# Patient Record
Sex: Male | Born: 1980
Health system: Southern US, Community
[De-identification: ages and names within clinical notes are randomized; demographics above are authoritative.]

## PROBLEM LIST (undated history)

## (undated) DIAGNOSIS — F1010 Alcohol abuse, uncomplicated: Secondary | ICD-10-CM

## (undated) DIAGNOSIS — Z8739 Personal history of other diseases of the musculoskeletal system and connective tissue: Secondary | ICD-10-CM

---

## 2011-12-28 ENCOUNTER — Other Ambulatory Visit: Payer: Self-pay | Admitting: Orthopedic Surgery

## 2012-01-10 ENCOUNTER — Encounter (HOSPITAL_BASED_OUTPATIENT_CLINIC_OR_DEPARTMENT_OTHER): Admission: RE | Payer: Self-pay | Source: Ambulatory Visit

## 2012-01-10 ENCOUNTER — Ambulatory Visit (HOSPITAL_BASED_OUTPATIENT_CLINIC_OR_DEPARTMENT_OTHER): Admission: RE | Admit: 2012-01-10 | Payer: Self-pay | Source: Ambulatory Visit | Admitting: Orthopedic Surgery

## 2012-01-10 SURGERY — ARTHROSCOPY, SHOULDER, WITH GLENOID LABRUM REPAIR
Anesthesia: Choice | Site: Shoulder | Laterality: Right

## 2015-04-03 ENCOUNTER — Emergency Department
Admission: EM | Admit: 2015-04-03 | Discharge: 2015-04-03 | Disposition: A | Discharge status: Home / Self Care | Payer: BLUE CROSS/BLUE SHIELD | Attending: Emergency Medicine | Admitting: Emergency Medicine

## 2015-04-03 ENCOUNTER — Emergency Department: Payer: BLUE CROSS/BLUE SHIELD

## 2015-04-03 DIAGNOSIS — Y929 Unspecified place or not applicable: Secondary | ICD-10-CM | POA: Insufficient documentation

## 2015-04-03 DIAGNOSIS — S43034A Inferior dislocation of right humerus, initial encounter: Secondary | ICD-10-CM | POA: Diagnosis not present

## 2015-04-03 DIAGNOSIS — Y9301 Activity, walking, marching and hiking: Secondary | ICD-10-CM | POA: Diagnosis not present

## 2015-04-03 DIAGNOSIS — W109XXA Fall (on) (from) unspecified stairs and steps, initial encounter: Secondary | ICD-10-CM | POA: Diagnosis not present

## 2015-04-03 DIAGNOSIS — Y999 Unspecified external cause status: Secondary | ICD-10-CM | POA: Diagnosis not present

## 2015-04-03 DIAGNOSIS — S43004A Unspecified dislocation of right shoulder joint, initial encounter: Secondary | ICD-10-CM

## 2015-04-03 DIAGNOSIS — M25511 Pain in right shoulder: Secondary | ICD-10-CM | POA: Diagnosis present

## 2015-04-03 MED ORDER — PROPOFOL 10 MG/ML IV BOLUS
0.5000 mg/kg | Freq: Once | INTRAVENOUS | Status: AC
  Administered 2015-04-03 (×2): 49.9 mg via INTRAVENOUS

## 2015-04-03 MED ORDER — SODIUM CHLORIDE 0.9 % IV BOLUS (SEPSIS)
1000.0000 mL | Freq: Once | INTRAVENOUS | Status: DC
Start: 2015-04-03 — End: 2015-04-04

## 2015-04-03 MED ORDER — PROPOFOL 1000 MG/100ML IV EMUL
INTRAVENOUS | Status: AC
  Administered 2015-04-03: 49.9 mg via INTRAVENOUS
  Filled 2015-04-03: qty 100

## 2015-04-03 MED ORDER — SODIUM CHLORIDE 0.9 % IV SOLN
INTRAVENOUS | Status: AC | PRN
  Administered 2015-04-03: 1000 mL via INTRAVENOUS

## 2015-04-03 MED ORDER — ONDANSETRON HCL 4 MG/2ML IJ SOLN
4.0000 mg | Freq: Once | INTRAMUSCULAR | Status: AC
  Administered 2015-04-03: 4 mg via INTRAVENOUS
  Filled 2015-04-03: qty 2

## 2015-04-03 MED ORDER — HYDROMORPHONE HCL 1 MG/ML IJ SOLN
1.0000 mg | INTRAMUSCULAR | Status: AC
  Administered 2015-04-03: 1 mg via INTRAVENOUS
  Filled 2015-04-03: qty 1

## 2015-04-03 MED ORDER — KETAMINE HCL 10 MG/ML IJ SOLN: 1.0000 mg/kg | Freq: Once | INTRAMUSCULAR | Status: DC

## 2015-04-03 NOTE — ED Notes (Signed)
Pt reports falling and catching himself, pt has a deformity in his right shoulder.

## 2015-04-03 NOTE — Sedation Documentation (Signed)
MD able to reduce R shoulder, pt tolerated well. Pt remained awake and alert through procedure. Patient is speaking clearly and is A&O x 4.

## 2015-04-03 NOTE — ED Provider Notes (Signed)
[facility] Emergency Department Provider Note  ____________________________________________  Time seen: Approximately 9  PM  I have reviewed the triage vital signs and the nursing notes.   HISTORY  Chief Complaint Shoulder Pain    HPI Andrew Cummings is a 35 y.o. male with a history of multiple shoulder dislocations on the right side was presenting tonight with shoulder dislocation. He says the last time he dislocated it was about 1 year ago. He says he has dislocated it greater than 5 times over his lifetime. He says he has not gotten surgery for this because he said he used to dislocated more frequently when it is playing sports. However, since he was not athletic anymore he decided not to proceed with surgery. He says that about an hour prior to arrival tonight he felt himself slipping downstairs and reached backward to brace himself when he felt his shoulder pop out. He says he tried to force it back in but was unable to. He says that every time he has had a dislocation in the past he has been able to place it back in himself.   No past medical history on file.  There are no active problems to display for this patient.   No past surgical history on file.  No current outpatient prescriptions on file.  Allergies Review of patient's allergies indicates no known allergies.  No family history on file.  Social History Social History  Substance Use Topics  . Smoking status: Not on file  . Smokeless tobacco: Not on file  . Alcohol Use: Not on file    Review of Systems Constitutional: No fever/chills Eyes: No visual changes. ENT: No sore throat. Cardiovascular: Denies chest pain. Respiratory: Denies shortness of breath. Gastrointestinal: No abdominal pain.  No nausea, no vomiting.  No diarrhea.  No constipation. Genitourinary: Negative for dysuria. Musculoskeletal: Negative for back pain. Skin: Negative for rash. Neurological: Negative for  headaches, focal weakness or numbness.  10-point ROS otherwise negative.  ____________________________________________   PHYSICAL EXAM:  VITAL SIGNS: ED Triage Vitals  Enc Vitals Group     BP 04/03/15 2053 141/100 mmHg     Pulse Rate 04/03/15 2053 111     Resp 04/03/15 2053 18     Temp 04/03/15 2053 96.8 F (36 C)     Temp Source 04/03/15 2053 Oral     SpO2 04/03/15 2053 98 %     Weight 04/03/15 2053 220 lb (99.791 kg)     Height 04/03/15 2053  (1.803 m)     Head Cir --      Peak Flow --      Pain Score 04/03/15 2053 10     Pain Loc --      Pain Edu? --      Excl. in GC? --     Constitutional: Alert and oriented.  Mild to moderate distress. Holding his right elbow with his left arm. Eyes: Conjunctivae are normal. PERRL. EOMI. Head: Atraumatic. Nose: No congestion/rhinnorhea. Mouth/Throat: Mucous membranes are moist.  Neck: No stridor.   Cardiovascular: Normal rate, regular rhythm. Grossly normal heart sounds.  Good peripheral circulation. Respiratory: Normal respiratory effort.  No retractions. Lungs CTAB. Gastrointestinal: Soft and nontender. No distention.  Musculoskeletal: Obvious deformity to the right shoulder. He is sensate over the right deltoid. Right radial pulse is intact. Able to grip with full strength with the right hand. Neurologic:  Normal speech and language. No gross focal neurologic deficits are appreciated. No gait instability. Skin:  Skin is warm, dry and intact. No rash noted. Psychiatric: Mood and affect are normal. Speech and behavior are normal.  ____________________________________________   LABS (all labs ordered are listed, but only abnormal results are displayed)  Labs Reviewed - No data to display ____________________________________________  EKG   ____________________________________________  RADIOLOGY   IMPRESSION: Normal alignment at the glenohumeral joint space status post reduction.   Electronically Signed By:  Bary RichardStan Maynard M.D. On: 04/03/2015 22:46  IMPRESSION: Anterior inferior dislocation of right shoulder.   Electronically Signed By: Sherian ReinWei-Chen Lin M.D. On: 04/03/2015 21:44  ____________________________________________   PROCEDURES  Procedural sedation Performed by: Arelia LongestSchaevitz,  Danaja Lasota M Consent: Written consent obtained.  Risks and benefits: risks, benefits and alternatives were discussed Required items: required blood products, implants, devices, and special equipment available Patient identity confirmed: arm band and provided demographic data Time out: Immediately prior to procedure a "time out" was called to verify the correct patient, procedure, equipment, support staff and site/side marked as required.  Sedation type: moderate (conscious) sedation NPO time confirmed and considedered  Sedatives: Propofol   Physician Time at Bedside: 30  Vitals: Vital signs were monitored during sedation. Cardiac Monitor, pulse oximeter Patient tolerance: Patient tolerated the procedure well with no immediate complications. Comments: Pt with uneventful recovered. Returned to pre-procedural sedation baseline  Reduction of dislocation Date/Time: 2200 Performed by: Arelia LongestSchaevitz,  Odalys Win M Authorized by: Arelia LongestSchaevitz,  Chyane Greer M Consent: Written consent obtained.  Risks and benefits: risks, benefits and alternatives were discussed Consent given by: patient Required items: required blood products, implants, devices, and special equipment available Time out: Immediately prior to procedure a "time out" was called to verify the correct patient, procedure, equipment, support staff and site/side marked as required.  Patient sedated: With propofol  Vitals: Vital signs were monitored during sedation. Patient tolerance: Patient tolerated the procedure well with no immediate complications. Joint: Right shoulder Reduction technique: Modified [place]      ____________________________________________   INITIAL IMPRESSION / ASSESSMENT AND PLAN / ED COURSE  Pertinent labs & imaging results that were available during my care of the patient were reviewed by me and considered in my medical decision making (see chart for details).  ----------------------------------------- 11:04 PM on 04/03/2015 -----------------------------------------  Patient tolerated the procedure well was placed in a right-sided shoulder mobilizer. He is completely neurovascularly intact and comfortable. I'll be giving him follow-up with orthopedics. He knows that he must not take of the shoulder mobilizer until he follows up with the orthopedic doctor. Will be discharged home. Understands the plan and willing to comply. ____________________________________________   FINAL CLINICAL IMPRESSION(S) / ED DIAGNOSES  Right shoulder dislocation.    Myrna Blazeravid Matthew Gwendolyn Nishi, MD 04/03/15 [phone]

## 2015-04-03 NOTE — Discharge Instructions (Signed)
Shoulder Dislocation Your shoulder joint is made up of 3 bones:  The upper arm bone (humerus).  The shoulder blade (scapula).  The collarbone (clavicle). A shoulder dislocation happens when your upper arm bone moves out of its normal place in your shoulder joint. HOME CARE If You Have a Splint or Sling:  Wear it as told by your doctor.  Take it off only as told by your doctor.  Loosen it if:  Your fingers become numb and tingly.  Your fingers turn cold and blue.  Keep it clean and dry. Bathing  Do not take baths, swim, or use a hot tub until your doctor says you can. Ask your doctor if you can take showers. You may only be allowed to take sponge baths.  If your doctor says taking baths or showers is okay, cover your splint or sling with a plastic bag. Do not let the splint or sling get wet. Managing Pain, Stiffness, and Swelling  If told, put ice on the injured area.  Put ice in a plastic bag.  Place a towel between your skin and the bag.  Leave the ice on for 20 minutes, 2-3 times per day.  Move your fingers often to avoid stiffness and to lessen swelling.  Raise (elevate) the injured area above the level of your heart while you are sitting or lying down. Driving  Do not drive while you are wearing a splint or sling on a hand that you use for driving.  Do not drive or operate heavy machinery while taking pain medicine. Activity  Return to your normal activities as told by your doctor. Ask your doctor what activities are safe for you.  Do range-of-motion exercises only as told by your doctor.  Exercise your hand by squeezing a soft ball. This keeps your hand and wrist from getting stiff and swollen. General Instructions  Take over-the-counter and prescription medicines only as told by your doctor.  Do not use any tobacco products, including cigarettes, chewing tobacco, or e-cigarettes. Tobacco can slow down healing. If you need help quitting, ask your  doctor.  Keep all follow-up visits as told by your doctor. This is important. GET HELP IF:  Your splint or sling gets damaged. GET HELP RIGHT AWAY IF:  Your pain gets worse instead of better.  You lose feeling in your arm or hand.  Your arm or hand turns white and cold.   This information is not intended to replace advice given to you by your health care provider. Make sure you discuss any questions you have with your health care provider.   Document Released: 03/29/2011 Document Revised: 09/25/2014 Document Reviewed: 04/29/2014 Elsevier Interactive Patient Education 2016 [employer].  How to Use a Shoulder Immobilizer A shoulder immobilizer is a device that you may have to wear after a shoulder injury or surgery. This device keeps your arm from moving. This prevents additional pain or injury. It also supports your arm next to your body as your shoulder heals. You may need to wear a shoulder immobilizer to treat a broken bone (fracture) in your shoulder. You may also need to wear one if you have an injury that moves your shoulder out of position (dislocation). There are different types of shoulder immobilizers. The one that you get depends on your injury. RISKS AND COMPLICATIONS Wearing a shoulder immobilizer in the wrong way can let your injured shoulder move around too much. This may delay healing and make your pain and swelling worse. HOW TO USE  YOUR SHOULDER IMMOBILIZER °· The part of the immobilizer that goes around your neck (sling) should support your upper arm, with your elbow bent and your lower arm and hand across your chest. °· Make sure that your elbow: °¨ Is snug against the back pocket of the sling. °¨ Does not move away from your body. °· The strap of the immobilizer should go over your shoulder and support your arm and hand. Your hand should be slightly higher than your elbow. It should not hang loosely over the edge of the sling. °· If the long strap has a pad, place it  where it is most comfortable on your neck. °· Carefully follow your health care provider's instructions for wearing your shoulder immobilizer. Your health care provider may want you to: °¨ Loosen your immobilizer to straighten your elbow and move your wrist and fingers. You may have to do this several times each day. Ask your health care provider when you should do this and how often. °¨ Remove your immobilizer once every day to shower, but limit the movement in your injured arm. Before putting the immobilizer back on, use a towel to dry the area under your arm completely. °¨ Remove your immobilizer to do shoulder exercises at home as directed by your health care provider. °¨ Wear your immobilizer while you sleep. You may sleep more comfortably if you have your upper body raised on pillows. °SEEK MEDICAL CARE IF: °· Your immobilizer is not supporting your arm properly. °· Your immobilizer gets damaged. °· You have worsening pain or swelling in your shoulder, arm, or hand. °· Your shoulder, arm, or hand changes color or temperature. °· You lose feeling in your shoulder, arm, or hand. °  °This information is not intended to replace advice given to you by your health care provider. Make sure you discuss any questions you have with your health care provider. °  °Document Released: 02/12/2004 Document Revised: 05/21/2014 Document Reviewed: 12/12/2013 °Elsevier Interactive Patient Education ©2016 [employer] ° °

## 2015-04-03 NOTE — ED Notes (Signed)
Pt in with co right shoulder dislocation x 1 hr hx of the same.

## 2016-01-05 DIAGNOSIS — T753XXA Motion sickness, initial encounter: Secondary | ICD-10-CM | POA: Diagnosis not present

## 2016-01-05 DIAGNOSIS — B369 Superficial mycosis, unspecified: Secondary | ICD-10-CM | POA: Diagnosis not present

## 2016-01-05 DIAGNOSIS — Z113 Encounter for screening for infections with a predominantly sexual mode of transmission: Secondary | ICD-10-CM | POA: Diagnosis not present

## 2016-10-14 DIAGNOSIS — L309 Dermatitis, unspecified: Secondary | ICD-10-CM | POA: Diagnosis not present

## 2016-10-14 DIAGNOSIS — R109 Unspecified abdominal pain: Secondary | ICD-10-CM | POA: Diagnosis not present

## 2016-10-14 DIAGNOSIS — R112 Nausea with vomiting, unspecified: Secondary | ICD-10-CM | POA: Diagnosis not present

## 2016-10-14 DIAGNOSIS — Z23 Encounter for immunization: Secondary | ICD-10-CM | POA: Diagnosis not present

## 2017-02-16 DIAGNOSIS — H40023 Open angle with borderline findings, high risk, bilateral: Secondary | ICD-10-CM | POA: Diagnosis not present

## 2017-03-30 DIAGNOSIS — M545 Low back pain: Secondary | ICD-10-CM | POA: Diagnosis not present

## 2017-03-30 DIAGNOSIS — M9903 Segmental and somatic dysfunction of lumbar region: Secondary | ICD-10-CM | POA: Diagnosis not present

## 2017-06-16 ENCOUNTER — Emergency Department (HOSPITAL_COMMUNITY)
Admission: EM | Admit: 2017-06-16 | Discharge: 2017-06-16 | Discharge status: Absent without leave | Payer: BLUE CROSS/BLUE SHIELD

## 2018-02-08 DIAGNOSIS — H40023 Open angle with borderline findings, high risk, bilateral: Secondary | ICD-10-CM | POA: Diagnosis not present

## 2018-03-21 DIAGNOSIS — H40023 Open angle with borderline findings, high risk, bilateral: Secondary | ICD-10-CM | POA: Diagnosis not present

## 2018-08-25 DIAGNOSIS — R05 Cough: Secondary | ICD-10-CM | POA: Diagnosis not present

## 2018-08-25 DIAGNOSIS — Z20828 Contact with and (suspected) exposure to other viral communicable diseases: Secondary | ICD-10-CM | POA: Diagnosis not present

## 2018-10-05 DIAGNOSIS — K219 Gastro-esophageal reflux disease without esophagitis: Secondary | ICD-10-CM | POA: Diagnosis not present

## 2018-10-05 DIAGNOSIS — B369 Superficial mycosis, unspecified: Secondary | ICD-10-CM | POA: Diagnosis not present

## 2018-10-25 DIAGNOSIS — Z0000 Encounter for general adult medical examination without abnormal findings: Secondary | ICD-10-CM | POA: Diagnosis not present

## 2018-10-26 DIAGNOSIS — B302 Viral pharyngoconjunctivitis: Secondary | ICD-10-CM | POA: Diagnosis not present

## 2018-11-16 DIAGNOSIS — Z113 Encounter for screening for infections with a predominantly sexual mode of transmission: Secondary | ICD-10-CM | POA: Diagnosis not present

## 2018-11-16 DIAGNOSIS — Z20828 Contact with and (suspected) exposure to other viral communicable diseases: Secondary | ICD-10-CM | POA: Diagnosis not present

## 2019-03-31 DIAGNOSIS — Z20828 Contact with and (suspected) exposure to other viral communicable diseases: Secondary | ICD-10-CM | POA: Diagnosis not present

## 2019-04-19 DIAGNOSIS — B369 Superficial mycosis, unspecified: Secondary | ICD-10-CM | POA: Diagnosis not present

## 2019-04-19 DIAGNOSIS — K219 Gastro-esophageal reflux disease without esophagitis: Secondary | ICD-10-CM | POA: Diagnosis not present

## 2019-04-19 DIAGNOSIS — Z7189 Other specified counseling: Secondary | ICD-10-CM | POA: Diagnosis not present

## 2019-04-25 DIAGNOSIS — Z20828 Contact with and (suspected) exposure to other viral communicable diseases: Secondary | ICD-10-CM | POA: Diagnosis not present

## 2019-09-02 DIAGNOSIS — E785 Hyperlipidemia, unspecified: Secondary | ICD-10-CM | POA: Diagnosis not present

## 2019-09-02 DIAGNOSIS — Z131 Encounter for screening for diabetes mellitus: Secondary | ICD-10-CM | POA: Diagnosis not present

## 2019-09-02 DIAGNOSIS — Z713 Dietary counseling and surveillance: Secondary | ICD-10-CM | POA: Diagnosis not present

## 2019-09-02 DIAGNOSIS — R030 Elevated blood-pressure reading, without diagnosis of hypertension: Secondary | ICD-10-CM | POA: Diagnosis not present

## 2019-09-02 DIAGNOSIS — Z136 Encounter for screening for cardiovascular disorders: Secondary | ICD-10-CM | POA: Diagnosis not present

## 2019-09-02 DIAGNOSIS — Z13220 Encounter for screening for lipoid disorders: Secondary | ICD-10-CM | POA: Diagnosis not present

## 2019-09-02 DIAGNOSIS — Z0130 Encounter for examination of blood pressure without abnormal findings: Secondary | ICD-10-CM | POA: Diagnosis not present

## 2019-12-27 DIAGNOSIS — Z20822 Contact with and (suspected) exposure to covid-19: Secondary | ICD-10-CM | POA: Diagnosis not present

## 2020-06-03 DIAGNOSIS — F411 Generalized anxiety disorder: Secondary | ICD-10-CM | POA: Diagnosis not present

## 2020-06-09 DIAGNOSIS — F411 Generalized anxiety disorder: Secondary | ICD-10-CM | POA: Diagnosis not present

## 2020-06-29 ENCOUNTER — Inpatient Hospital Stay
Admission: EM | Admit: 2020-06-29 | Discharge: 2020-07-01 | DRG: 897 | Disposition: A | Discharge status: Home / Self Care | Payer: BC Managed Care – PPO | Attending: Internal Medicine | Admitting: Internal Medicine

## 2020-06-29 ENCOUNTER — Inpatient Hospital Stay: Payer: BC Managed Care – PPO

## 2020-06-29 ENCOUNTER — Other Ambulatory Visit: Payer: Self-pay

## 2020-06-29 ENCOUNTER — Emergency Department: Payer: BC Managed Care – PPO

## 2020-06-29 DIAGNOSIS — F10239 Alcohol dependence with withdrawal, unspecified: Secondary | ICD-10-CM | POA: Diagnosis not present

## 2020-06-29 DIAGNOSIS — S4981XA Other specified injuries of right shoulder and upper arm, initial encounter: Secondary | ICD-10-CM | POA: Diagnosis not present

## 2020-06-29 DIAGNOSIS — Z20822 Contact with and (suspected) exposure to covid-19: Secondary | ICD-10-CM | POA: Diagnosis present

## 2020-06-29 DIAGNOSIS — M25511 Pain in right shoulder: Secondary | ICD-10-CM | POA: Diagnosis not present

## 2020-06-29 DIAGNOSIS — K760 Fatty (change of) liver, not elsewhere classified: Secondary | ICD-10-CM | POA: Diagnosis not present

## 2020-06-29 DIAGNOSIS — S01512S Laceration without foreign body of oral cavity, sequela: Secondary | ICD-10-CM | POA: Diagnosis not present

## 2020-06-29 DIAGNOSIS — S42291A Other displaced fracture of upper end of right humerus, initial encounter for closed fracture: Secondary | ICD-10-CM | POA: Diagnosis present

## 2020-06-29 DIAGNOSIS — S43004S Unspecified dislocation of right shoulder joint, sequela: Secondary | ICD-10-CM | POA: Diagnosis not present

## 2020-06-29 DIAGNOSIS — M25311 Other instability, right shoulder: Secondary | ICD-10-CM | POA: Diagnosis not present

## 2020-06-29 DIAGNOSIS — D696 Thrombocytopenia, unspecified: Secondary | ICD-10-CM | POA: Diagnosis not present

## 2020-06-29 DIAGNOSIS — G35 Multiple sclerosis: Secondary | ICD-10-CM | POA: Diagnosis not present

## 2020-06-29 DIAGNOSIS — Y92009 Unspecified place in unspecified non-institutional (private) residence as the place of occurrence of the external cause: Secondary | ICD-10-CM | POA: Diagnosis not present

## 2020-06-29 DIAGNOSIS — S01512A Laceration without foreign body of oral cavity, initial encounter: Secondary | ICD-10-CM

## 2020-06-29 DIAGNOSIS — M24411 Recurrent dislocation, right shoulder: Secondary | ICD-10-CM | POA: Diagnosis not present

## 2020-06-29 DIAGNOSIS — R7401 Elevation of levels of liver transaminase levels: Secondary | ICD-10-CM

## 2020-06-29 DIAGNOSIS — R7989 Other specified abnormal findings of blood chemistry: Secondary | ICD-10-CM | POA: Diagnosis not present

## 2020-06-29 DIAGNOSIS — E872 Acidosis: Secondary | ICD-10-CM

## 2020-06-29 DIAGNOSIS — E876 Hypokalemia: Secondary | ICD-10-CM | POA: Diagnosis not present

## 2020-06-29 DIAGNOSIS — R569 Unspecified convulsions: Secondary | ICD-10-CM | POA: Diagnosis not present

## 2020-06-29 DIAGNOSIS — M24412 Recurrent dislocation, left shoulder: Secondary | ICD-10-CM | POA: Diagnosis not present

## 2020-06-29 DIAGNOSIS — I10 Essential (primary) hypertension: Secondary | ICD-10-CM | POA: Diagnosis not present

## 2020-06-29 DIAGNOSIS — Z23 Encounter for immunization: Secondary | ICD-10-CM

## 2020-06-29 DIAGNOSIS — S43004A Unspecified dislocation of right shoulder joint, initial encounter: Secondary | ICD-10-CM | POA: Diagnosis not present

## 2020-06-29 DIAGNOSIS — M19011 Primary osteoarthritis, right shoulder: Secondary | ICD-10-CM | POA: Diagnosis not present

## 2020-06-29 DIAGNOSIS — R404 Transient alteration of awareness: Secondary | ICD-10-CM | POA: Diagnosis not present

## 2020-06-29 DIAGNOSIS — Y900 Blood alcohol level of less than 20 mg/100 ml: Secondary | ICD-10-CM | POA: Diagnosis present

## 2020-06-29 DIAGNOSIS — D6959 Other secondary thrombocytopenia: Secondary | ICD-10-CM | POA: Diagnosis not present

## 2020-06-29 DIAGNOSIS — M25312 Other instability, left shoulder: Secondary | ICD-10-CM | POA: Diagnosis not present

## 2020-06-29 DIAGNOSIS — R945 Abnormal results of liver function studies: Secondary | ICD-10-CM | POA: Diagnosis not present

## 2020-06-29 DIAGNOSIS — R1111 Vomiting without nausea: Secondary | ICD-10-CM | POA: Diagnosis not present

## 2020-06-29 DIAGNOSIS — S4291XA Fracture of right shoulder girdle, part unspecified, initial encounter for closed fracture: Secondary | ICD-10-CM

## 2020-06-29 DIAGNOSIS — X58XXXA Exposure to other specified factors, initial encounter: Secondary | ICD-10-CM | POA: Diagnosis present

## 2020-06-29 DIAGNOSIS — E8729 Other acidosis: Secondary | ICD-10-CM

## 2020-06-29 DIAGNOSIS — F10230 Alcohol dependence with withdrawal, uncomplicated: Secondary | ICD-10-CM

## 2020-06-29 DIAGNOSIS — S43014A Anterior dislocation of right humerus, initial encounter: Secondary | ICD-10-CM | POA: Diagnosis not present

## 2020-06-29 DIAGNOSIS — R109 Unspecified abdominal pain: Secondary | ICD-10-CM

## 2020-06-29 DIAGNOSIS — K2970 Gastritis, unspecified, without bleeding: Secondary | ICD-10-CM | POA: Diagnosis present

## 2020-06-29 DIAGNOSIS — T148XXA Other injury of unspecified body region, initial encounter: Secondary | ICD-10-CM

## 2020-06-29 DIAGNOSIS — R112 Nausea with vomiting, unspecified: Secondary | ICD-10-CM | POA: Diagnosis not present

## 2020-06-29 DIAGNOSIS — R9431 Abnormal electrocardiogram [ECG] [EKG]: Secondary | ICD-10-CM | POA: Diagnosis not present

## 2020-06-29 DIAGNOSIS — M21921 Unspecified acquired deformity of right upper arm: Secondary | ICD-10-CM | POA: Diagnosis not present

## 2020-06-29 DIAGNOSIS — S43015A Anterior dislocation of left humerus, initial encounter: Secondary | ICD-10-CM | POA: Diagnosis not present

## 2020-06-29 DIAGNOSIS — F10939 Alcohol use, unspecified with withdrawal, unspecified: Secondary | ICD-10-CM

## 2020-06-29 HISTORY — DX: Personal history of other diseases of the musculoskeletal system and connective tissue: Z87.39

## 2020-06-29 LAB — COMPREHENSIVE METABOLIC PANEL
ALT: 107 U/L — ABNORMAL HIGH (ref 0–44)
AST: 282 U/L — ABNORMAL HIGH (ref 15–41)
Albumin: 3.6 g/dL (ref 3.5–5.0)
Alkaline Phosphatase: 173 U/L — ABNORMAL HIGH (ref 38–126)
Anion gap: 18 — ABNORMAL HIGH (ref 5–15)
BUN: 8 mg/dL (ref 6–20)
CO2: 17 mmol/L — ABNORMAL LOW (ref 22–32)
Calcium: 8.5 mg/dL — ABNORMAL LOW (ref 8.9–10.3)
Chloride: 100 mmol/L (ref 98–111)
Creatinine, Ser: 0.88 mg/dL (ref 0.61–1.24)
GFR, Estimated: >60 mL/min (ref >60)
Glucose, Bld: 117 mg/dL — ABNORMAL HIGH (ref 70–99)
Potassium: 3.4 mmol/L — ABNORMAL LOW (ref 3.5–5.1)
Sodium: 135 mmol/L (ref 135–145)
Total Bilirubin: 3 mg/dL — ABNORMAL HIGH (ref 0.3–1.2)
Total Protein: 7.6 g/dL (ref 6.5–8.1)

## 2020-06-29 LAB — CBC WITH DIFFERENTIAL/PLATELET
Abs Immature Granulocytes: 0.09 10*3/uL — ABNORMAL HIGH (ref 0.00–0.07)
Basophils Absolute: 0.1 10*3/uL (ref 0.0–0.1)
Basophils Relative: 1 %
Eosinophils Absolute: 0 10*3/uL (ref 0.0–0.5)
Eosinophils Relative: 0 %
HCT: 40.3 % (ref 39.0–52.0)
Hemoglobin: 13.8 g/dL (ref 13.0–17.0)
Immature Granulocytes: 1 %
Lymphocytes Relative: 30 %
Lymphs Abs: 2.9 10*3/uL (ref 0.7–4.0)
MCH: 33.4 pg (ref 26.0–34.0)
MCHC: 34.2 g/dL (ref 30.0–36.0)
MCV: 97.6 fL (ref 80.0–100.0)
Monocytes Absolute: 0.6 10*3/uL (ref 0.1–1.0)
Monocytes Relative: 6 %
Neutro Abs: 6.1 10*3/uL (ref 1.7–7.7)
Neutrophils Relative %: 62 %
Platelets: 149 10*3/uL — ABNORMAL LOW (ref 150–400)
RBC: 4.13 MIL/uL — ABNORMAL LOW (ref 4.22–5.81)
RDW: 14.4 % (ref 11.5–15.5)
WBC: 9.7 10*3/uL (ref 4.0–10.5)
nRBC: 0 % (ref 0.0–0.2)

## 2020-06-29 LAB — URINE DRUG SCREEN, QUALITATIVE (ARMC ONLY)
Amphetamines, Ur Screen: NOT DETECTED
Barbiturates, Ur Screen: NOT DETECTED
Benzodiazepine, Ur Scrn: POSITIVE — AB
Cannabinoid 50 Ng, Ur ~~LOC~~: NOT DETECTED
Cocaine Metabolite,Ur ~~LOC~~: NOT DETECTED
MDMA (Ecstasy)Ur Screen: NOT DETECTED
Methadone Scn, Ur: NOT DETECTED
Opiate, Ur Screen: POSITIVE — AB
Phencyclidine (PCP) Ur S: NOT DETECTED
Tricyclic, Ur Screen: NOT DETECTED

## 2020-06-29 LAB — HIV ANTIBODY (ROUTINE TESTING W REFLEX): HIV Screen 4th Generation wRfx: NONREACTIVE

## 2020-06-29 LAB — ETHANOL: Alcohol, Ethyl (B): <10 mg/dL (ref <10)

## 2020-06-29 LAB — RESP PANEL BY RT-PCR (FLU A&B, COVID) ARPGX2
Influenza A by PCR: NEGATIVE
Influenza B by PCR: NEGATIVE
SARS Coronavirus 2 by RT PCR: NEGATIVE

## 2020-06-29 LAB — LIPASE, BLOOD: Lipase: 50 U/L (ref 11–51)

## 2020-06-29 LAB — PHOSPHORUS: Phosphorus: 3.9 mg/dL (ref 2.5–4.6)

## 2020-06-29 LAB — MAGNESIUM: Magnesium: 1.4 mg/dL — ABNORMAL LOW (ref 1.7–2.4)

## 2020-06-29 MED ORDER — ACETAMINOPHEN 325 MG PO TABS
650.0000 mg | ORAL_TABLET | ORAL | Status: DC | PRN
  Administered 2020-06-29 – 2020-07-01 (×8): 650 mg via ORAL
  Filled 2020-06-29 (×8): qty 2

## 2020-06-29 MED ORDER — LORAZEPAM 2 MG/ML IJ SOLN
0.0000 mg | Freq: Two times a day (BID) | INTRAMUSCULAR | Status: DC
Start: 2020-07-01 — End: 2020-07-01

## 2020-06-29 MED ORDER — ONDANSETRON HCL 4 MG/2ML IJ SOLN: 4.0000 mg | Freq: Four times a day (QID) | INTRAMUSCULAR | Status: DC | PRN

## 2020-06-29 MED ORDER — MAGNESIUM SULFATE 2 GM/50ML IV SOLN
2.0000 g | Freq: Once | INTRAVENOUS | Status: AC
  Administered 2020-06-29: 2 g via INTRAVENOUS
  Filled 2020-06-29: qty 50

## 2020-06-29 MED ORDER — ADULT MULTIVITAMIN W/MINERALS CH
1.0000 | ORAL_TABLET | Freq: Every day | ORAL | Status: DC
  Administered 2020-06-29 – 2020-07-01 (×3): 1 via ORAL
  Filled 2020-06-29 (×3): qty 1

## 2020-06-29 MED ORDER — GADOBUTROL 1 MMOL/ML IV SOLN
10.0000 mL | Freq: Once | INTRAVENOUS | Status: AC | PRN
  Administered 2020-06-29: 10 mL via INTRAVENOUS

## 2020-06-29 MED ORDER — FOLIC ACID 1 MG PO TABS
1.0000 mg | ORAL_TABLET | Freq: Every day | ORAL | Status: DC
  Administered 2020-06-29 – 2020-07-01 (×3): 1 mg via ORAL
  Filled 2020-06-29 (×3): qty 1

## 2020-06-29 MED ORDER — HYDROMORPHONE HCL 1 MG/ML IJ SOLN
1.0000 mg | INTRAMUSCULAR | Status: AC
  Administered 2020-06-29: 1 mg via INTRAVENOUS
  Filled 2020-06-29: qty 1

## 2020-06-29 MED ORDER — ACETAMINOPHEN 650 MG RE SUPP: 650.0000 mg | RECTAL | Status: DC | PRN

## 2020-06-29 MED ORDER — DEXTROSE-NACL 5-0.9 % IV SOLN: INTRAVENOUS | Status: DC

## 2020-06-29 MED ORDER — SODIUM CHLORIDE 0.9 % IV SOLN: 75.0000 mL/h | INTRAVENOUS | Status: DC

## 2020-06-29 MED ORDER — LORAZEPAM 2 MG/ML IJ SOLN: 1.0000 mg | INTRAMUSCULAR | Status: DC | PRN

## 2020-06-29 MED ORDER — LORAZEPAM 2 MG/ML IJ SOLN: 0.0000 mg | Freq: Four times a day (QID) | INTRAMUSCULAR | Status: AC

## 2020-06-29 MED ORDER — ONDANSETRON HCL 4 MG PO TABS: 4.0000 mg | ORAL_TABLET | Freq: Four times a day (QID) | ORAL | Status: DC | PRN

## 2020-06-29 MED ORDER — PROPOFOL 10 MG/ML IV BOLUS
INTRAVENOUS | Status: AC
  Filled 2020-06-29: qty 20

## 2020-06-29 MED ORDER — POTASSIUM CHLORIDE CRYS ER 20 MEQ PO TBCR
40.0000 meq | EXTENDED_RELEASE_TABLET | Freq: Once | ORAL | Status: AC
  Administered 2020-06-29: 40 meq via ORAL
  Filled 2020-06-29: qty 2

## 2020-06-29 MED ORDER — ENOXAPARIN SODIUM 40 MG/0.4ML IJ SOSY
40.0000 mg | PREFILLED_SYRINGE | INTRAMUSCULAR | Status: DC
  Administered 2020-06-29 – 2020-06-30 (×2): 40 mg via SUBCUTANEOUS
  Filled 2020-06-29 (×2): qty 0.4

## 2020-06-29 MED ORDER — THIAMINE HCL 100 MG/ML IJ SOLN
100.0000 mg | Freq: Once | INTRAMUSCULAR | Status: AC
  Administered 2020-06-29: 100 mg via INTRAVENOUS
  Filled 2020-06-29: qty 2

## 2020-06-29 MED ORDER — LORAZEPAM 2 MG/ML IJ SOLN
2.0000 mg | Freq: Once | INTRAMUSCULAR | Status: AC
  Administered 2020-06-29: 2 mg via INTRAVENOUS
  Filled 2020-06-29: qty 1

## 2020-06-29 MED ORDER — LORAZEPAM 1 MG PO TABS
1.0000 mg | ORAL_TABLET | ORAL | Status: DC | PRN
  Administered 2020-06-29: 1 mg via ORAL
  Filled 2020-06-29: qty 1

## 2020-06-29 MED ORDER — PNEUMOCOCCAL VAC POLYVALENT 25 MCG/0.5ML IJ INJ
0.5000 mL | INJECTION | INTRAMUSCULAR | Status: AC
  Administered 2020-06-30: 0.5 mL via INTRAMUSCULAR
  Filled 2020-06-29: qty 0.5

## 2020-06-29 MED ORDER — SENNOSIDES-DOCUSATE SODIUM 8.6-50 MG PO TABS: 1.0000 | ORAL_TABLET | Freq: Every evening | ORAL | Status: DC | PRN

## 2020-06-29 MED ORDER — THIAMINE HCL 100 MG/ML IJ SOLN: 100.0000 mg | Freq: Every day | INTRAMUSCULAR | Status: DC

## 2020-06-29 MED ORDER — PROPOFOL 10 MG/ML IV BOLUS
INTRAVENOUS | Status: AC | PRN
  Administered 2020-06-29: 100 mg via INTRAVENOUS
  Administered 2020-06-29: 50 mg via INTRAVENOUS

## 2020-06-29 MED ORDER — PANTOPRAZOLE SODIUM 40 MG IV SOLR
40.0000 mg | INTRAVENOUS | Status: DC
  Administered 2020-06-29 – 2020-06-30 (×2): 40 mg via INTRAVENOUS
  Filled 2020-06-29 (×2): qty 40

## 2020-06-29 MED ORDER — THIAMINE HCL 100 MG PO TABS
100.0000 mg | ORAL_TABLET | Freq: Every day | ORAL | Status: DC
  Administered 2020-06-29 – 2020-07-01 (×3): 100 mg via ORAL
  Filled 2020-06-29 (×3): qty 1

## 2020-06-29 MED ORDER — LORAZEPAM 2 MG/ML IJ SOLN
1.0000 mg | INTRAMUSCULAR | Status: DC | PRN
Start: 2020-06-29 — End: 2020-07-01

## 2020-06-29 NOTE — ED Provider Notes (Signed)
[facility] Emergency Department Provider Note  ____________________________________________   Event Date/Time   First MD Initiated Contact with Patient 06/29/20 0127     (approximate)  I have reviewed the triage vital signs and the nursing notes.   HISTORY  Chief Complaint Seizures and Shoulder Injury    HPI Andrew Cummings is a 40 y.o. male with medical history as listed below who presents by EMS after an apparent seizure at home.  According to first the paramedics and then confirmed by the patient, he has been drinking heavily recently but then stopped drinking within the last 24 to 48 hours because he said he realized it was a problem for him.  He has no prior seizure history but his wife observed him having generalized seizure-like activity in bed tonight.  He has a history of bilateral shoulder dislocations and says that he has been told he needs surgery but he is "stubborn" and has not done anything about it.  He said that typically he can prevent the dislocation for happening or put him back and after it happens but obviously he was not able to do so because of the seizure and he has acute and severe pain in his right shoulder with an obvious deformity and says that it feels dislocated to him.  No numbness or tingling in the hand.  Patient is currently feeling better but was initially confused and seemed postictal after the seizure-like episode.  He is denying chest pain and abdominal pain and shortness of breath.  He says his last drink was more than 24 hours ago.  The onset of the episode was acute and severe nothing particular made it better or worse.     Past Medical History:  Diagnosis Date   History of closed shoulder dislocation    bilateral    Patient Active Problem List   Diagnosis Date Noted   Alcohol withdrawal seizure (HCC) 06/29/2020   Alcoholic ketoacidosis 06/29/2020   Dislocation of right shoulder joint 06/29/2020    History  reviewed. No pertinent surgical history.  Prior to Admission medications   Not on File    Allergies Patient has no known allergies.  History reviewed. No pertinent family history.  Social History Social History   Tobacco Use   Smoking status: Unknown  Substance Use Topics   Alcohol use: Yes    Comment: excessive drinking on weekends, 1 drink a day on week days   Drug use: Not Currently    Review of Systems Constitutional: No fever/chills Eyes: No visual changes. ENT: No sore throat. Cardiovascular: Denies chest pain. Respiratory: Denies shortness of breath. Gastrointestinal: No abdominal pain.  No nausea, no vomiting.  No diarrhea.  No constipation. Genitourinary: Negative for dysuria. Musculoskeletal: Acute onset right shoulder pain consistent with dislocation. Integumentary: Negative for rash. Neurological: Seizure-like activity at home observed by wife.  No current numbness nor weakness except for in his affected right arm.   ____________________________________________   PHYSICAL EXAM:  VITAL SIGNS: ED Triage Vitals  Enc Vitals Group     BP 06/29/20 0134 (!) 145/86     Pulse Rate 06/29/20 0134 82     Resp 06/29/20 0134 17     Temp 06/29/20 0134 98.9 F (37.2 C)     Temp Source 06/29/20 0134 Oral     SpO2 06/29/20 0134 97 %     Weight 06/29/20 0135 97.5 kg (215 lb)     Height 06/29/20 0135 1.803 m (5\' 11" )  Head Circumference --      Peak Flow --      Pain Score 06/29/20 0135 10     Pain Loc --      Pain Edu? --      Excl. in GC? --     Constitutional: Alert and oriented.  Initially confused but now improved. Eyes: Conjunctivae are normal.  Head: Atraumatic. Nose: No congestion/rhinnorhea. Mouth/Throat: Patient is wearing a mask. Neck: No stridor.  No meningeal signs.   Cardiovascular: Normal rate, regular rhythm. Good peripheral circulation. Respiratory: Normal respiratory effort.  No retractions. Gastrointestinal: Soft and nontender. No  distention.  Musculoskeletal: Deformity of right shoulder consistent with dislocation.  Patient is holding his arm and an abducted position, flexed at the elbow, and has pain with attempts at abduction. Neurologic:  Normal speech and language. No gross focal neurologic deficits are appreciated.  Skin:  Skin is warm, diaphoretic and intact.   ____________________________________________   LABS (all labs ordered are listed, but only abnormal results are displayed)  Labs Reviewed  MAGNESIUM - Abnormal; Notable for the following components:      Result Value   Magnesium 1.4 (*)    All other components within normal limits  CBC WITH DIFFERENTIAL/PLATELET - Abnormal; Notable for the following components:   RBC 4.13 (*)    Platelets 149 (*)    Abs Immature Granulocytes 0.09 (*)    All other components within normal limits  COMPREHENSIVE METABOLIC PANEL - Abnormal; Notable for the following components:   Potassium 3.4 (*)    CO2 17 (*)    Glucose, Bld 117 (*)    Calcium 8.5 (*)    AST 282 (*)    ALT 107 (*)    Alkaline Phosphatase 173 (*)    Total Bilirubin 3.0 (*)    Anion gap 18 (*)    All other components within normal limits  ETHANOL  LIPASE, BLOOD  URINE DRUG SCREEN, QUALITATIVE ([facility] ONLY)   ____________________________________________  EKG  ED ECG REPORT I, Loleta Roseory Rami Budhu, the attending physician, personally viewed and interpreted this ECG.  Date: 06/29/2020 EKG Time: 1:34 AM Rate: 79 Rhythm: normal sinus rhythm QRS Axis: normal Intervals: normal ST/T Wave abnormalities: normal Narrative Interpretation: no evidence of acute ischemia  ____________________________________________  RADIOLOGY I, Loleta Roseory Hodan Wurtz, personally viewed and evaluated these images (plain radiographs) as part of my medical decision making, as well as reviewing the written report by the radiologist.  ED MD interpretation: Right anterior shoulder dislocation.  CT of the head demonstrates no acute  findings.  Postreduction x-rays show appropriate reduction of the anterior shoulder dislocation with a acute on chronic Hill-Sachs deformity.  Official radiology report(s): CT Head Wo Contrast  Result Date: 06/29/2020 CLINICAL DATA:  Seizure, recent drinking band, last drink 24 hours ago EXAM: CT HEAD WITHOUT CONTRAST TECHNIQUE: Contiguous axial images were obtained from the base of the skull through the vertex without intravenous contrast. COMPARISON:  None. FINDINGS: Brain: No evidence of acute infarction, hemorrhage, hydrocephalus, extra-axial collection, visible mass lesion or mass effect. Vascular: No hyperdense vessel or unexpected calcification. Skull: No calvarial fracture or suspicious osseous lesion. No scalp swelling or hematoma. Sinuses/Orbits: Minimal thickening in the posterior ethmoid and sphenoid sinuses. Remaining paranasal sinuses and mastoid air cells are predominantly clear without layering air-fluid levels or pneumatized secretions. Included orbital structures are unremarkable. Other: None. IMPRESSION: No acute intracranial abnormality. Electronically Signed   By: Kreg ShropshirePrice  DeHay M.D.   On: 06/29/2020 03:03   DG Shoulder Right Portable  Result Date: 06/29/2020 CLINICAL DATA:  Right shoulder reduction EXAM: PORTABLE RIGHT SHOULDER COMPARISON:  Radiographs 06/29/2020, 04/03/2015 FINDINGS: Successful relocation of the right humeral head now seated within the glenoid. Hill-Sachs deformity with posterolateral impaction fracture of the humeral head is noted. Slightly increased sclerosis from more remote comparison priors may reflect an acute on chronic injury. No clear glenoid injury is radiographically apparent though partially obscured by overlying osseous structures. Telemetry leads overlie the chest. Chest wall and imaged portion of the right lung are unremarkable. IMPRESSION: Successful reduction of the right anterior shoulder dislocation. Hill-Sachs deformity with some increased sclerosis  may reflect acute on chronic injury Electronically Signed   By: Kreg Shropshire M.D.   On: 06/29/2020 04:15   DG Shoulder Right Portable  Result Date: 06/29/2020 CLINICAL DATA:  Right shoulder pain, deformity after seizure EXAM: PORTABLE RIGHT SHOULDER COMPARISON:  None. FINDINGS: Anterior right shoulder dislocation. No visible fracture. Degenerative changes in the right AC joint. IMPRESSION: Right shoulder anterior dislocation. Electronically Signed   By: Charlett Nose M.D.   On: 06/29/2020 02:05    ____________________________________________   PROCEDURES   Procedure(s) performed (including Critical Care):  .Sedation  Date/Time: 06/29/2020 3:14 AM Performed by: Loleta Rose, MD Authorized by: Loleta Rose, MD   Consent:    Consent obtained:  Written   Consent given by:  Patient and spouse   Risks discussed:  Prolonged hypoxia resulting in organ damage, inadequate sedation, respiratory compromise necessitating ventilatory assistance and intubation and vomiting   Alternatives discussed:  Analgesia without sedation Universal protocol:    Immediately prior to procedure, a time out was called: yes   Indications:    Procedure performed:  Dislocation reduction   Procedure necessitating sedation performed by:  Physician performing sedation Pre-sedation assessment:    Time since last food or drink:  6 hours   ASA classification: class 2 - patient with mild systemic disease     Mallampati score:  II - soft palate, uvula, fauces visible   Neck mobility: normal     Pre-sedation assessments completed and reviewed: airway patency, cardiovascular function, hydration status, mental status, nausea/vomiting, pain level, respiratory function and temperature     Pre-sedation assessment completed:  06/29/2020 3:14 AM Immediate pre-procedure details:    Reassessment: Patient reassessed immediately prior to procedure     Reviewed: vital signs, relevant labs/tests and NPO status     Verified: bag valve  mask available, emergency equipment available, intubation equipment available, IV patency confirmed, oxygen available and suction available   Procedure details (see MAR for exact dosages):    Preoxygenation:  Nasal cannula (2L)   Sedation:  Propofol   Intended level of sedation: deep   Analgesia:  Hydromorphone   Intra-procedure monitoring:  Continuous capnometry, continuous pulse oximetry, frequent vital sign checks, frequent LOC assessments, cardiac monitor and blood pressure monitoring   Total Provider sedation time (minutes):  14 Post-procedure details:    Post-sedation assessment completed:  06/29/2020 3:50 AM   Attendance: Constant attendance by certified staff until patient recovered     Recovery: Patient returned to pre-procedure baseline     Post-sedation assessments completed and reviewed: airway patency, cardiovascular function, hydration status, mental status, nausea/vomiting, pain level, respiratory function and temperature     Patient is stable for discharge or admission: yes     Procedure completion:  Tolerated well, no immediate complications .Ortho Injury Treatment  Date/Time: 06/29/2020 3:00 AM Performed by: Loleta Rose, MD Authorized by: Loleta Rose, MD   Consent:  Consent obtained:  Written   Consent given by:  Patient and spouse   Risks discussed:  Fracture, nerve damage, restricted joint movement, vascular damage, irreducible dislocation, recurrent dislocation and stiffnessInjury location: shoulder Location details: right shoulder Injury type: dislocation Dislocation type: anterior Chronicity: recurrent Pre-procedure neurovascular assessment: neurovascularly intact Pre-procedure distal perfusion: normal Pre-procedure neurological function: normal Pre-procedure range of motion: reduced  Anesthesia: Local anesthesia used: no  Patient sedated: Yes. Refer to sedation procedure documentation for details of sedation. Manipulation performed: yes Reduction  method: Milch technique (Initially tried Soil scientist, but was successful with Kocher) Reduction successful: yes X-ray confirmed reduction: yes Immobilization: sling (shoulder immobilizer) Post-procedure neurovascular assessment: post-procedure neurovascularly intact Post-procedure distal perfusion: normal Post-procedure neurological function: normal Post-procedure range of motion: improved   .1-3 Lead EKG Interpretation  Date/Time: 06/29/2020 4:51 AM Performed by: Loleta Rose, MD Authorized by: Loleta Rose, MD     Interpretation: normal     ECG rate:  75   ECG rate assessment: normal     Rhythm: sinus rhythm     Ectopy: none     Conduction: normal   .Critical Care  Date/Time: 06/29/2020 4:52 AM Performed by: Loleta Rose, MD Authorized by: Loleta Rose, MD   Critical care provider statement:    Critical care time (minutes):  30   Critical care time was exclusive of:  Separately billable procedures and treating other patients   Critical care was necessary to treat or prevent imminent or life-threatening deterioration of the following conditions:  Metabolic crisis (alcoholic ketoacidosis)   Critical care was time spent personally by me on the following activities:  Development of treatment plan with patient or surrogate, discussions with consultants, evaluation of patient's response to treatment, examination of patient, obtaining history from patient or surrogate, ordering and performing treatments and interventions, ordering and review of laboratory studies, ordering and review of radiographic studies, pulse oximetry, re-evaluation of patient's condition and review of old charts   ____________________________________________   INITIAL IMPRESSION / MDM / ASSESSMENT AND PLAN / ED COURSE  As part of my medical decision making, I reviewed the following data within the electronic MEDICAL RECORD NUMBER History obtained from family, Nursing notes reviewed and incorporated, Labs reviewed ,  EKG interpreted , Old chart reviewed, Radiograph reviewed , Discussed with admitting physician , and Notes from prior ED visits   Differential diagnosis includes, but is not limited to, call withdrawal seizure, other nonspecific seizure, shoulder dislocation or fracture, clavicular injury, electrolyte or metabolic abnormality, alcoholic ketoacidosis, less likely ACS.  The patient is on the cardiac monitor to evaluate for evidence of arrhythmia and/or significant heart rate changes.  Ativan 2 mg IV to prevent additional seizures and to help with probable alcohol withdrawal.  Thiamine 100 mg IV given his recent alcohol abuse.  Vital signs are generally reassuring with some mild hypertension but otherwise normal.  Patient is currently awake and alert and seems to be past the post ictal stage.  Strongly suspect right  anterior shoulder dislocation, x-rays pending.  Lab work is pending including ethanol level, standard labs, and urine drug screen.       Clinical Course as of 06/29/20 6384  Wynelle Link Jun 29, 2020  0218 Lab work is consistent with his alcohol abuse.  He has transaminitis and a total bilirubin of 2 but he has no abdominal tenderness to palpation.  His anion gap is 18 with a magnesium of 1.4 and I believe that he is an alcoholic ketoacidosis.  I am starting a D5NS  infusion at 125 mL/hr, and will give 40 meq K+ once he is awake post-sedation.  Also ordering Mag 2 g IV. [CF]  0312 Attempted to reduce shoulder using nonsedating techniques but after a milligram of Dilaudid, including the Cunningham technique and the Davos technique, but it was unsuccessful and the patient could not tolerated due to pain.  His wife is at bedside and we had a risk-benefit discussion about procedural sedation and we will proceed for procedural sedation and right shoulder reduction. [CF]  0348 Head CT unremarkable with no acute abnormalities.  Reduction appears successful, awaiting postreduction radiology confirmation.   Patient has awake and alert, protecting his airway, no intraprocedural events. [CF]  0421 I personally reviewed the patient's imaging and agree with the radiologist's interpretation that the right shoulder has been successfully reduced.  There is a Hill-Sachs deformity that likely reflects some degree of acute on chronic injury.  Patient is in a shoulder immobilizer.  He and his wife understand and agree with the plan for admission given his alcoholic ketoacidosis, alcohol withdrawal with seizure, etc. [CF]  0422 Consulted the hospitalist for admission [CF]    Clinical Course User Index [CF] Loleta Rose, MD     ____________________________________________  FINAL CLINICAL IMPRESSION(S) / ED DIAGNOSES  Final diagnoses:  Seizure (HCC)  Alcohol withdrawal syndrome with complication (HCC)  Transaminitis  Traumatic closed displaced fracture of right shoulder with anterior dislocation, initial encounter  Alcoholic ketoacidosis  Hypomagnesemia     MEDICATIONS GIVEN DURING THIS VISIT:  Medications  magnesium sulfate IVPB 2 g 50 mL (2 g Intravenous New Bag/Given 06/29/20 0418)  dextrose 5 %-0.9 % sodium chloride infusion ( Intravenous New Bag/Given 06/29/20 0417)  propofol (DIPRIVAN) 10 mg/mL bolus/IV push (  See Procedure Record 06/29/20 0330)  potassium chloride SA (KLOR-CON) CR tablet 40 mEq (has no administration in time range)  thiamine (B-1) injection 100 mg (100 mg Intravenous Given 06/29/20 0257)  LORazepam (ATIVAN) injection 2 mg (2 mg Intravenous Given 06/29/20 0152)  HYDROmorphone (DILAUDID) injection 1 mg (1 mg Intravenous Given 06/29/20 0257)  propofol (DIPRIVAN) 10 mg/mL bolus/IV push (50 mg Intravenous Given 06/29/20 2751)     ED Discharge Orders     None        Note:  This document was prepared using Dragon voice recognition software and may include unintentional dictation errors.   Loleta Rose, MD 06/29/20 [phone]

## 2020-06-29 NOTE — ED Triage Notes (Signed)
Pt presents to ER via ems after a seizure at home.  Pt has been on recent drinking binge and last had drink around 24 hrs ago.  Pt does not have hx seizures in past.  When pt had seizure, his right arm went back, and he injured his shoulder.  Pt has deformity noted to right shoulder.  Pt is A&O x4 at this time.  Pt given 100 mcg fentanyl en route w/ems.

## 2020-06-29 NOTE — Progress Notes (Signed)
Patient ID: Andrew Cummings, male   DOB: 01-Aug-1980, 40 y.o.   MRN: [id number] [facility] PROGRESS NOTE  Andrew Cummings TDS:[id number] DOB: [birth year] DOA: 06/29/2020 PCP: Farris Has, MD  HPI/Subjective: Patient stated he abruptly stopped drinking.  Has not been eating well over the last day or so.  Last night had a seizure and tongue bite.  Feels okay today.  Some soreness on his tongue.  He dislocated his right shoulder when he had a seizure.  Objective: Vitals:   06/29/20 0900 06/29/20 0930  BP: (!) 160/91 (!) 148/90  Pulse: 78 81  Resp: (!) 22 18  Temp:    SpO2: 98% 98%    Intake/Output Summary (Last 24 hours) at 06/29/2020 1126 Last data filed at 06/29/2020 0542 Gross per 24 hour  Intake 50 ml  Output --  Net 50 ml   Filed Weights   06/29/20 0135  Weight: 97.5 kg    ROS: Review of Systems  Respiratory:  Negative for shortness of breath.   Cardiovascular:  Negative for chest pain.  Gastrointestinal:  Negative for abdominal pain, nausea and vomiting.  Exam: Physical Exam HENT:     Head: Normocephalic.     Mouth/Throat:     Pharynx: No oropharyngeal exudate.  Eyes:     General: Lids are normal.     Conjunctiva/sclera: Conjunctivae normal.  Cardiovascular:     Rate and Rhythm: Normal rate and regular rhythm.     Heart sounds: Normal heart sounds, S1 normal and S2 normal.  Pulmonary:     Breath sounds: Normal breath sounds. No decreased breath sounds, wheezing, rhonchi or rales.  Abdominal:     Palpations: Abdomen is soft.     Tenderness: There is no abdominal tenderness.  Musculoskeletal:     Right lower leg: No swelling.     Left lower leg: No swelling.  Skin:    General: Skin is warm.     Findings: No rash.  Neurological:     Mental Status: He is alert and oriented to person, place, and time.     Data Reviewed: Basic Metabolic Panel: Recent Labs  Lab 06/29/20 0136 06/29/20 0452  NA 135  --   K 3.4*  --   CL 100  --   CO2 17*  --    GLUCOSE 117*  --   BUN 8  --   CREATININE 0.88  --   CALCIUM 8.5*  --   MG 1.4*  --   PHOS  --  3.9   Liver Function Tests: Recent Labs  Lab 06/29/20 0136  AST 282*  ALT 107*  ALKPHOS 173*  BILITOT 3.0*  PROT 7.6  ALBUMIN 3.6   Recent Labs  Lab 06/29/20 0136  LIPASE 50   CBC: Recent Labs  Lab 06/29/20 0136  WBC 9.7  NEUTROABS 6.1  HGB 13.8  HCT 40.3  MCV 97.6  PLT 149*     Recent Results (from the past 240 hour(s))  Resp Panel by RT-PCR (Flu A&B, Covid) Nasopharyngeal Swab     Status: None   Collection Time: 06/29/20  8:06 AM   Specimen: Nasopharyngeal Swab; Nasopharyngeal(NP) swabs in vial transport medium  Result Value Ref Range Status   SARS Coronavirus 2 by RT PCR NEGATIVE NEGATIVE Final    Comment: (NOTE) SARS-CoV-2 target nucleic acids are NOT DETECTED.  The SARS-CoV-2 RNA is generally detectable in upper respiratory specimens during the acute phase of infection. The lowest concentration of SARS-CoV-2 viral copies this assay can detect  is 138 copies/mL. A negative result does not preclude SARS-Cov-2 infection and should not be used as the sole basis for treatment or other patient management decisions. A negative result may occur with  improper specimen collection/handling, submission of specimen other than nasopharyngeal swab, presence of viral mutation(s) within the areas targeted by this assay, and inadequate number of viral copies(<138 copies/mL). A negative result must be combined with clinical observations, patient history, and epidemiological information. The expected result is Negative.  Fact Sheet for Patients:  BloggerCourse.com  Fact Sheet for Healthcare Providers:  SeriousBroker.it  This test is no t yet approved or cleared by the [place] FDA and  has been authorized for detection and/or diagnosis of SARS-CoV-2 by FDA under an Emergency Use Authorization (EUA). This EUA will  remain  in effect (meaning this test can be used) for the duration of the COVID-19 declaration under Section 564(b)(1) of the Act, 21 U.S.C.section 360bbb-3(b)(1), unless the authorization is terminated  or revoked sooner.       Influenza A by PCR NEGATIVE NEGATIVE Final   Influenza B by PCR NEGATIVE NEGATIVE Final    Comment: (NOTE) The Xpert Xpress SARS-CoV-2/FLU/RSV plus assay is intended as an aid in the diagnosis of influenza from Nasopharyngeal swab specimens and should not be used as a sole basis for treatment. Nasal washings and aspirates are unacceptable for Xpert Xpress SARS-CoV-2/FLU/RSV testing.  Fact Sheet for Patients: BloggerCourse.com  Fact Sheet for Healthcare Providers: SeriousBroker.it  This test is not yet approved or cleared by the [place] FDA and has been authorized for detection and/or diagnosis of SARS-CoV-2 by FDA under an Emergency Use Authorization (EUA). This EUA will remain in effect (meaning this test can be used) for the duration of the COVID-19 declaration under Section 564(b)(1) of the Act, 21 U.S.C. section 360bbb-3(b)(1), unless the authorization is terminated or revoked.  Performed at [facility], [address] [place], [place] [address]      Studies: CT Head Wo Contrast  Result Date: 06/29/2020 CLINICAL DATA:  Seizure, recent drinking band, last drink 24 hours ago EXAM: CT HEAD WITHOUT CONTRAST TECHNIQUE: Contiguous axial images were obtained from the base of the skull through the vertex without intravenous contrast. COMPARISON:  None. FINDINGS: Brain: No evidence of acute infarction, hemorrhage, hydrocephalus, extra-axial collection, visible mass lesion or mass effect. Vascular: No hyperdense vessel or unexpected calcification. Skull: No calvarial fracture or suspicious osseous lesion. No scalp swelling or hematoma. Sinuses/Orbits: Minimal thickening in the posterior  ethmoid and sphenoid sinuses. Remaining paranasal sinuses and mastoid air cells are predominantly clear without layering air-fluid levels or pneumatized secretions. Included orbital structures are unremarkable. Other: None. IMPRESSION: No acute intracranial abnormality. Electronically Signed   By: Kreg Shropshire M.D.   On: 06/29/2020 03:03   MR BRAIN W WO CONTRAST  Result Date: 06/29/2020 CLINICAL DATA:  Seizure, alcohol or drug related. EXAM: MRI HEAD WITHOUT AND WITH CONTRAST TECHNIQUE: Multiplanar, multiecho pulse sequences of the brain and surrounding structures were obtained without and with intravenous contrast. CONTRAST:  54mL GADAVIST GADOBUTROL 1 MMOL/ML IV SOLN COMPARISON:  Head CT from earlier today FINDINGS: Brain: No cortical finding to explain seizure. Symmetric normal appearance of the hippocampus. No acute infarct, hemorrhage, mass, hydrocephalus, or collection. No signs of Wernicke's. Vascular: Preserved flow voids and vascular enhancements. Developmental venous anomaly in the right cerebellum. Skull and upper cervical spine: Normal marrow signal Sinuses/Orbits: Retention cyst in the right sphenoid sinus. Small retention cysts at the floor of the left  maxillary sinus. IMPRESSION: Negative brain MRI.  No identified seizure focus Electronically Signed   By: Marnee Spring M.D.   On: 06/29/2020 11:19   DG Shoulder Right Portable  Result Date: 06/29/2020 CLINICAL DATA:  Right shoulder reduction EXAM: PORTABLE RIGHT SHOULDER COMPARISON:  Radiographs 06/29/2020, 04/03/2015 FINDINGS: Successful relocation of the right humeral head now seated within the glenoid. Hill-Sachs deformity with posterolateral impaction fracture of the humeral head is noted. Slightly increased sclerosis from more remote comparison priors may reflect an acute on chronic injury. No clear glenoid injury is radiographically apparent though partially obscured by overlying osseous structures. Telemetry leads overlie the chest.  Chest wall and imaged portion of the right lung are unremarkable. IMPRESSION: Successful reduction of the right anterior shoulder dislocation. Hill-Sachs deformity with some increased sclerosis may reflect acute on chronic injury Electronically Signed   By: Kreg Shropshire M.D.   On: 06/29/2020 04:15   DG Shoulder Right Portable  Result Date: 06/29/2020 CLINICAL DATA:  Right shoulder pain, deformity after seizure EXAM: PORTABLE RIGHT SHOULDER COMPARISON:  None. FINDINGS: Anterior right shoulder dislocation. No visible fracture. Degenerative changes in the right AC joint. IMPRESSION: Right shoulder anterior dislocation. Electronically Signed   By: Charlett Nose M.D.   On: 06/29/2020 02:05   US Abdomen Limited RUQ (LIVER/GB)  Result Date: 06/29/2020 CLINICAL DATA:  Abnormal liver function tests EXAM: ULTRASOUND ABDOMEN LIMITED RIGHT UPPER QUADRANT COMPARISON:  None. FINDINGS: Gallbladder: No gallstones or wall thickening visualized. No sonographic Murphy sign noted by sonographer. Common bile duct: Diameter: 3 mm.  Where visualized, no filling defect. Liver: Diffusely increased echogenicity. Negative for mass. Portal vein is patent on color Doppler imaging with normal direction of blood flow towards the liver. IMPRESSION: Hepatic steatosis. Negative gallbladder. Electronically Signed   By: Marnee Spring M.D.   On: 06/29/2020 08:34    Scheduled Meds:  enoxaparin (LOVENOX) injection  40 mg Subcutaneous Q24H   folic acid  1 mg Oral Daily   LORazepam  0-4 mg Intravenous Q6H   Followed by   Melene Muller ON 07/01/2020] LORazepam  0-4 mg Intravenous Q12H   multivitamin with minerals  1 tablet Oral Daily   pantoprazole (PROTONIX) IV  40 mg Intravenous Q24H   propofol       thiamine  100 mg Oral Daily   Or   thiamine  100 mg Intravenous Daily   Continuous Infusions:  dextrose 5 % and 0.9% NaCl 50 mL/hr at 06/29/20 1124    Assessment/Plan:  Alcohol withdrawal seizure.  Advised no driving.  Will need  neurological follow-up as outpatient.  MRI brain negative. Alcoholic ketoacidosis, elevated liver function test.  Continue thiamine and IV fluid hydration.  Liver ultrasound shows hepatic steatosis.  Watch closely for further alcohol withdrawal. Thrombocytopenia likely secondary to alcohol abuse. Hypomagnesemia replaced IV magnesium another 2 g. Hypokalemia oral supplementation Right shoulder dislocation will need follow-up as outpatient.  Right shoulder sling.      Code Status:     Code Status Orders  (From admission, onward)           Start     Ordered   06/29/20 0452  Full code  Continuous        06/29/20 0452           Code Status History     This patient has a current code status but no historical code status.      Family Communication: Wife at bedside Disposition Plan: Status is: Inpatient   Dispo: The  patient is from: Home              Anticipated d/c is to: Home              Patient currently admitted with seizure.  Monitor overnight reassess tomorrow and see if there is any further signs of alcohol withdrawal.   Difficult to place patient.  No.  Time spent: 32 minutes  [employer]  [facility]

## 2020-06-29 NOTE — H&P (Addendum)
History and Physical    Woodfin Giraud FXO:[id number] DOB: 04-30-1980 DOA: 06/29/2020  PCP: London Pepper, MD   Patient coming from: Home  I have personally briefly reviewed patient's old medical records in [place]  Chief Complaint: Seizure  HPI: Andrew Cummings is a 40 y.o. male with medical history significant for heavy alcohol use who was brought in by EMS after tonic-clonic seizure while in his bed, witnessed by his wife.  It was preceded by 24 hours of vomiting, sometimes bilious.  Patient apparently stopped drinking about 36 hours ago.  During the seizure he dislocated his right shoulder.  Patient has had frequent dislocations of that shoulder in the past which she is usually able to reduce himself but due to the seizure he was unable to.  He was mildly confused on arrival of EMS and is awake and alert at the time of my evaluation.  He complains of upper abdominal discomfort that bothered him while he was vomiting but no pain at the present time.  He denies fever or chills, cough or shortness of breath and denies diarrhea. ED Course: On arrival vitals within normal limits.  Blood work significant for hypomagnesemia of 1.4 potassium 3.4 also had AST/ALT of 282/107, alk phos 173, total bilirubin of 3, anion gap of 18 and serum bicarb of 17.  Lipase normal at 50, EtOH level less than 10 EKG as reviewed by me : Sinus at 79 with no acute ST-T wave changes Imaging: CT head with no acute intracranial findings  Patient underwent shoulder reduction in the ER and shoulder placed in an immobilizer.  He was treated with IV magnesium, potassium, IV thiamine.  Hospitalist consulted for admission.  Review of Systems: As per HPI otherwise all other systems on review of systems negative.    Past Medical History:  Diagnosis Date   History of closed shoulder dislocation    bilateral    History reviewed. No pertinent surgical history.   reports current alcohol use. He reports previous  drug use. No history on file for tobacco use.  No Known Allergies  History reviewed. No pertinent family history.    Prior to Admission medications   Not on File    Physical Exam: Vitals:   06/29/20 0351 06/29/20 0400 06/29/20 0415 06/29/20 0430  BP: (!) 145/83 (!) 149/90 (!) 150/91 (!) 149/89  Pulse: 77 80 82 78  Resp: 17 14 19 13   Temp:      TempSrc:      SpO2: 100% 98% 100% 98%  Weight:      Height:         Vitals:   06/29/20 0351 06/29/20 0400 06/29/20 0415 06/29/20 0430  BP: (!) 145/83 (!) 149/90 (!) 150/91 (!) 149/89  Pulse: 77 80 82 78  Resp: 17 14 19 13   Temp:      TempSrc:      SpO2: 100% 98% 100% 98%  Weight:      Height:          Constitutional: Alert and oriented x 3 . Not in any apparent distress HEENT:      Head: Normocephalic and atraumatic.         Eyes: PERLA, EOMI, Conjunctivae are normal. Sclera is non-icteric.       Mouth/Throat: Mucous membranes are moist.       Neck: Supple with no signs of meningismus. Cardiovascular: Regular rate and rhythm. No murmurs, gallops, or rubs. 2+ symmetrical distal pulses are present . No JVD. No  LE edema Respiratory: Respiratory effort normal .Lungs sounds clear bilaterally. No wheezes, crackles, or rhonchi.  Gastrointestinal: Soft, n tender epigastrium, and non distended with positive bowel sounds.  Genitourinary: No CVA tenderness. Musculoskeletal: Right shoulder in immobilizer. No cyanosis, or erythema of extremities. Neurologic:  Face is symmetric. Moving all extremities. No gross focal neurologic deficits . Skin: Skin is warm, dry.  No rash or ulcers Psychiatric: Mood and affect are normal    Labs on Admission: I have personally reviewed following labs and imaging studies  CBC: Recent Labs  Lab 06/29/20 0136  WBC 9.7  NEUTROABS 6.1  HGB 13.8  HCT 40.3  MCV 97.6  PLT 005*   Basic Metabolic Panel: Recent Labs  Lab 06/29/20 0136  NA 135  K 3.4*  CL 100  CO2 17*  GLUCOSE 117*  BUN 8   CREATININE 0.88  CALCIUM 8.5*  MG 1.4*   GFR: Estimated Creatinine Clearance: 135.5 mL/min (by C-G formula based on SCr of 0.88 mg/dL). Liver Function Tests: Recent Labs  Lab 06/29/20 0136  AST 282*  ALT 107*  ALKPHOS 173*  BILITOT 3.0*  PROT 7.6  ALBUMIN 3.6   Recent Labs  Lab 06/29/20 0136  LIPASE 50   No results for input(s): AMMONIA in the last 168 hours. Coagulation Profile: No results for input(s): INR, PROTIME in the last 168 hours. Cardiac Enzymes: No results for input(s): CKTOTAL, CKMB, CKMBINDEX, TROPONINI in the last 168 hours. BNP (last 3 results) No results for input(s): PROBNP in the last 8760 hours. HbA1C: No results for input(s): HGBA1C in the last 72 hours. CBG: No results for input(s): GLUCAP in the last 168 hours. Lipid Profile: No results for input(s): CHOL, HDL, LDLCALC, TRIG, CHOLHDL, LDLDIRECT in the last 72 hours. Thyroid Function Tests: No results for input(s): TSH, T4TOTAL, FREET4, T3FREE, THYROIDAB in the last 72 hours. Anemia Panel: No results for input(s): VITAMINB12, FOLATE, FERRITIN, TIBC, IRON, RETICCTPCT in the last 72 hours. Urine analysis: No results found for: COLORURINE, APPEARANCEUR, LABSPEC, PHURINE, GLUCOSEU, HGBUR, BILIRUBINUR, KETONESUR, PROTEINUR, UROBILINOGEN, NITRITE, LEUKOCYTESUR  Radiological Exams on Admission: CT Head Wo Contrast  Result Date: 06/29/2020 CLINICAL DATA:  Seizure, recent drinking band, last drink 24 hours ago EXAM: CT HEAD WITHOUT CONTRAST TECHNIQUE: Contiguous axial images were obtained from the base of the skull through the vertex without intravenous contrast. COMPARISON:  None. FINDINGS: Brain: No evidence of acute infarction, hemorrhage, hydrocephalus, extra-axial collection, visible mass lesion or mass effect. Vascular: No hyperdense vessel or unexpected calcification. Skull: No calvarial fracture or suspicious osseous lesion. No scalp swelling or hematoma. Sinuses/Orbits: Minimal thickening in the  posterior ethmoid and sphenoid sinuses. Remaining paranasal sinuses and mastoid air cells are predominantly clear without layering air-fluid levels or pneumatized secretions. Included orbital structures are unremarkable. Other: None. IMPRESSION: No acute intracranial abnormality. Electronically Signed   By: Lovena Le M.D.   On: 06/29/2020 03:03   DG Shoulder Right Portable  Result Date: 06/29/2020 CLINICAL DATA:  Right shoulder reduction EXAM: PORTABLE RIGHT SHOULDER COMPARISON:  Radiographs 06/29/2020, 04/03/2015 FINDINGS: Successful relocation of the right humeral head now seated within the glenoid. Hill-Sachs deformity with posterolateral impaction fracture of the humeral head is noted. Slightly increased sclerosis from more remote comparison priors may reflect an acute on chronic injury. No clear glenoid injury is radiographically apparent though partially obscured by overlying osseous structures. Telemetry leads overlie the chest. Chest wall and imaged portion of the right lung are unremarkable. IMPRESSION: Successful reduction of the right anterior shoulder dislocation.  Hill-Sachs deformity with some increased sclerosis may reflect acute on chronic injury Electronically Signed   By: Lovena Le M.D.   On: 06/29/2020 04:15   DG Shoulder Right Portable  Result Date: 06/29/2020 CLINICAL DATA:  Right shoulder pain, deformity after seizure EXAM: PORTABLE RIGHT SHOULDER COMPARISON:  None. FINDINGS: Anterior right shoulder dislocation. No visible fracture. Degenerative changes in the right AC joint. IMPRESSION: Right shoulder anterior dislocation. Electronically Signed   By: Rolm Baptise M.D.   On: 06/29/2020 02:05     Assessment/Plan 40 year old male with history of heavy alcohol use who was brought in by EMS admitted with a seizure after quitting drinking 1 to 2 days prior, preceded by vomiting.    Alcohol withdrawal seizure ([place])   Alcoholic ketoacidosis with gastritis -AST/ALT of 282/107, alk  phos 173, total bilirubin of 3, anion gap of 18 and serum bicarb of 17.  Lipase normal at 50, EtOH level less than 10 -Lorazepam as needed seizure - CIWA withdrawal protocol - IV hydration, IV Protonix -Clear liquid diet - Thiamine folate and multivitamin - Seizure fall and aspiration precautions - Counseling on cutting back - We will get right upper quadrant sono due to elevated LFTs    Dislocation of right shoulder joint - Joint reduction done in the ER - Pain control - Ortho referral  outpatient vs inpatient    DVT prophylaxis: Lovenox  Code Status: full code  Family Communication:  none  Disposition Plan: Back to previous home environment Consults called: none  Status:At the time of admission, it appears that the appropriate admission status for this patient is INPATIENT. This is judged to be reasonable and necessary in order to provide the required intensity of service to ensure the patient's safety given the presenting symptoms, physical exam findings, and initial radiographic and laboratory data in the context of their  Comorbid conditions.   Patient requires inpatient status due to high intensity of service, high risk for further deterioration and high frequency of surveillance required.   I certify that at the point of admission it is my clinical judgment that the patient will require inpatient hospital care spanning beyond [place] MD [facility]     06/29/2020, 4:55 AM

## 2020-06-29 NOTE — ED Notes (Signed)
MD at bedside. 

## 2020-06-30 ENCOUNTER — Inpatient Hospital Stay: Payer: BC Managed Care – PPO

## 2020-06-30 DIAGNOSIS — R7989 Other specified abnormal findings of blood chemistry: Secondary | ICD-10-CM

## 2020-06-30 DIAGNOSIS — R945 Abnormal results of liver function studies: Secondary | ICD-10-CM

## 2020-06-30 DIAGNOSIS — D696 Thrombocytopenia, unspecified: Secondary | ICD-10-CM

## 2020-06-30 DIAGNOSIS — S01512A Laceration without foreign body of oral cavity, initial encounter: Secondary | ICD-10-CM

## 2020-06-30 LAB — COMPREHENSIVE METABOLIC PANEL
ALT: 71 U/L — ABNORMAL HIGH (ref 0–44)
AST: 165 U/L — ABNORMAL HIGH (ref 15–41)
Albumin: 3.5 g/dL (ref 3.5–5.0)
Alkaline Phosphatase: 137 U/L — ABNORMAL HIGH (ref 38–126)
Anion gap: 6 (ref 5–15)
BUN: 8 mg/dL (ref 6–20)
CO2: 26 mmol/L (ref 22–32)
Calcium: 8.7 mg/dL — ABNORMAL LOW (ref 8.9–10.3)
Chloride: 101 mmol/L (ref 98–111)
Creatinine, Ser: 0.73 mg/dL (ref 0.61–1.24)
GFR, Estimated: >60 mL/min (ref >60)
Glucose, Bld: 100 mg/dL — ABNORMAL HIGH (ref 70–99)
Potassium: 3.9 mmol/L (ref 3.5–5.1)
Sodium: 133 mmol/L — ABNORMAL LOW (ref 135–145)
Total Bilirubin: 2.9 mg/dL — ABNORMAL HIGH (ref 0.3–1.2)
Total Protein: 7.2 g/dL (ref 6.5–8.1)

## 2020-06-30 LAB — MAGNESIUM: Magnesium: 1.9 mg/dL (ref 1.7–2.4)

## 2020-06-30 MED ORDER — PANTOPRAZOLE SODIUM 40 MG PO TBEC
40.0000 mg | DELAYED_RELEASE_TABLET | Freq: Every day | ORAL | Status: DC
  Administered 2020-07-01: 40 mg via ORAL
  Filled 2020-06-30: qty 1

## 2020-06-30 MED ORDER — CEFDINIR 300 MG PO CAPS
300.0000 mg | ORAL_CAPSULE | Freq: Two times a day (BID) | ORAL | Status: DC
  Administered 2020-06-30 – 2020-07-01 (×3): 300 mg via ORAL
  Filled 2020-06-30 (×4): qty 1

## 2020-06-30 MED ORDER — FLUOXETINE HCL 10 MG PO CAPS
10.0000 mg | ORAL_CAPSULE | Freq: Every day | ORAL | Status: DC
  Administered 2020-06-30 – 2020-07-01 (×2): 10 mg via ORAL
  Filled 2020-06-30 (×2): qty 1

## 2020-06-30 MED ORDER — PHYTONADIONE 5 MG PO TABS
5.0000 mg | ORAL_TABLET | Freq: Once | ORAL | Status: AC
  Administered 2020-06-30: 5 mg via ORAL
  Filled 2020-06-30: qty 1

## 2020-06-30 MED ORDER — TRAZODONE HCL 50 MG PO TABS
50.0000 mg | ORAL_TABLET | Freq: Every evening | ORAL | Status: DC | PRN
  Administered 2020-06-30: 50 mg via ORAL
  Filled 2020-06-30: qty 1

## 2020-06-30 NOTE — Plan of Care (Signed)
  Problem: Activity: Goal: Risk for activity intolerance will decrease Outcome: Progressing   Problem: Safety: Goal: Ability to remain free from injury will improve Outcome: Progressing   Problem: Nutrition: Goal: Adequate nutrition will be maintained Outcome: Progressing   

## 2020-06-30 NOTE — Progress Notes (Signed)
PHARMACIST - PHYSICIAN COMMUNICATION   CONCERNING: IV to Oral Route Change Policy  RECOMMENDATION: This patient is receiving Protonix by the intravenous route.  Based on criteria approved by the Pharmacy and Therapeutics Committee, the intravenous medication(s) is/are being converted to the equivalent oral dose form(s).   DESCRIPTION: These criteria include: The patient is eating (either orally or via tube) and/or has been taking other orally administered medications for a least 24 hours The patient has no evidence of active gastrointestinal bleeding or impaired GI absorption (gastrectomy, short bowel, patient on TNA or NPO).  If you have questions about this conversion, please contact the Pharmacy Department  l   Gardner Candle, PharmD, BCPS Clinical Pharmacist 06/30/2020 11:34 AM

## 2020-06-30 NOTE — Progress Notes (Signed)
Patient ID: Andrew Cummings, male   DOB: Jun 01, 1980, 40 y.o.   MRN: [id number] [facility] PROGRESS NOTE  Andrew Cummings UXL:[id number] DOB: 01-Mar-1980 DOA: 06/29/2020 PCP: Farris Has, MD  HPI/Subjective: Patient slept well last night.  States he still has a tiny bit of bleeding from his tongue.  Having a lot of pain in his right shoulder.  Still feels a little shaky.  Admitted with alcohol withdrawal seizure.  Objective: Vitals:   06/30/20 0831 06/30/20 1152  BP: (!) 156/95 140/87  Pulse: 86 95  Resp: 18 18  Temp: 99.1 F (37.3 C) 98.5 F (36.9 C)  SpO2: 99% 99%    Intake/Output Summary (Last 24 hours) at 06/30/2020 1244 Last data filed at 06/30/2020 1018 Gross per 24 hour  Intake 2050.59 ml  Output 1 ml  Net 2049.59 ml   Filed Weights   06/29/20 0135  Weight: 97.5 kg    ROS: Review of Systems  Respiratory:  Negative for cough and shortness of breath.   Cardiovascular:  Negative for chest pain.  Gastrointestinal:  Negative for abdominal pain, nausea and vomiting.  Musculoskeletal:  Positive for joint pain.  Exam: Physical Exam HENT:     Head: Normocephalic.     Mouth/Throat:     Comments: Tongue bite left lateral tongue.  No active bleed seen but blood around the area of laceration. Eyes:     General: Lids are normal.     Conjunctiva/sclera: Conjunctivae normal.  Cardiovascular:     Rate and Rhythm: Normal rate and regular rhythm.     Heart sounds: Normal heart sounds, S1 normal and S2 normal.  Pulmonary:     Breath sounds: No decreased breath sounds, wheezing, rhonchi or rales.  Abdominal:     Palpations: Abdomen is soft.     Tenderness: There is no abdominal tenderness.  Musculoskeletal:     Right ankle: No swelling.     Left ankle: No swelling.  Skin:    General: Skin is warm.     Findings: No rash.  Neurological:     Mental Status: He is alert and oriented to person, place, and time.     Data Reviewed: Basic Metabolic Panel: Recent  Labs  Lab 06/29/20 0136 06/29/20 0452 06/30/20 0806  NA 135  --  133*  K 3.4*  --  3.9  CL 100  --  101  CO2 17*  --  26  GLUCOSE 117*  --  100*  BUN 8  --  8  CREATININE 0.88  --  0.73  CALCIUM 8.5*  --  8.7*  MG 1.4*  --  1.9  PHOS  --  3.9  --    Liver Function Tests: Recent Labs  Lab 06/29/20 0136 06/30/20 0806  AST 282* 165*  ALT 107* 71*  ALKPHOS 173* 137*  BILITOT 3.0* 2.9*  PROT 7.6 7.2  ALBUMIN 3.6 3.5   Recent Labs  Lab 06/29/20 0136  LIPASE 50   CBC: Recent Labs  Lab 06/29/20 0136  WBC 9.7  NEUTROABS 6.1  HGB 13.8  HCT 40.3  MCV 97.6  PLT 149*     Recent Results (from the past 240 hour(s))  Resp Panel by RT-PCR (Flu A&B, Covid) Nasopharyngeal Swab     Status: None   Collection Time: 06/29/20  8:06 AM   Specimen: Nasopharyngeal Swab; Nasopharyngeal(NP) swabs in vial transport medium  Result Value Ref Range Status   SARS Coronavirus 2 by RT PCR NEGATIVE NEGATIVE Final    Comment: (  NOTE) SARS-CoV-2 target nucleic acids are NOT DETECTED.  The SARS-CoV-2 RNA is generally detectable in upper respiratory specimens during the acute phase of infection. The lowest concentration of SARS-CoV-2 viral copies this assay can detect is 138 copies/mL. A negative result does not preclude SARS-Cov-2 infection and should not be used as the sole basis for treatment or other patient management decisions. A negative result may occur with  improper specimen collection/handling, submission of specimen other than nasopharyngeal swab, presence of viral mutation(s) within the areas targeted by this assay, and inadequate number of viral copies(<138 copies/mL). A negative result must be combined with clinical observations, patient history, and epidemiological information. The expected result is Negative.  Fact Sheet for Patients:  BloggerCourse.com  Fact Sheet for Healthcare Providers:  SeriousBroker.it  This test  is no t yet approved or cleared by the [place] FDA and  has been authorized for detection and/or diagnosis of SARS-CoV-2 by FDA under an Emergency Use Authorization (EUA). This EUA will remain  in effect (meaning this test can be used) for the duration of the COVID-19 declaration under Section 564(b)(1) of the Act, 21 U.S.C.section 360bbb-3(b)(1), unless the authorization is terminated  or revoked sooner.       Influenza A by PCR NEGATIVE NEGATIVE Final   Influenza B by PCR NEGATIVE NEGATIVE Final    Comment: (NOTE) The Xpert Xpress SARS-CoV-2/FLU/RSV plus assay is intended as an aid in the diagnosis of influenza from Nasopharyngeal swab specimens and should not be used as a sole basis for treatment. Nasal washings and aspirates are unacceptable for Xpert Xpress SARS-CoV-2/FLU/RSV testing.  Fact Sheet for Patients: BloggerCourse.com  Fact Sheet for Healthcare Providers: SeriousBroker.it  This test is not yet approved or cleared by the [place] FDA and has been authorized for detection and/or diagnosis of SARS-CoV-2 by FDA under an Emergency Use Authorization (EUA). This EUA will remain in effect (meaning this test can be used) for the duration of the COVID-19 declaration under Section 564(b)(1) of the Act, 21 U.S.C. section 360bbb-3(b)(1), unless the authorization is terminated or revoked.  Performed at [facility], [address] [place], [place] [address]      Studies: CT Head Wo Contrast  Result Date: 06/29/2020 CLINICAL DATA:  Seizure, recent drinking band, last drink 24 hours ago EXAM: CT HEAD WITHOUT CONTRAST TECHNIQUE: Contiguous axial images were obtained from the base of the skull through the vertex without intravenous contrast. COMPARISON:  None. FINDINGS: Brain: No evidence of acute infarction, hemorrhage, hydrocephalus, extra-axial collection, visible mass lesion or mass effect. Vascular: No  hyperdense vessel or unexpected calcification. Skull: No calvarial fracture or suspicious osseous lesion. No scalp swelling or hematoma. Sinuses/Orbits: Minimal thickening in the posterior ethmoid and sphenoid sinuses. Remaining paranasal sinuses and mastoid air cells are predominantly clear without layering air-fluid levels or pneumatized secretions. Included orbital structures are unremarkable. Other: None. IMPRESSION: No acute intracranial abnormality. Electronically Signed   By: Kreg Shropshire M.D.   On: 06/29/2020 03:03   MR BRAIN W WO CONTRAST  Result Date: 06/29/2020 CLINICAL DATA:  Seizure, alcohol or drug related. EXAM: MRI HEAD WITHOUT AND WITH CONTRAST TECHNIQUE: Multiplanar, multiecho pulse sequences of the brain and surrounding structures were obtained without and with intravenous contrast. CONTRAST:  48mL GADAVIST GADOBUTROL 1 MMOL/ML IV SOLN COMPARISON:  Head CT from earlier today FINDINGS: Brain: No cortical finding to explain seizure. Symmetric normal appearance of the hippocampus. No acute infarct, hemorrhage, mass, hydrocephalus, or collection. No signs of Wernicke's. Vascular: Preserved flow  voids and vascular enhancements. Developmental venous anomaly in the right cerebellum. Skull and upper cervical spine: Normal marrow signal Sinuses/Orbits: Retention cyst in the right sphenoid sinus. Small retention cysts at the floor of the left maxillary sinus. IMPRESSION: Negative brain MRI.  No identified seizure focus Electronically Signed   By: Marnee SpringJonathon  Watts M.D.   On: 06/29/2020 11:19   DG Shoulder Right Portable  Result Date: 06/29/2020 CLINICAL DATA:  Right shoulder reduction EXAM: PORTABLE RIGHT SHOULDER COMPARISON:  Radiographs 06/29/2020, 04/03/2015 FINDINGS: Successful relocation of the right humeral head now seated within the glenoid. Hill-Sachs deformity with posterolateral impaction fracture of the humeral head is noted. Slightly increased sclerosis from more remote comparison priors  may reflect an acute on chronic injury. No clear glenoid injury is radiographically apparent though partially obscured by overlying osseous structures. Telemetry leads overlie the chest. Chest wall and imaged portion of the right lung are unremarkable. IMPRESSION: Successful reduction of the right anterior shoulder dislocation. Hill-Sachs deformity with some increased sclerosis may reflect acute on chronic injury Electronically Signed   By: Kreg ShropshirePrice  DeHay M.D.   On: 06/29/2020 04:15   DG Shoulder Right Portable  Result Date: 06/29/2020 CLINICAL DATA:  Right shoulder pain, deformity after seizure EXAM: PORTABLE RIGHT SHOULDER COMPARISON:  None. FINDINGS: Anterior right shoulder dislocation. No visible fracture. Degenerative changes in the right AC joint. IMPRESSION: Right shoulder anterior dislocation. Electronically Signed   By: Charlett NoseKevin  Dover M.D.   On: 06/29/2020 02:05   US Abdomen Limited RUQ (LIVER/GB)  Result Date: 06/29/2020 CLINICAL DATA:  Abnormal liver function tests EXAM: ULTRASOUND ABDOMEN LIMITED RIGHT UPPER QUADRANT COMPARISON:  None. FINDINGS: Gallbladder: No gallstones or wall thickening visualized. No sonographic Murphy sign noted by sonographer. Common bile duct: Diameter: 3 mm.  Where visualized, no filling defect. Liver: Diffusely increased echogenicity. Negative for mass. Portal vein is patent on color Doppler imaging with normal direction of blood flow towards the liver. IMPRESSION: Hepatic steatosis. Negative gallbladder. Electronically Signed   By: Marnee SpringJonathon  Watts M.D.   On: 06/29/2020 08:34    Scheduled Meds:  cefdinir  300 mg Oral Q12H   enoxaparin (LOVENOX) injection  40 mg Subcutaneous Q24H   FLUoxetine  10 mg Oral Daily   folic acid  1 mg Oral Daily   LORazepam  0-4 mg Intravenous Q6H   Followed by   Melene Muller[START ON 07/01/2020] LORazepam  0-4 mg Intravenous Q12H   multivitamin with minerals  1 tablet Oral Daily   [START ON 07/01/2020] pantoprazole  40 mg Oral Daily   thiamine  100  mg Oral Daily   Or   thiamine  100 mg Intravenous Daily     Assessment/Plan:  Alcohol withdrawal seizure.  No driving for 6 months.  MRI of the brain negative. Alcoholic ketoacidosis and elevated liver function test.  Continue thiamine.  Liver ultrasound shows hepatic steatosis.  LFTs trending better today but still elevated.  AST down to 165 and ALT down to 71.  Slight tremor.  Continue to monitor today. Thrombocytopenia likely secondary alcohol abuse.  Check a CBC tomorrow morning Tongue laceration from seizure with slight decline.  We will give vitamin K today and check a PT/INR PTT tomorrow morning.  Empiric antibiotic. Hypomagnesemia.  Replaced into the normal range.  Hypokalemia replace. Right shoulder dislocation reduced in ER.  Patient with further pain today.  Repeat shoulder x-ray shows an acute Hill-Sachs defect.  We will ask orthopedics to evaluate and most likely will need outpatient follow up.  Code Status:     Code Status Orders  (From admission, onward)           Start     Ordered   06/29/20 0452  Full code  Continuous        06/29/20 0452           Code Status History     This patient has a current code status but no historical code status.      Family Communication: Mother at bedside Disposition Plan: Status is: Inpatient  Dispo: The patient is from: Home              Anticipated d/c is to: Home, potentially tomorrow              Patient currently being watched closely for alcohol withdrawal seizure.  Still has some tremor today.  More pain in his right shoulder today.   Difficult to place patient.  No.  Consultants: Will have orthopedics evaluate  Time spent: 29 minutes  Pollyanna [employer]

## 2020-06-30 NOTE — Consult Note (Signed)
ORTHOPAEDIC CONSULTATION  REQUESTING PHYSICIAN: Alford Highland, MD  Chief Complaint:   Right shoulder pain status post anterior dislocation  HPI: Andrew Cummings is a 40 y.o. male who was admitted for an alcohol withdrawal seizure, during which he dislocated his right shoulder.  This was closed reduced by the ER.  Patient was noted to have increasing pain in the right shoulder today and orthopedics is consulted for evaluation of his right shoulder.  The patient is seen in his hospital room sitting in the chair.  His wife is in the room.  The patient states that he has a long history of bilateral shoulder instability and dislocations.  It sounds as if he has been able to relocate his shoulder himself in the past.  He states the first time he dislocated his shoulder was in college during upper extremity weightbearing exercises.  Patient states he has had numerous bilateral dislocations since then.  Patient had previously seen Dr. Madelon Lips at [facility] orthopedics several years ago and it was recommended he undergo shoulder surgery.  The patient states he is apprehensive about surgery and kept putting it off.  Patient denies a history of seizures.  He states he is recently been drinking more heavily and tried to stop cold [place] and had a withdrawal seizure.   Patient is seen without his sling on.  He states that even with the sling on he has experienced subluxation.  He states a nurse came into the room earlier and startled him which caused the subluxation to the right shoulder with increased pain.  Past Medical History:  Diagnosis Date   History of closed shoulder dislocation    bilateral   History reviewed. No pertinent surgical history. Social History   Socioeconomic History   Marital status: Married    Spouse name: Not on file   Number of children: Not on file   Years of education: Not on file   Highest education level: Not on file  Occupational History   Not on file  Tobacco  Use   Smoking status: Unknown   Smokeless tobacco: Not on file  Substance and Sexual Activity   Alcohol use: Yes    Comment: excessive drinking on weekends, 1 drink a day on week days   Drug use: Not Currently   Sexual activity: Not on file  Other Topics Concern   Not on file  Social History Narrative   Not on file   Social Determinants of Health   Financial Resource Strain: Not on file  Food Insecurity: Not on file  Transportation Needs: Not on file  Physical Activity: Not on file  Stress: Not on file  Social Connections: Not on file   History reviewed. No pertinent family history. No Known Allergies Prior to Admission medications   Medication Sig Start Date End Date Taking? Authorizing Provider  ALPRAZolam Prudy Feeler) 0.5 MG tablet Take 0.5 mg by mouth 2 (two) times daily as needed for anxiety.   Yes [provider]  FLUoxetine (PROZAC) 10 MG capsule Take 10 mg by mouth daily.   Yes [provider]  omeprazole (PRILOSEC OTC) 20 MG tablet Take 20 mg by mouth daily as needed (acid reflux symptoms).   Yes [provider]  traZODone (DESYREL) 50 MG tablet Take 50 mg by mouth at bedtime.   Yes [provider]   DG Shoulder Right  Result Date: 06/30/2020 CLINICAL DATA:  Pain and limited range of motion EXAM: RIGHT SHOULDER - 2+ VIEW COMPARISON:  June 29, 2020  FINDINGS: Frontal and Y scapular images obtained. There is a Hill-Sachs defect which appears acute. No other fracture. No dislocation. There is narrowing of the acromioclavicular joint. Glenohumeral joint appears unremarkable. Right lung clear. IMPRESSION: Acute appearing Hill-Sachs defect on the right. No other fracture. No dislocation. Narrowing acromioclavicular joint. Electronically Signed   By: Bretta Bang III M.D.   On: 06/30/2020 12:42   CT Head Wo Contrast  Result Date: 06/29/2020 CLINICAL DATA:  Seizure, recent drinking band, last drink 24 hours ago EXAM: CT HEAD WITHOUT CONTRAST  TECHNIQUE: Contiguous axial images were obtained from the base of the skull through the vertex without intravenous contrast. COMPARISON:  None. FINDINGS: Brain: No evidence of acute infarction, hemorrhage, hydrocephalus, extra-axial collection, visible mass lesion or mass effect. Vascular: No hyperdense vessel or unexpected calcification. Skull: No calvarial fracture or suspicious osseous lesion. No scalp swelling or hematoma. Sinuses/Orbits: Minimal thickening in the posterior ethmoid and sphenoid sinuses. Remaining paranasal sinuses and mastoid air cells are predominantly clear without layering air-fluid levels or pneumatized secretions. Included orbital structures are unremarkable. Other: None. IMPRESSION: No acute intracranial abnormality. Electronically Signed   By: Kreg Shropshire M.D.   On: 06/29/2020 03:03   MR BRAIN W WO CONTRAST  Result Date: 06/29/2020 CLINICAL DATA:  Seizure, alcohol or drug related. EXAM: MRI HEAD WITHOUT AND WITH CONTRAST TECHNIQUE: Multiplanar, multiecho pulse sequences of the brain and surrounding structures were obtained without and with intravenous contrast. CONTRAST:  53mL GADAVIST GADOBUTROL 1 MMOL/ML IV SOLN COMPARISON:  Head CT from earlier today FINDINGS: Brain: No cortical finding to explain seizure. Symmetric normal appearance of the hippocampus. No acute infarct, hemorrhage, mass, hydrocephalus, or collection. No signs of Wernicke's. Vascular: Preserved flow voids and vascular enhancements. Developmental venous anomaly in the right cerebellum. Skull and upper cervical spine: Normal marrow signal Sinuses/Orbits: Retention cyst in the right sphenoid sinus. Small retention cysts at the floor of the left maxillary sinus. IMPRESSION: Negative brain MRI.  No identified seizure focus Electronically Signed   By: Marnee Spring M.D.   On: 06/29/2020 11:19   DG Shoulder Right Portable  Result Date: 06/29/2020 CLINICAL DATA:  Right shoulder reduction EXAM: PORTABLE RIGHT  SHOULDER COMPARISON:  Radiographs 06/29/2020, 04/03/2015 FINDINGS: Successful relocation of the right humeral head now seated within the glenoid. Hill-Sachs deformity with posterolateral impaction fracture of the humeral head is noted. Slightly increased sclerosis from more remote comparison priors may reflect an acute on chronic injury. No clear glenoid injury is radiographically apparent though partially obscured by overlying osseous structures. Telemetry leads overlie the chest. Chest wall and imaged portion of the right lung are unremarkable. IMPRESSION: Successful reduction of the right anterior shoulder dislocation. Hill-Sachs deformity with some increased sclerosis may reflect acute on chronic injury Electronically Signed   By: Kreg Shropshire M.D.   On: 06/29/2020 04:15   DG Shoulder Right Portable  Result Date: 06/29/2020 CLINICAL DATA:  Right shoulder pain, deformity after seizure EXAM: PORTABLE RIGHT SHOULDER COMPARISON:  None. FINDINGS: Anterior right shoulder dislocation. No visible fracture. Degenerative changes in the right AC joint. IMPRESSION: Right shoulder anterior dislocation. Electronically Signed   By: Charlett Nose M.D.   On: 06/29/2020 02:05   US Abdomen Limited RUQ (LIVER/GB)  Result Date: 06/29/2020 CLINICAL DATA:  Abnormal liver function tests EXAM: ULTRASOUND ABDOMEN LIMITED RIGHT UPPER QUADRANT COMPARISON:  None. FINDINGS: Gallbladder: No gallstones or wall thickening visualized. No sonographic Murphy sign noted by sonographer. Common bile duct: Diameter: 3 mm.  Where visualized, no filling defect.  Liver: Diffusely increased echogenicity. Negative for mass. Portal vein is patent on color Doppler imaging with normal direction of blood flow towards the liver. IMPRESSION: Hepatic steatosis. Negative gallbladder. Electronically Signed   By: Marnee Spring M.D.   On: 06/29/2020 08:34    Positive ROS: All other systems have been reviewed and were otherwise negative with the exception of  those mentioned in the HPI and as above.  Physical Exam: General: Alert, no acute distress  MUSCULOSKELETAL: Right shoulder: Patient's arm is at his side.  He demonstrates full digital and wrist range of motion.  Patient is unable to actively forward elevate or abduct beyond approximately 20 to 30 degrees.  He demonstrates apprehension with shoulder motion.  There is no obvious deformity to the right shoulder.  Assessment: Right shoulder pain in the setting of recent anterior shoulder dislocation in the setting of chronic bilateral shoulder instability  Plan: I explained to the patient and his wife that nothing surgical will be performed while he is an inpatient.  I am recommending she follow-up in our office for outpatient work-up including MR arthrogram evaluation of both shoulders.  I reviewed the patient's pre and postreduction x-rays.  His glenohumeral joint is located.  Patient has a large Hill-Sachs lesion on the right shoulder.  The AP and scapular Y views do not allow for evaluation of a glenoid fracture.  I recommend the patient continue to wear his sling and swath at all times to avoid potential redislocation.  He should avoid active forward elevation and abduction and leave his arm by his side until follow-up in our office within a week.  MR arthrogram evaluation of the bilateral shoulders will allow for assessment of the Hill-Sachs lesion and whether or not it is engaging as well as evaluating for a labral tear (Bankart lesion) and glenohumeral ligament sprains which will help to determine the type of surgery which may best stabilize his shoulders.    Patient and his wife understood and agreed with this plan.  They will follow-up in our office in approximately a week after discharge.   Juanell Fairly, MD    06/30/2020 6:42 PM

## 2020-06-30 NOTE — Progress Notes (Signed)
Initial Nutrition Assessment  DOCUMENTATION CODES:   Not applicable  INTERVENTION:   -Ensure Enlive po BID, each supplement provides 350 kcal and 20 grams of protein  -MVI with minerals daily  NUTRITION DIAGNOSIS:   Predicted suboptimal nutrient intake related to chronic illness (ETOH abuse) as evidenced by estimated needs.  GOAL:   Patient will meet greater than or equal to 90% of their needs  MONITOR:   PO intake, Supplement acceptance, Labs, Weight trends, Skin, I & O's  REASON FOR ASSESSMENT:   Malnutrition Screening Tool    ASSESSMENT:   40 year old male with history of heavy alcohol use who was brought in by EMS admitted with a seizure after quitting drinking 1 to 2 days prior, preceded by vomiting.  Pt admitted with alcohol withdrawal seizure and alcoholic ketoacidosis with gastritis.   6/12- joint reduction in ER completed secondary to rt shoulder dislocation  Reviewed I/O's: +1.8 L x 24 hours   Pt unavailable at time of visit. Attempted to speak with pt via call to hospital room phone, however, unable to reach. RD unable to obtain further nutrition-related history or complete nutrition-focused physical exam at this time.    Per MD notes, pt reports good appetite. He tongue feels better (bit it during seizure).   Reviewed wt hx; wt has been stable over the past few years.   Pt with increased nutritional needs and would benefit from addition of oral nutrition supplements.   Medications reviewed and include folic acid, ativan, thiamine, and dextrose 5%-0.9% sodium chloride infusion @ 50 ml/hr.   Labs reviewed: Na: 133.   Diet Order:   Diet Order             Diet regular Room service appropriate? Yes; Fluid consistency: Thin  Diet effective now                   EDUCATION NEEDS:   No education needs have been identified at this time  Skin:  Skin Assessment: Reviewed RN Assessment  Last BM:  06/28/20  Height:   Ht Readings from Last 1  Encounters:  06/29/20 5\' 11"  (1.803 m)    Weight:   Wt Readings from Last 1 Encounters:  06/29/20 97.5 kg    Ideal Body Weight:  78.2 kg  BMI:  Body mass index is 29.99 kg/m.  Estimated Nutritional Needs:   Kcal:  2150-2350  Protein:  105-120 grams  Fluid:  > 2 L    12-26-1989, RD, LDN, CDCES Registered Dietitian II Certified Diabetes Care and Education Specialist Please refer to [facility] for RD and/or RD on-call/weekend/after hours pager

## 2020-07-01 ENCOUNTER — Inpatient Hospital Stay: Payer: BC Managed Care – PPO

## 2020-07-01 DIAGNOSIS — S01512S Laceration without foreign body of oral cavity, sequela: Secondary | ICD-10-CM

## 2020-07-01 DIAGNOSIS — D696 Thrombocytopenia, unspecified: Secondary | ICD-10-CM

## 2020-07-01 LAB — COMPREHENSIVE METABOLIC PANEL
ALT: 70 U/L — ABNORMAL HIGH (ref 0–44)
AST: 161 U/L — ABNORMAL HIGH (ref 15–41)
Albumin: 3.5 g/dL (ref 3.5–5.0)
Alkaline Phosphatase: 151 U/L — ABNORMAL HIGH (ref 38–126)
Anion gap: 7 (ref 5–15)
BUN: 7 mg/dL (ref 6–20)
CO2: 27 mmol/L (ref 22–32)
Calcium: 9.2 mg/dL (ref 8.9–10.3)
Chloride: 102 mmol/L (ref 98–111)
Creatinine, Ser: 0.65 mg/dL (ref 0.61–1.24)
GFR, Estimated: >60 mL/min (ref >60)
Glucose, Bld: 93 mg/dL (ref 70–99)
Potassium: 4 mmol/L (ref 3.5–5.1)
Sodium: 136 mmol/L (ref 135–145)
Total Bilirubin: 3.6 mg/dL — ABNORMAL HIGH (ref 0.3–1.2)
Total Protein: 7.2 g/dL (ref 6.5–8.1)

## 2020-07-01 LAB — CBC
HCT: 35.3 % — ABNORMAL LOW (ref 39.0–52.0)
Hemoglobin: 12.5 g/dL — ABNORMAL LOW (ref 13.0–17.0)
MCH: 33.6 pg (ref 26.0–34.0)
MCHC: 35.4 g/dL (ref 30.0–36.0)
MCV: 94.9 fL (ref 80.0–100.0)
Platelets: 94 10*3/uL — ABNORMAL LOW (ref 150–400)
RBC: 3.72 MIL/uL — ABNORMAL LOW (ref 4.22–5.81)
RDW: 14 % (ref 11.5–15.5)
WBC: 6.5 10*3/uL (ref 4.0–10.5)
nRBC: 0 % (ref 0.0–0.2)

## 2020-07-01 LAB — PROTIME-INR
INR: 1.4 — ABNORMAL HIGH (ref 0.8–1.2)
Prothrombin Time: 17.3 seconds — ABNORMAL HIGH (ref 11.4–15.2)

## 2020-07-01 LAB — PHOSPHORUS: Phosphorus: 3.6 mg/dL (ref 2.5–4.6)

## 2020-07-01 LAB — MAGNESIUM: Magnesium: 2 mg/dL (ref 1.7–2.4)

## 2020-07-01 LAB — APTT: aPTT: 38 seconds — ABNORMAL HIGH (ref 24–36)

## 2020-07-01 MED ORDER — GADOBUTROL 1 MMOL/ML IV SOLN
10.0000 mL | Freq: Once | INTRAVENOUS | Status: AC | PRN
  Administered 2020-07-01: 10 mL via INTRAVENOUS

## 2020-07-01 MED ORDER — THIAMINE HCL 100 MG PO TABS: 100.0000 mg | ORAL_TABLET | Freq: Every day | ORAL | 0 refills | Status: DC

## 2020-07-01 MED ORDER — PHYTONADIONE 5 MG PO TABS
5.0000 mg | ORAL_TABLET | Freq: Once | ORAL | Status: AC
  Administered 2020-07-01: 5 mg via ORAL
  Filled 2020-07-01: qty 1

## 2020-07-01 MED ORDER — ADULT MULTIVITAMIN W/MINERALS CH: 1.0000 | ORAL_TABLET | Freq: Every day | ORAL | 0 refills | Status: DC

## 2020-07-01 MED ORDER — LORAZEPAM 0.5 MG PO TABS: ORAL_TABLET | ORAL | 0 refills | Status: DC

## 2020-07-01 MED ORDER — FOLIC ACID 1 MG PO TABS: 1.0000 mg | ORAL_TABLET | Freq: Every day | ORAL | 0 refills | Status: DC

## 2020-07-01 MED ORDER — TRAZODONE HCL 50 MG PO TABS: 50.0000 mg | ORAL_TABLET | Freq: Every day | ORAL | 0 refills | Status: DC

## 2020-07-01 MED ORDER — CEFDINIR 300 MG PO CAPS: 300.0000 mg | ORAL_CAPSULE | Freq: Two times a day (BID) | ORAL | 0 refills | Status: AC

## 2020-07-01 NOTE — Plan of Care (Signed)
  Problem: Activity: Goal: Risk for activity intolerance will decrease 07/01/2020 1426 by Jules Schick, RN Outcome: Completed/Met 07/01/2020 0914 by Jules Schick, RN Outcome: Progressing   Problem: Nutrition: Goal: Adequate nutrition will be maintained 07/01/2020 1426 by Jules Schick, RN Outcome: Completed/Met 07/01/2020 0914 by Jules Schick, RN Outcome: Progressing   Problem: Safety: Goal: Ability to remain free from injury will improve 07/01/2020 1426 by Jules Schick, RN Outcome: Completed/Met 07/01/2020 0914 by Jules Schick, RN Outcome: Progressing

## 2020-07-01 NOTE — Progress Notes (Signed)
Nurse went to check on patient.  Patient complained of right shoulder pain.  Nurse sent message to Dr. Martha Clan.  Dr. Edwina Barth ordered an xray.  The xray was negative for dislocation.  Patient was sleeping when results were in and sleeping aid had been administered by the nurse.

## 2020-07-01 NOTE — Discharge Instructions (Signed)
No Driving for 6 months. 

## 2020-07-01 NOTE — Plan of Care (Signed)
  Problem: Activity: Goal: Risk for activity intolerance will decrease Outcome: Progressing   Problem: Nutrition: Goal: Adequate nutrition will be maintained Outcome: Progressing   Problem: Safety: Goal: Ability to remain free from injury will improve Outcome: Progressing   

## 2020-07-01 NOTE — Discharge Summary (Signed)
[facility] [place] NAME: Andrew Cummings    MR#:  [id number]  DATE OF BIRTH:  1980/05/16  DATE OF ADMISSION:  06/29/2020 ADMITTING PHYSICIAN: Athena Masse, MD  DATE OF DISCHARGE: 07/01/2020  5:21 PM  PRIMARY CARE PHYSICIAN: London Pepper, MD    ADMISSION DIAGNOSIS:  Alcoholic ketoacidosis [A30.9] Hypomagnesemia [E83.42] Seizure ([place]) [R56.9] Transaminitis [R74.01] Alcohol withdrawal seizure ([place]) [M07.680, R56.9] Abnormal LFTs (liver function tests) [R94.5] Alcohol withdrawal syndrome with complication ([place]) [S81.103] Traumatic closed displaced fracture of right shoulder with anterior dislocation, initial encounter [S42.91XA]  DISCHARGE DIAGNOSIS:  Active Problems:   Alcohol withdrawal seizure ([place])   Alcoholic ketoacidosis   Dislocation of right shoulder joint   Abnormal LFTs (liver function tests)   Laceration of tongue   Thrombocytopenia ([place])   SECONDARY DIAGNOSIS:   Past Medical History:  Diagnosis Date  . History of closed shoulder dislocation    bilateral    HOSPITAL COURSE:   Alcohol withdrawal seizure.  No driving for 6 months.  Will need a note from PMD in order to go back to driving.  Will need to be seizure-free for 6 months in order to drive again.  MRI of the brain negative for acute events.  No need for seizure medication since this was his first seizure. Alcoholic ketoacidosis and elevated liver function test.  Continue thiamine, folic acid.  Liver ultrasound showing hepatic steatosis.  MRCP showing diffuse fatty infiltration of the liver.  Total bilirubin 3.6 upon discharge.  Alk phos 151.  AST came down from 282 down to 161.  ALT came down from 107 down to 70.  Madrey's discriminant function 14.  Advised alcohol cessation.  Recommend checking a CMP as outpatient. Thrombocytopenia secondary to alcohol abuse.  Last platelet count 91.  Recommend checking a CBC as outpatient. Tongue laceration from seizure  with some bleeding.  I gave vitamin K yesterday and again today.  Held Lovenox.  No further bleeding upon disposition.  Empiric Omnicef. Hypomagnesemia.  Magnesium replaced into the normal range. Hypokalemia replaced in the normal range. Right shoulder dislocation in ER.  Reduced by ER physician.  Patient having pain in the right shoulder and repeat x-ray showed acute Hill-Sachs defect.  Appreciate orthopedic evaluation Will need outpatient follow-up.  Continue to wear arm sling.  Orthopedics recommends MR arthrogram of bilateral shoulders as outpatient.  DISCHARGE CONDITIONS:   Satisfactory  CONSULTS OBTAINED:   Orthopedic surgery  DRUG ALLERGIES:  No Known Allergies  DISCHARGE MEDICATIONS:   Allergies as of 07/01/2020   No Known Allergies      Medication List     STOP taking these medications    ALPRAZolam 0.5 MG tablet Commonly known as: XANAX       TAKE these medications    cefdinir 300 MG capsule Commonly known as: OMNICEF Take 1 capsule (300 mg total) by mouth every 12 (twelve) hours for 4 days.   FLUoxetine 10 MG capsule Commonly known as: PROZAC Take 10 mg by mouth daily.   folic acid 1 MG tablet Commonly known as: FOLVITE Take 1 tablet (1 mg total) by mouth daily. Start taking on: July 02, 2020   LORazepam 0.5 MG tablet Commonly known as: Ativan One tab po three times a day as needed for shakiness day 1; on tab po twice a day as needed day2,3; one tab po daily as needed for shakiness day4,5   multivitamin with minerals Tabs tablet Take 1 tablet by mouth daily. Start  taking on: July 02, 2020   omeprazole 20 MG tablet Commonly known as: PRILOSEC OTC Take 20 mg by mouth daily as needed (acid reflux symptoms).   thiamine 100 MG tablet Take 1 tablet (100 mg total) by mouth daily. Start taking on: July 02, 2020   traZODone 50 MG tablet Commonly known as: DESYREL Take 1 tablet (50 mg total) by mouth at bedtime.         DISCHARGE INSTRUCTIONS:    Follow-up PMD 5 days Follow-up orthopedic surgery 1 week  If you experience worsening of your admission symptoms, develop shortness of breath, life threatening emergency, suicidal or homicidal thoughts you must seek medical attention immediately by calling 911 or calling your MD immediately  if symptoms less severe.  You Must read complete instructions/literature along with all the possible adverse reactions/side effects for all the Medicines you take and that have been prescribed to you. Take any new Medicines after you have completely understood and accept all the possible adverse reactions/side effects.   Please note  You were cared for by a hospitalist during your hospital stay. If you have any questions about your discharge medications or the care you received while you were in the hospital after you are discharged, you can call the unit and asked to speak with the hospitalist on call if the hospitalist that took care of you is not available. Once you are discharged, your primary care physician will handle any further medical issues. Please note that NO REFILLS for any discharge medications will be authorized once you are discharged, as it is imperative that you return to your primary care physician (or establish a relationship with a primary care physician if you do not have one) for your aftercare needs so that they can reassess your need for medications and monitor your lab values.    Today   CHIEF COMPLAINT:   Chief Complaint  Patient presents with  . Seizures  . Shoulder Injury    HISTORY OF PRESENT ILLNESS:  Andrew Cummings  is a 40 y.o. male with a known history of came in with alcohol withdrawal seizure and right shoulder dislocation.   VITAL SIGNS:  Blood pressure 133/90, pulse (!) 104, temperature 99.2 F (37.3 C), resp. rate 16, height _0  (1.803 m), weight 97.5 kg, SpO2 97 %.  I/O:  No intake or output data in the 24 hours ending 07/01/20 1735  PHYSICAL  EXAMINATION:  GENERAL:  40 y.o.-year-old patient lying in the bed with no acute distress.  EYES: Pupils equal, round, reactive to light and accommodation. No scleral icterus. HEENT: Head atraumatic, normocephalic. Oropharynx and nasopharynx clear.   LUNGS: Normal breath sounds bilaterally, no wheezing, rales,rhonchi or crepitation. No use of accessory muscles of respiration.  CARDIOVASCULAR: S1, S2 normal. No murmurs, rubs, or gallops.  ABDOMEN: Soft, non-tender. EXTREMITIES: No pedal edema.  NEUROLOGIC: Cranial nerves II through XII are intact. Sensation intact. Gait not checked.  PSYCHIATRIC: The patient is alert and oriented x 3.  SKIN: No obvious rash, lesion, or ulcer.   DATA REVIEW:   CBC Recent Labs  Lab 07/01/20 0332  WBC 6.5  HGB 12.5*  HCT 35.3*  PLT 94*    Chemistries  Recent Labs  Lab 07/01/20 0332  NA 136  K 4.0  CL 102  CO2 27  GLUCOSE 93  BUN 7  CREATININE 0.65  CALCIUM 9.2  MG 2.0  AST 161*  ALT 70*  ALKPHOS 151*  BILITOT 3.6*     Microbiology  Results  Results for orders placed or performed during the hospital encounter of 06/29/20  Resp Panel by RT-PCR (Flu A&B, Covid) Nasopharyngeal Swab     Status: None   Collection Time: 06/29/20  8:06 AM   Specimen: Nasopharyngeal Swab; Nasopharyngeal(NP) swabs in vial transport medium  Result Value Ref Range Status   SARS Coronavirus 2 by RT PCR NEGATIVE NEGATIVE Final    Comment: (NOTE) SARS-CoV-2 target nucleic acids are NOT DETECTED.  The SARS-CoV-2 RNA is generally detectable in upper respiratory specimens during the acute phase of infection. The lowest concentration of SARS-CoV-2 viral copies this assay can detect is 138 copies/mL. A negative result does not preclude SARS-Cov-2 infection and should not be used as the sole basis for treatment or other patient management decisions. A negative result may occur with  improper specimen collection/handling, submission of specimen other than  nasopharyngeal swab, presence of viral mutation(s) within the areas targeted by this assay, and inadequate number of viral copies(<138 copies/mL). A negative result must be combined with clinical observations, patient history, and epidemiological information. The expected result is Negative.  Fact Sheet for Patients:  EntrepreneurPulse.com.au  Fact Sheet for Healthcare Providers:  IncredibleEmployment.be  This test is no t yet approved or cleared by the [place] FDA and  has been authorized for detection and/or diagnosis of SARS-CoV-2 by FDA under an Emergency Use Authorization (EUA). This EUA will remain  in effect (meaning this test can be used) for the duration of the COVID-19 declaration under Section 564(b)(1) of the Act, 21 U.S.C.section 360bbb-3(b)(1), unless the authorization is terminated  or revoked sooner.       Influenza A by PCR NEGATIVE NEGATIVE Final   Influenza B by PCR NEGATIVE NEGATIVE Final    Comment: (NOTE) The Xpert Xpress SARS-CoV-2/FLU/RSV plus assay is intended as an aid in the diagnosis of influenza from Nasopharyngeal swab specimens and should not be used as a sole basis for treatment. Nasal washings and aspirates are unacceptable for Xpert Xpress SARS-CoV-2/FLU/RSV testing.  Fact Sheet for Patients: EntrepreneurPulse.com.au  Fact Sheet for Healthcare Providers: IncredibleEmployment.be  This test is not yet approved or cleared by the [place] FDA and has been authorized for detection and/or diagnosis of SARS-CoV-2 by FDA under an Emergency Use Authorization (EUA). This EUA will remain in effect (meaning this test can be used) for the duration of the COVID-19 declaration under Section 564(b)(1) of the Act, 21 U.S.C. section 360bbb-3(b)(1), unless the authorization is terminated or revoked.  Performed at [facility], [address] [place], [place]  [address]     RADIOLOGY:  [place] Shoulder Right  Result Date: 06/30/2020 CLINICAL DATA:  Pain and limited range of motion EXAM: RIGHT SHOULDER - 2+ VIEW COMPARISON:  June 29, 2020 FINDINGS: Frontal and Y scapular images obtained. There is a Hill-Sachs defect which appears acute. No other fracture. No dislocation. There is narrowing of the acromioclavicular joint. Glenohumeral joint appears unremarkable. Right lung clear. IMPRESSION: Acute appearing Hill-Sachs defect on the right. No other fracture. No dislocation. Narrowing acromioclavicular joint. Electronically Signed   By: Lowella Grip III M.D.   On: 06/30/2020 12:42   MR 3D Recon At Scanner  Result Date: 07/01/2020 CLINICAL DATA:  Elevated liver function studies. Alcohol withdrawal seizures. EXAM: MRI ABDOMEN WITHOUT AND WITH CONTRAST (INCLUDING MRCP) TECHNIQUE: Multiplanar multisequence MR imaging of the abdomen was performed both before and after the administration of intravenous contrast. Heavily T2-weighted images of the biliary and pancreatic ducts were obtained, and three-dimensional MRCP  images were rendered by post processing. CONTRAST:  50m GADAVIST GADOBUTROL 1 MMOL/ML IV SOLN COMPARISON:  Abdominal ultrasound 01/30/2020 FINDINGS: Examination is limited by breathing motion artifact. Lower chest: The lung bases are grossly clear. No pleural effusions or pericardial effusion. Hepatobiliary: Diffuse small scattered hepatic cysts are noted. There is moderate diffuse fatty infiltration of the liver but no worrisome hepatic lesions or intrahepatic biliary dilatation. The gallbladder is unremarkable. No common bile duct dilatation. Normal caliber and course of the common bile duct. Pancreas:  No mass, inflammation or ductal dilatation. Spleen:  Normal size.  No focal lesions. Adrenals/Urinary Tract: Adrenal glands and kidneys are unremarkable. No renal lesions or hydronephrosis. Stomach/Bowel: The stomach, duodenum, visualized small bowel and  visualized colon are unremarkable. Vascular/Lymphatic: The aorta and branch vessels are unremarkable. The major venous structures are patent. No mesenteric or retroperitoneal mass or adenopathy. Other:  No ascites or abdominal wall hernia. Musculoskeletal: No significant bony findings. IMPRESSION: 1. Moderate diffuse fatty infiltration of the liver but no worrisome hepatic lesions or intrahepatic biliary dilatation. 2. Normal gallbladder and pancreaticobiliary tree. 3. No acute abdominal findings, mass lesions or adenopathy. Electronically Signed   By: PMarijo SanesM.D.   On: 07/01/2020 11:51   DG Shoulder Right Port  Result Date: 06/30/2020 CLINICAL DATA:  Possible shoulder dislocation EXAM: PORTABLE RIGHT SHOULDER COMPARISON:  06/30/2020, 06/29/2020 FINDINGS: No dislocation. Hill-Sachs deformity of the humeral head. AC joint is intact. The right lung is grossly clear. IMPRESSION: Negative for dislocation.  Hill-Sachs deformity Electronically Signed   By: KDonavan FoilM.D.   On: 06/30/2020 21:19   MR ABDOMEN MRCP W WO CONTAST  Result Date: 07/01/2020 CLINICAL DATA:  Elevated liver function studies. Alcohol withdrawal seizures. EXAM: MRI ABDOMEN WITHOUT AND WITH CONTRAST (INCLUDING MRCP) TECHNIQUE: Multiplanar multisequence MR imaging of the abdomen was performed both before and after the administration of intravenous contrast. Heavily T2-weighted images of the biliary and pancreatic ducts were obtained, and three-dimensional MRCP images were rendered by post processing. CONTRAST:  119mGADAVIST GADOBUTROL 1 MMOL/ML IV SOLN COMPARISON:  Abdominal ultrasound 01/30/2020 FINDINGS: Examination is limited by breathing motion artifact. Lower chest: The lung bases are grossly clear. No pleural effusions or pericardial effusion. Hepatobiliary: Diffuse small scattered hepatic cysts are noted. There is moderate diffuse fatty infiltration of the liver but no worrisome hepatic lesions or intrahepatic biliary  dilatation. The gallbladder is unremarkable. No common bile duct dilatation. Normal caliber and course of the common bile duct. Pancreas:  No mass, inflammation or ductal dilatation. Spleen:  Normal size.  No focal lesions. Adrenals/Urinary Tract: Adrenal glands and kidneys are unremarkable. No renal lesions or hydronephrosis. Stomach/Bowel: The stomach, duodenum, visualized small bowel and visualized colon are unremarkable. Vascular/Lymphatic: The aorta and branch vessels are unremarkable. The major venous structures are patent. No mesenteric or retroperitoneal mass or adenopathy. Other:  No ascites or abdominal wall hernia. Musculoskeletal: No significant bony findings. IMPRESSION: 1. Moderate diffuse fatty infiltration of the liver but no worrisome hepatic lesions or intrahepatic biliary dilatation. 2. Normal gallbladder and pancreaticobiliary tree. 3. No acute abdominal findings, mass lesions or adenopathy. Electronically Signed   By: P.Marijo Sanes.D.   On: 07/01/2020 11:51     Management plans discussed with the patient, family and they are in agreement.  CODE STATUS:     Code Status Orders  (From admission, onward)           Start     Ordered   06/29/20 0452  Full code  Continuous        06/29/20 0452           Code Status History     This patient has a current code status but no historical code status.       TOTAL TIME TAKING CARE OF THIS PATIENT: 35 minutes.    Loletha Grayer M.D on 07/01/2020 at 5:35 PM   [facility]  CC: Primary care physician; London Pepper, MD

## 2020-07-03 DIAGNOSIS — M25511 Pain in right shoulder: Secondary | ICD-10-CM | POA: Diagnosis not present

## 2020-07-03 DIAGNOSIS — M25512 Pain in left shoulder: Secondary | ICD-10-CM | POA: Diagnosis not present

## 2020-07-04 DIAGNOSIS — F411 Generalized anxiety disorder: Secondary | ICD-10-CM | POA: Diagnosis not present

## 2020-07-04 DIAGNOSIS — S4991XD Unspecified injury of right shoulder and upper arm, subsequent encounter: Secondary | ICD-10-CM | POA: Diagnosis not present

## 2020-07-04 DIAGNOSIS — F10231 Alcohol dependence with withdrawal delirium: Secondary | ICD-10-CM | POA: Diagnosis not present

## 2020-07-04 DIAGNOSIS — R7401 Elevation of levels of liver transaminase levels: Secondary | ICD-10-CM | POA: Diagnosis not present

## 2020-07-09 DIAGNOSIS — M25511 Pain in right shoulder: Secondary | ICD-10-CM | POA: Diagnosis not present

## 2020-07-15 DIAGNOSIS — M25511 Pain in right shoulder: Secondary | ICD-10-CM | POA: Diagnosis not present

## 2020-07-16 ENCOUNTER — Other Ambulatory Visit: Payer: Self-pay | Admitting: Orthopedic Surgery

## 2020-07-16 DIAGNOSIS — M25511 Pain in right shoulder: Secondary | ICD-10-CM

## 2020-08-04 ENCOUNTER — Ambulatory Visit
Admission: RE | Admit: 2020-08-04 | Discharge: 2020-08-04 | Disposition: A | Discharge status: Home / Self Care | Payer: BC Managed Care – PPO | Source: Ambulatory Visit | Attending: Orthopedic Surgery | Admitting: Orthopedic Surgery

## 2020-08-04 ENCOUNTER — Other Ambulatory Visit: Payer: Self-pay

## 2020-08-04 DIAGNOSIS — R937 Abnormal findings on diagnostic imaging of other parts of musculoskeletal system: Secondary | ICD-10-CM | POA: Diagnosis not present

## 2020-08-04 DIAGNOSIS — S4291XA Fracture of right shoulder girdle, part unspecified, initial encounter for closed fracture: Secondary | ICD-10-CM | POA: Diagnosis not present

## 2020-08-04 DIAGNOSIS — M25511 Pain in right shoulder: Secondary | ICD-10-CM

## 2020-08-04 DIAGNOSIS — S43004A Unspecified dislocation of right shoulder joint, initial encounter: Secondary | ICD-10-CM | POA: Diagnosis not present

## 2020-08-14 DIAGNOSIS — M25511 Pain in right shoulder: Secondary | ICD-10-CM | POA: Diagnosis not present

## 2020-08-26 DIAGNOSIS — S4991XD Unspecified injury of right shoulder and upper arm, subsequent encounter: Secondary | ICD-10-CM | POA: Diagnosis not present

## 2020-08-26 DIAGNOSIS — F329 Major depressive disorder, single episode, unspecified: Secondary | ICD-10-CM | POA: Diagnosis not present

## 2020-08-26 DIAGNOSIS — M25511 Pain in right shoulder: Secondary | ICD-10-CM | POA: Diagnosis not present

## 2020-08-26 DIAGNOSIS — F1099 Alcohol use, unspecified with unspecified alcohol-induced disorder: Secondary | ICD-10-CM | POA: Diagnosis not present

## 2020-08-26 DIAGNOSIS — R748 Abnormal levels of other serum enzymes: Secondary | ICD-10-CM | POA: Diagnosis not present

## 2020-08-26 DIAGNOSIS — Z01818 Encounter for other preprocedural examination: Secondary | ICD-10-CM | POA: Diagnosis not present

## 2020-08-26 DIAGNOSIS — S4991XA Unspecified injury of right shoulder and upper arm, initial encounter: Secondary | ICD-10-CM | POA: Diagnosis not present

## 2020-10-22 DIAGNOSIS — B369 Superficial mycosis, unspecified: Secondary | ICD-10-CM | POA: Diagnosis not present

## 2020-10-22 DIAGNOSIS — F329 Major depressive disorder, single episode, unspecified: Secondary | ICD-10-CM | POA: Diagnosis not present

## 2020-10-22 DIAGNOSIS — Z7189 Other specified counseling: Secondary | ICD-10-CM | POA: Diagnosis not present

## 2020-10-22 DIAGNOSIS — R748 Abnormal levels of other serum enzymes: Secondary | ICD-10-CM | POA: Diagnosis not present

## 2020-10-24 DIAGNOSIS — R748 Abnormal levels of other serum enzymes: Secondary | ICD-10-CM | POA: Diagnosis not present

## 2020-10-24 DIAGNOSIS — F1099 Alcohol use, unspecified with unspecified alcohol-induced disorder: Secondary | ICD-10-CM | POA: Diagnosis not present

## 2020-10-24 DIAGNOSIS — Z20822 Contact with and (suspected) exposure to covid-19: Secondary | ICD-10-CM | POA: Diagnosis not present

## 2020-12-19 ENCOUNTER — Emergency Department: Payer: BC Managed Care – PPO

## 2020-12-19 ENCOUNTER — Encounter: Payer: Self-pay | Admitting: Emergency Medicine

## 2020-12-19 ENCOUNTER — Emergency Department
Admission: EM | Admit: 2020-12-19 | Discharge: 2020-12-19 | Disposition: A | Discharge status: Home / Self Care | Payer: BC Managed Care – PPO | Attending: Emergency Medicine | Admitting: Emergency Medicine

## 2020-12-19 ENCOUNTER — Other Ambulatory Visit: Payer: Self-pay

## 2020-12-19 DIAGNOSIS — X58XXXA Exposure to other specified factors, initial encounter: Secondary | ICD-10-CM | POA: Insufficient documentation

## 2020-12-19 DIAGNOSIS — S43015A Anterior dislocation of left humerus, initial encounter: Secondary | ICD-10-CM | POA: Diagnosis not present

## 2020-12-19 DIAGNOSIS — S4992XA Unspecified injury of left shoulder and upper arm, initial encounter: Secondary | ICD-10-CM | POA: Diagnosis not present

## 2020-12-19 DIAGNOSIS — Z87891 Personal history of nicotine dependence: Secondary | ICD-10-CM | POA: Insufficient documentation

## 2020-12-19 DIAGNOSIS — F1020 Alcohol dependence, uncomplicated: Secondary | ICD-10-CM | POA: Diagnosis not present

## 2020-12-19 LAB — CBC
HCT: 42.3 % (ref 39.0–52.0)
Hemoglobin: 14.5 g/dL (ref 13.0–17.0)
MCH: 32.7 pg (ref 26.0–34.0)
MCHC: 34.3 g/dL (ref 30.0–36.0)
MCV: 95.3 fL (ref 80.0–100.0)
Platelets: 75 10*3/uL — ABNORMAL LOW (ref 150–400)
RBC: 4.44 MIL/uL (ref 4.22–5.81)
RDW: 14.1 % (ref 11.5–15.5)
WBC: 6.2 10*3/uL (ref 4.0–10.5)
nRBC: 0 % (ref 0.0–0.2)

## 2020-12-19 LAB — COMPREHENSIVE METABOLIC PANEL
ALT: 85 U/L — ABNORMAL HIGH (ref 0–44)
AST: 218 U/L — ABNORMAL HIGH (ref 15–41)
Albumin: 3.5 g/dL (ref 3.5–5.0)
Alkaline Phosphatase: 162 U/L — ABNORMAL HIGH (ref 38–126)
Anion gap: 11 (ref 5–15)
BUN: 10 mg/dL (ref 6–20)
CO2: 25 mmol/L (ref 22–32)
Calcium: 9.4 mg/dL (ref 8.9–10.3)
Chloride: 97 mmol/L — ABNORMAL LOW (ref 98–111)
Creatinine, Ser: 0.86 mg/dL (ref 0.61–1.24)
GFR, Estimated: >60 mL/min (ref >60)
Glucose, Bld: 156 mg/dL — ABNORMAL HIGH (ref 70–99)
Potassium: 4.4 mmol/L (ref 3.5–5.1)
Sodium: 133 mmol/L — ABNORMAL LOW (ref 135–145)
Total Bilirubin: 4.4 mg/dL — ABNORMAL HIGH (ref 0.3–1.2)
Total Protein: 8.2 g/dL — ABNORMAL HIGH (ref 6.5–8.1)

## 2020-12-19 MED ORDER — HYDROMORPHONE HCL 1 MG/ML IJ SOLN
1.0000 mg | Freq: Once | INTRAMUSCULAR | Status: AC
  Administered 2020-12-19: 1 mg via INTRAVENOUS
  Filled 2020-12-19: qty 1

## 2020-12-19 MED ORDER — MORPHINE SULFATE (PF) 2 MG/ML IV SOLN: 2.0000 mg | Freq: Once | INTRAVENOUS | Status: AC

## 2020-12-19 MED ORDER — CHLORDIAZEPOXIDE HCL 25 MG PO CAPS
25.0000 mg | ORAL_CAPSULE | Freq: Once | ORAL | Status: AC
  Administered 2020-12-19: 25 mg via ORAL
  Filled 2020-12-19: qty 1

## 2020-12-19 MED ORDER — PROPOFOL 10 MG/ML IV BOLUS
2.0000 mg/kg | Freq: Once | INTRAVENOUS | Status: AC
  Administered 2020-12-19: 140 mg via INTRAVENOUS
  Filled 2020-12-19: qty 20

## 2020-12-19 MED ORDER — LIDOCAINE HCL (PF) 1 % IJ SOLN
5.0000 mL | Freq: Once | INTRAMUSCULAR | Status: DC
  Filled 2020-12-19: qty 5

## 2020-12-19 MED ORDER — SODIUM CHLORIDE 0.9 % IV BOLUS
1000.0000 mL | Freq: Once | INTRAVENOUS | Status: AC
  Administered 2020-12-19: 1000 mL via INTRAVENOUS

## 2020-12-19 MED ORDER — BUPIVACAINE HCL 0.5 % IJ SOLN
10.0000 mL | Freq: Once | INTRAMUSCULAR | Status: DC
  Filled 2020-12-19 (×2): qty 10

## 2020-12-19 MED ORDER — OXYCODONE-ACETAMINOPHEN 5-325 MG PO TABS: 1.0000 | ORAL_TABLET | Freq: Once | ORAL | Status: DC

## 2020-12-19 MED ORDER — ONDANSETRON HCL 4 MG/2ML IJ SOLN
4.0000 mg | Freq: Once | INTRAMUSCULAR | Status: AC
  Administered 2020-12-19: 4 mg via INTRAVENOUS
  Filled 2020-12-19: qty 2

## 2020-12-19 MED ORDER — LORAZEPAM 2 MG/ML IJ SOLN
2.0000 mg | Freq: Once | INTRAMUSCULAR | Status: DC
  Filled 2020-12-19 (×2): qty 1

## 2020-12-19 MED ORDER — BUPIVACAINE HCL (PF) 0.5 % IJ SOLN
10.0000 mL | Freq: Once | INTRAMUSCULAR | Status: DC
  Filled 2020-12-19: qty 30

## 2020-12-19 MED ORDER — MORPHINE SULFATE (PF) 2 MG/ML IV SOLN
INTRAVENOUS | Status: AC
  Administered 2020-12-19: 2 mg via INTRAVENOUS
  Filled 2020-12-19: qty 1

## 2020-12-19 MED ORDER — CHLORDIAZEPOXIDE HCL 10 MG PO CAPS: 10.0000 mg | ORAL_CAPSULE | Freq: Four times a day (QID) | ORAL | 0 refills | Status: DC | PRN

## 2020-12-19 NOTE — ED Notes (Signed)
Pt NAD, a/ox4. Pt and spouse verbalizes understanding of all DC and f/u instructions. All questions answered. Pt wheeled out in w/c by spouse.

## 2020-12-19 NOTE — ED Notes (Signed)
Pt moderate distress noted in facial grimmacing. A/ox4. Pt states he has had his left shoulder dislocate many times before and today he was sleeping and woke up with it being dislocated. Denies any injury.+pmsc.

## 2020-12-19 NOTE — ED Triage Notes (Signed)
Pt here with c/o left shoulder pain, states injury while taking a nap today, hx of shoulder dislocation bilateral shoulders, denies numbness and tingling in fingertips. Wife states pt has been "coming off of alcohol and having tremors today." NAD.

## 2020-12-19 NOTE — ED Notes (Signed)
Sedation end

## 2020-12-19 NOTE — ED Provider Notes (Signed)
[facility] Injury [facility]  Date/Time: 12/19/2020 10:48 PM Performed by: Sharman Cheek, MD Authorized by: Sharman Cheek, MD   Consent:    Consent obtained:  Verbal and written   Consent given by:  Patient   Risks discussed:  Restricted joint movement, irreducible dislocation and recurrent dislocation   Alternatives discussed:  Immobilization and referralInjury location: shoulder Location details: left shoulder Injury type: dislocation Dislocation type: anterior Hill-Sachs deformity: no Chronicity: recurrent Pre-procedure neurovascular assessment: neurovascularly intact Pre-procedure distal perfusion: normal Pre-procedure neurological function: normal Pre-procedure range of motion: reduced  Anesthesia: Local anesthesia used: no  Patient sedated: Yes. Refer to sedation procedure documentation for details of sedation. Manipulation performed: yes Reduction method: external rotation Reduction successful: yes X-ray confirmed reduction: yes Immobilization: sling (shoulder immobilizer) Splint Applied by: ED Nurse and ED Provider Post-procedure neurovascular assessment: post-procedure neurovascularly intact Post-procedure distal perfusion: normal Post-procedure neurological function: normal Post-procedure range of motion: normal Comments:      .Sedation  Date/Time: 12/19/2020 10:50 PM Performed by: Sharman Cheek, MD Authorized by: Sharman Cheek, MD   Consent:    Consent obtained:  Verbal and written   Consent given by:  Patient   Risks discussed:  Allergic reaction, inadequate sedation, nausea, vomiting, respiratory compromise necessitating ventilatory assistance and intubation and prolonged hypoxia resulting in organ damage   Alternatives discussed:  Analgesia without sedation, anxiolysis and regional anesthesia Universal protocol:    Procedure explained and questions answered to patient or proxy's satisfaction: yes     Site/side marked: yes     Immediately  prior to procedure, a time out was called: yes     Patient identity confirmed:  Arm band and verbally with patient Indications:    Procedure performed:  Dislocation reduction   Procedure necessitating sedation performed by:  Physician performing sedation Pre-sedation assessment:    Time since last food or drink:  6 hours   ASA classification: class 2 - patient with mild systemic disease     Mouth opening:  3 or more finger widths   Thyromental distance:  4 finger widths   Mallampati score:  I - soft palate, uvula, fauces, pillars visible   Neck mobility: normal     Pre-sedation assessments completed and reviewed: airway patency, cardiovascular function, hydration status, mental status, nausea/vomiting, pain level and respiratory function     Pre-sedation assessment completed:  12/19/2020 9:00 PM Immediate pre-procedure details:    Reassessment: Patient reassessed immediately prior to procedure     Reviewed: vital signs, relevant labs/tests and NPO status     Verified: bag valve mask available, emergency equipment available, intubation equipment available, IV patency confirmed, oxygen available and suction available   Procedure details (see MAR for exact dosages):    Preoxygenation:  Nasal cannula   Sedation:  Propofol   Intended level of sedation: deep   Analgesia:  Hydromorphone   Intra-procedure monitoring:  Blood pressure monitoring, cardiac monitor, continuous pulse oximetry, continuous capnometry, frequent LOC assessments and frequent vital sign checks   Intra-procedure events: none     Total Provider sedation time (minutes):  20 Post-procedure details:    Post-sedation assessment completed:  12/19/2020 10:52 PM   Attendance: Constant attendance by certified staff until patient recovered     Recovery: Patient returned to pre-procedure baseline     Post-sedation assessments completed and reviewed: airway patency, cardiovascular function, hydration status, mental status,  nausea/vomiting, pain level and respiratory function     Patient is stable for discharge or admission: yes     Procedure completion:  Tolerated well, no immediate complications  Clinical Course as of 12/19/20 2248  [facility] Dec 19, 2020  2201 Shoulder reduction complete. Pt awakening. No complications.  [PS]    Clinical Course User Index [PS] Sharman Cheek, MD    ----------------------------------------- 10:48 PM on 12/19/2020 ----------------------------------------- Medical screening examination/treatment/procedure(s) were conducted as a shared visit with non-physician practitioner(s) and myself.  I personally evaluated the patient during the encounter. I personally completed history, physical exam, and medical decision making with the patient.    ----------------------------------------- 10:54 PM on 12/19/2020 ----------------------------------------- Patient back to baseline.  Follow-up x-ray confirms reduction successful.  Shoulder immobilizer in place.  sedation discharge instructions provided.  Additionally, patient suffers from alcohol dependence.  He is trying to wean himself off of alcohol at home with gradual taper.  I offered Librium taper which she agrees with.  He is reliable, spouse is at his bedside and understands and is helping him manage his alcohol cessation.  He plans to seek professional help with recovery if he is unsuccessful managing this at home.  Final diagnoses:  Anterior shoulder dislocation, left, initial encounter  Uncomplicated alcohol dependence (HCC)      Sharman Cheek, MD 12/19/20 2256

## 2020-12-19 NOTE — ED Provider Notes (Signed)
[facility] EMERGENCY DEPARTMENT Provider Note   CSN: [id number] Arrival date & time: 12/19/20  1712     History Chief Complaint  Patient presents with   Shoulder Injury    Andrew Cummings is a 40 y.o. male with left anterior inferior shoulder dislocation that occurred earlier today.  Patient also with alcohol withdrawal symptoms per wife.  Patient has not been taking any medications.  Shoulder dislocation occurred while sleeping.  Has had numerous bilateral shoulder dislocations in the past.  No numbness or tingling left upper extremity.  HPI     Past Medical History:  Diagnosis Date   History of closed shoulder dislocation    bilateral    Patient Active Problem List   Diagnosis Date Noted   Abnormal LFTs (liver function tests)    Laceration of tongue    Thrombocytopenia (HCC)    Alcohol withdrawal seizure (HCC) 06/29/2020   Alcoholic ketoacidosis 06/29/2020   Dislocation of right shoulder joint 06/29/2020   Transaminitis    Hypomagnesemia    Hypokalemia     History reviewed. No pertinent surgical history.     No family history on file.  Social History   Tobacco Use   Smoking status: Former    Types: Cigarettes   Smokeless tobacco: Never  Vaping Use   Vaping Use: Never used  Substance Use Topics   Alcohol use: Yes    Comment: excessive drinking on weekends, 1 drink a day on week days   Drug use: Not Currently    Home Medications Prior to Admission medications   Medication Sig Start Date End Date Taking? Authorizing Provider  FLUoxetine (PROZAC) 10 MG capsule Take 10 mg by mouth daily.    [provider]  folic acid (FOLVITE) 1 MG tablet Take 1 tablet (1 mg total) by mouth daily. 07/02/20   Alford Highland, MD  LORazepam (ATIVAN) 0.5 MG tablet One tab po three times a day as needed for shakiness day 1; on tab po twice a day as needed day2,3; one tab po daily as needed for shakiness day4,5 07/01/20   Alford Highland, MD   Multiple Vitamin (MULTIVITAMIN WITH MINERALS) TABS tablet Take 1 tablet by mouth daily. 07/02/20   Alford Highland, MD  omeprazole (PRILOSEC OTC) 20 MG tablet Take 20 mg by mouth daily as needed (acid reflux symptoms).    [provider]  thiamine 100 MG tablet Take 1 tablet (100 mg total) by mouth daily. 07/02/20   Alford Highland, MD  traZODone (DESYREL) 50 MG tablet Take 1 tablet (50 mg total) by mouth at bedtime. 07/01/20   Alford Highland, MD    Allergies    Patient has no known allergies.  Review of Systems   Review of Systems  Constitutional:  Negative for fever.  Musculoskeletal:  Positive for arthralgias.  Neurological:  Negative for numbness.   Physical Exam Updated Vital Signs Pulse (!) 59   Temp 97.9 F (36.6 C) (Oral)   Resp 16   Ht 5\' 11"  (1.803 m)   Wt 89.4 kg   BMI 27.48 kg/m   Physical Exam Constitutional:      Appearance: He is well-developed.  HENT:     Head: Normocephalic and atraumatic.  Eyes:     Conjunctiva/sclera: Conjunctivae normal.  Cardiovascular:     Rate and Rhythm: Normal rate.  Pulmonary:     Effort: Pulmonary effort is normal. No respiratory distress.  Musculoskeletal:     Cervical back: Normal range of motion.  Comments: Neurovascular tact in left upper extremity with palpable anterior deformity of humeral head.  Patient guarding left upper extremity.  No pain throughout the elbow forearm wrist and digits in the left upper extremity.  Skin:    General: Skin is warm.     Findings: No rash.  Neurological:     Mental Status: He is alert and oriented to person, place, and time.  Psychiatric:        Behavior: Behavior normal.        Thought Content: Thought content normal.    ED Results / Procedures / Treatments   Labs (all labs ordered are listed, but only abnormal results are displayed) Labs Reviewed  CBC - Abnormal; Notable for the following components:      Result Value   Platelets 75 (*)    All other components  within normal limits  COMPREHENSIVE METABOLIC PANEL - Abnormal; Notable for the following components:   Sodium 133 (*)    Chloride 97 (*)    Glucose, Bld 156 (*)    Total Protein 8.2 (*)    AST 218 (*)    ALT 85 (*)    Alkaline Phosphatase 162 (*)    Total Bilirubin 4.4 (*)    All other components within normal limits    EKG None  Radiology DG Shoulder Left  Result Date: 12/19/2020 CLINICAL DATA:  Left shoulder dislocation EXAM: LEFT SHOULDER - 2+ VIEW COMPARISON:  None. FINDINGS: Left anterior/inferior shoulder dislocation. No fracture is seen. The visualized soft tissues are unremarkable. Visualized left lung is clear. IMPRESSION: Left anterior/inferior shoulder dislocation. Electronically Signed   By: Charline Bills M.D.   On: 12/19/2020 19:00    Procedures .Ortho Injury Treatment  Date/Time: 12/19/2020 7:23 PM Performed by: Evon Slack, PA-C Authorized by: Evon Slack, PA-C   Consent:    Consent obtained:  Verbal   Consent given by:  Patient   Risks discussed:  Recurrent dislocationInjury location: shoulder Injury type: dislocation Dislocation type: anterior Hill-Sachs deformity: no Chronicity: recurrent Pre-procedure neurovascular assessment: neurovascularly intact Anesthesia: local infiltration  Anesthesia: Local anesthesia used: yes Local Anesthetic: bupivacaine 0.5% without epinephrine and lidocaine 1% without epinephrine Anesthetic total: 15 mL Post-procedure neurovascular assessment: post-procedure neurovascularly intact Comments: Patient with improvement in pain but still not willing to attempt reduction     Medications Ordered in ED Medications  LORazepam (ATIVAN) injection 2 mg (has no administration in time range)  lidocaine (PF) (XYLOCAINE) 1 % injection 5 mL (has no administration in time range)  bupivacaine (MARCAINE) 0.5 % injection 10 mL (has no administration in time range)  lidocaine (PF) (XYLOCAINE) 1 % injection 5 mL (has no  administration in time range)  morphine 2 MG/ML injection 2 mg (2 mg Intravenous Given 12/19/20 1805)    ED Course  I have reviewed the triage vital signs and the nursing notes.  Pertinent labs & imaging results that were available during my care of the patient were reviewed by me and considered in my medical decision making (see chart for details).    MDM Rules/Calculators/A&P                          Final Clinical Impression(s) / ED Diagnoses Final diagnoses:  Anterior shoulder dislocation, left, initial encounter   Left shoulder anterior dislocation.  Patient agreed and consented to a left glenohumeral injection for pain relief.  20 minutes following joint injection with lidocaine/bupivicaine, pain improved but patient not  able to tolerate reduction. Rx / DC Orders ED Discharge Orders     None        Ronnette Juniper 12/19/20 Zachery Dauer, MD 12/19/20 2013

## 2020-12-19 NOTE — ED Provider Notes (Signed)
Emergency Medicine Provider Triage Evaluation Note  Andrew Cummings , a 40 y.o. male  was evaluated in triage.  Pt complains of left shoulder pain.  He believes he dislocated his shoulder while sleeping.  No falls or injury.  History of numerous bilateral shoulder dislocations.  Patient has also been cutting back from drinking, history of alcoholism.  Has had 1 glass of wine today.  Wife states he is starting to shake a little bit in the left hand and is worried about withdrawals.  Patient denies any numbness or tingling in the left upper extremity.  No head injury..  Review of Systems  Positive: Left shoulder pain Negative: Numbness and tingling  Physical Exam  Pulse (!) 59   Temp 97.9 F (36.6 C) (Oral)   Resp 16   Ht 5\' 11"  (1.803 m)   Wt 89.4 kg   BMI 27.48 kg/m  Gen:   Awake, no distress   Resp:  Normal effort  MSK:   Palpable deformity to the left shoulder consistent with anterior dislocation.  Neurovascular intact in left upper extremity Other:    Medical Decision Making  Medically screening exam initiated at 6:06 PM.  Appropriate orders placed.  Andrew Cummings was informed that the remainder of the evaluation will be completed by another provider, this initial triage assessment does not replace that evaluation, and the importance of remaining in the ED until their evaluation is complete.     Juliene Pina, PA-C 12/19/20 14/02/22, MD 12/19/20 2013

## 2020-12-19 NOTE — ED Notes (Signed)
Pt taken to xray 

## 2020-12-19 NOTE — ED Notes (Signed)
Pt tolerates procedure well, sling applied. +pmsc

## 2021-01-19 ENCOUNTER — Emergency Department
Admission: EM | Admit: 2021-01-19 | Discharge: 2021-01-19 | Disposition: A | Discharge status: Home / Self Care | Payer: BC Managed Care – PPO | Attending: Emergency Medicine | Admitting: Emergency Medicine

## 2021-01-19 ENCOUNTER — Other Ambulatory Visit: Payer: Self-pay

## 2021-01-19 DIAGNOSIS — F10239 Alcohol dependence with withdrawal, unspecified: Secondary | ICD-10-CM | POA: Diagnosis not present

## 2021-01-19 DIAGNOSIS — Z79899 Other long term (current) drug therapy: Secondary | ICD-10-CM | POA: Insufficient documentation

## 2021-01-19 DIAGNOSIS — F10930 Alcohol use, unspecified with withdrawal, uncomplicated: Secondary | ICD-10-CM | POA: Diagnosis not present

## 2021-01-19 DIAGNOSIS — F1020 Alcohol dependence, uncomplicated: Secondary | ICD-10-CM | POA: Diagnosis not present

## 2021-01-19 DIAGNOSIS — M14812 Arthropathies in other specified diseases classified elsewhere, left shoulder: Secondary | ICD-10-CM | POA: Diagnosis not present

## 2021-01-19 DIAGNOSIS — F419 Anxiety disorder, unspecified: Secondary | ICD-10-CM | POA: Diagnosis not present

## 2021-01-19 DIAGNOSIS — M14811 Arthropathies in other specified diseases classified elsewhere, right shoulder: Secondary | ICD-10-CM | POA: Diagnosis not present

## 2021-01-19 DIAGNOSIS — F329 Major depressive disorder, single episode, unspecified: Secondary | ICD-10-CM | POA: Diagnosis not present

## 2021-01-19 DIAGNOSIS — Y902 Blood alcohol level of 40-59 mg/100 ml: Secondary | ICD-10-CM | POA: Diagnosis not present

## 2021-01-19 HISTORY — DX: Alcohol abuse, uncomplicated: F10.10

## 2021-01-19 LAB — COMPREHENSIVE METABOLIC PANEL
ALT: 64 U/L — ABNORMAL HIGH (ref 0–44)
AST: 211 U/L — ABNORMAL HIGH (ref 15–41)
Albumin: 3.7 g/dL (ref 3.5–5.0)
Alkaline Phosphatase: 187 U/L — ABNORMAL HIGH (ref 38–126)
Anion gap: 11 (ref 5–15)
BUN: <5 mg/dL — ABNORMAL LOW (ref 6–20)
CO2: 26 mmol/L (ref 22–32)
Calcium: 9 mg/dL (ref 8.9–10.3)
Chloride: 101 mmol/L (ref 98–111)
Creatinine, Ser: 0.55 mg/dL — ABNORMAL LOW (ref 0.61–1.24)
GFR, Estimated: >60 mL/min (ref >60)
Glucose, Bld: 98 mg/dL (ref 70–99)
Potassium: 3.7 mmol/L (ref 3.5–5.1)
Sodium: 138 mmol/L (ref 135–145)
Total Bilirubin: 3.4 mg/dL — ABNORMAL HIGH (ref 0.3–1.2)
Total Protein: 8.6 g/dL — ABNORMAL HIGH (ref 6.5–8.1)

## 2021-01-19 LAB — URINE DRUG SCREEN, QUALITATIVE (ARMC ONLY)
Amphetamines, Ur Screen: NOT DETECTED
Barbiturates, Ur Screen: NOT DETECTED
Benzodiazepine, Ur Scrn: POSITIVE — AB
Cannabinoid 50 Ng, Ur ~~LOC~~: NOT DETECTED
Cocaine Metabolite,Ur ~~LOC~~: NOT DETECTED
MDMA (Ecstasy)Ur Screen: NOT DETECTED
Methadone Scn, Ur: NOT DETECTED
Opiate, Ur Screen: NOT DETECTED
Phencyclidine (PCP) Ur S: NOT DETECTED
Tricyclic, Ur Screen: NOT DETECTED

## 2021-01-19 LAB — ETHANOL: Alcohol, Ethyl (B): 43 mg/dL — ABNORMAL HIGH (ref <10)

## 2021-01-19 LAB — CBC
HCT: 39.3 % (ref 39.0–52.0)
Hemoglobin: 13.7 g/dL (ref 13.0–17.0)
MCH: 33.3 pg (ref 26.0–34.0)
MCHC: 34.9 g/dL (ref 30.0–36.0)
MCV: 95.4 fL (ref 80.0–100.0)
Platelets: 45 10*3/uL — ABNORMAL LOW (ref 150–400)
RBC: 4.12 MIL/uL — ABNORMAL LOW (ref 4.22–5.81)
RDW: 14.6 % (ref 11.5–15.5)
WBC: 5.2 10*3/uL (ref 4.0–10.5)
nRBC: 0 % (ref 0.0–0.2)

## 2021-01-19 LAB — SALICYLATE LEVEL: Salicylate Lvl: <7 mg/dL — ABNORMAL LOW (ref 7.0–30.0)

## 2021-01-19 LAB — ACETAMINOPHEN LEVEL: Acetaminophen (Tylenol), Serum: <10 ug/mL — ABNORMAL LOW (ref 10–30)

## 2021-01-19 MED ORDER — LORAZEPAM 2 MG PO TABS: 0.0000 mg | ORAL_TABLET | Freq: Four times a day (QID) | ORAL | Status: DC

## 2021-01-19 MED ORDER — SODIUM CHLORIDE 0.9 % IV BOLUS
1000.0000 mL | Freq: Once | INTRAVENOUS | Status: AC
  Administered 2021-01-19: 1000 mL via INTRAVENOUS

## 2021-01-19 MED ORDER — LORAZEPAM 2 MG/ML IJ SOLN: 0.0000 mg | Freq: Two times a day (BID) | INTRAMUSCULAR | Status: DC

## 2021-01-19 MED ORDER — ONDANSETRON HCL 4 MG/2ML IJ SOLN
4.0000 mg | Freq: Once | INTRAMUSCULAR | Status: AC
  Administered 2021-01-19: 4 mg via INTRAVENOUS
  Filled 2021-01-19: qty 2

## 2021-01-19 MED ORDER — LORAZEPAM 2 MG/ML IJ SOLN
0.0000 mg | Freq: Four times a day (QID) | INTRAMUSCULAR | Status: DC
  Administered 2021-01-19: 2 mg via INTRAVENOUS
  Filled 2021-01-19: qty 1

## 2021-01-19 MED ORDER — THIAMINE HCL 100 MG/ML IJ SOLN: 100.0000 mg | Freq: Every day | INTRAMUSCULAR | Status: DC

## 2021-01-19 MED ORDER — THIAMINE HCL 100 MG PO TABS: 100.0000 mg | ORAL_TABLET | Freq: Every day | ORAL | Status: DC

## 2021-01-19 MED ORDER — LORAZEPAM 2 MG PO TABS: 0.0000 mg | ORAL_TABLET | Freq: Two times a day (BID) | ORAL | Status: DC

## 2021-01-19 NOTE — ED Provider Notes (Signed)
°  Emergency Medicine Provider Triage Evaluation Note  Andrew Cummings , a 40 y.o.male,  was evaluated in triage.  Pt complains of withdrawal symptoms.  Patient used to be a daily drinker, but stopped 4 days ago.  Currently endorsing nausea/vomiting and generalized abdominal discomfort.  Denies chest pain, shortness of breath, back pain, or urinary symptoms.   Review of Systems  Positive: Nausea/vomiting, abdominal pain Negative: Denies fever, chest pain, urinary symptoms  Physical Exam   Vitals:   01/19/21 1320 01/19/21 1325  BP: (!) 147/90   Pulse: 83   Resp: 18   Temp:  98.3 F (36.8 C)  SpO2: 95%    Gen:   Awake, uncomfortable Resp:  Normal effort  MSK:   Moves extremities without difficulty  Other:    Medical Decision Making  Given the patient's initial medical screening exam, the following diagnostic evaluation has been ordered. The patient will be placed in the appropriate treatment space, once one is available, to complete the evaluation and treatment. I have discussed the plan of care with the patient and I have advised the patient that an ED physician or mid-level practitioner will reevaluate their condition after the test results have been received, as the results may give them additional insight into the type of treatment they may need.    Diagnostics: Labs  Treatments: Fluids, Ativan, Zofran   Varney Daily, [place] 01/19/21 1341    Shaune Pollack, MD 01/19/21 1719

## 2021-01-19 NOTE — ED Notes (Signed)
E-signature not working at this time. Pt verbalized understanding of D/C instructions, prescriptions and follow up care with no further questions at this time. Pt in NAD and ambulatory at time of D/C.  Pt wife at bedside. Pt wife to transport patient to detox facility. MD Siadecki at bedside to ensure safe disposition plan.

## 2021-01-19 NOTE — ED Provider Notes (Addendum)
[facility] Provider Note    Event Date/Time   First MD Initiated Contact with Patient 01/19/21 1642     (approximate)   History   Withdrawal   HPI  Andrew Cummings is a 41 y.o. male with a history of alcohol abuse who presents with alcohol withdrawal.  The patient states that his last drink was 4 days ago and he has been feeling shaky and nauseous but not confused.  The patient states that he has had delirium tremens before.  While in the waiting room, he and his family member arranged for him to go to [facility] for inpatient detox.  He denies any other acute complaints and states he feels ready for discharge.      Physical Exam   Triage Vital Signs: ED Triage Vitals  Enc Vitals Group     BP 01/19/21 1320 (!) 147/90     Pulse Rate 01/19/21 1320 83     Resp 01/19/21 1320 18     Temp 01/19/21 1325 98.3 F (36.8 C)     Temp Source 01/19/21 1320 Oral     SpO2 01/19/21 1320 95 %     Weight 01/19/21 1321 195 lb (88.5 kg)     Height 01/19/21 1321 5\' 11"  (1.803 m)     Head Circumference --      Peak Flow --      Pain Score 01/19/21 1321 0     Pain Loc --      Pain Edu? --      Excl. in [place]? --     Most recent vital signs: Vitals:   01/19/21 1325 01/19/21 1644  BP:  135/70  Pulse:  80  Resp:  17  Temp: 98.3 F (36.8 C) 98.3 F (36.8 C)  SpO2:  95%    General: Alert, oriented, no distress.  CV:  Good peripheral perfusion.  Resp:  Normal effort.  Abd:  No distention.  Other:  No tremor or asterixis.   ED Results / Procedures / Treatments   Labs (all labs ordered are listed, but only abnormal results are displayed) Labs Reviewed  COMPREHENSIVE METABOLIC PANEL - Abnormal; Notable for the following components:      Result Value   BUN <5 (*)    Creatinine, Ser 0.55 (*)    Total Protein 8.6 (*)    AST 211 (*)    ALT 64 (*)    Alkaline Phosphatase 187 (*)    Total Bilirubin 3.4 (*)    All other components within normal limits   ETHANOL - Abnormal; Notable for the following components:   Alcohol, Ethyl (B) 43 (*)    All other components within normal limits  SALICYLATE LEVEL - Abnormal; Notable for the following components:   Salicylate Lvl [id number] (*)    All other components within normal limits  ACETAMINOPHEN LEVEL - Abnormal; Notable for the following components:   Acetaminophen (Tylenol), Serum <10 (*)    All other components within normal limits  CBC - Abnormal; Notable for the following components:   RBC 4.12 (*)    Platelets 45 (*)    All other components within normal limits  URINE DRUG SCREEN, QUALITATIVE ([facility] ONLY) - Abnormal; Notable for the following components:   Benzodiazepine, Ur Scrn POSITIVE (*)    All other components within normal limits     EKG     RADIOLOGY   PROCEDURES:  Critical Care performed: No  Procedures   MEDICATIONS ORDERED IN ED:  Medications  LORazepam (ATIVAN) injection 0-4 mg (2 mg Intravenous Given 01/19/21 1341)    Or  LORazepam (ATIVAN) tablet 0-4 mg ( Oral See Alternative 01/19/21 1341)  LORazepam (ATIVAN) injection 0-4 mg (has no administration in time range)    Or  LORazepam (ATIVAN) tablet 0-4 mg (has no administration in time range)  thiamine tablet 100 mg (100 mg Oral Patient Refused/Not Given 01/19/21 1642)    Or  thiamine (B-1) injection 100 mg ( Intravenous See Alternative 01/19/21 1642)  sodium chloride 0.9 % bolus 1,000 mL (0 mLs Intravenous Stopped 01/19/21 1556)  ondansetron (ZOFRAN) injection 4 mg (4 mg Intravenous Given 01/19/21 1341)     IMPRESSION / MDM / ASSESSMENT AND PLAN / ED COURSE  I reviewed the triage vital signs and the nursing notes.   Differential diagnosis includes, but is not limited to, alcohol withdrawal.  The patient presents with symptoms of alcohol withdrawal after stopping drinking several days ago.  He received a single dose of Ativan and some fluids while waiting to be seen.  While waiting, the patient and his family member  arranged for him to go to [facility] for detox, and he is requesting discharge.  On exam he is alert and oriented and well-appearing.  His vital signs are normal.  He does not demonstrate any exam findings of active withdrawal.  I reviewed the lab work-up, which is unremarkable except for elevated LFTs (which are chronic for the patient and unchanged today) and thrombocytopenia which is also chronic for the patient and likely due to his chronic alcohol abuse and liver disease.  The platelets are below 50,000 but the patient does not demonstrate any signs or symptoms of bleeding and there is no indication for transfusion or other acute intervention at this time.  I reviewed the outside records including a discharge summary from the [facility] hospitalist from June of last year which discusses the known elevated LFTs and thrombocytopenia.  At this time, the patient is stable for discharge and will travel by POV with a family member to detox.  I gave the patient very thorough return precautions and emphasized that if he is unable to get into the detox today or has any other problems, he may return at any time to resume his care in the ED.      FINAL CLINICAL IMPRESSION(S) / ED DIAGNOSES   Final diagnoses:  Alcohol withdrawal syndrome without complication ([place])     Rx / DC Orders   ED Discharge Orders     None        Note:  This document was prepared using Dragon voice recognition software and may include unintentional dictation errors.    Arta Silence, MD 01/19/21 1705    Arta Silence, MD 01/19/21 1707    Arta Silence, MD 01/19/21 [id number]

## 2021-01-19 NOTE — ED Triage Notes (Signed)
Pt states he is a daily drinker and stopped drinking about 4 days ago and has had non stop emesis with abd pain, trouble sleeping, pt is clammy at present with tremors. Pt is a/ox4 on arrival, states he has a hx of seizures with withdrawals in the past

## 2021-01-19 NOTE — Discharge Instructions (Signed)
Proceed to [facility] this afternoon as planned.  Return to the ER immediately if you are unable to get in there, or develop worsening shakiness, anxiety, nausea, confusion, or any other new or worsening symptoms that concern you.

## 2021-01-23 DIAGNOSIS — M14812 Arthropathies in other specified diseases classified elsewhere, left shoulder: Secondary | ICD-10-CM | POA: Diagnosis not present

## 2021-01-23 DIAGNOSIS — M14811 Arthropathies in other specified diseases classified elsewhere, right shoulder: Secondary | ICD-10-CM | POA: Diagnosis not present

## 2021-01-23 DIAGNOSIS — F419 Anxiety disorder, unspecified: Secondary | ICD-10-CM | POA: Diagnosis not present

## 2021-01-23 DIAGNOSIS — F329 Major depressive disorder, single episode, unspecified: Secondary | ICD-10-CM | POA: Diagnosis not present

## 2021-01-23 DIAGNOSIS — F1020 Alcohol dependence, uncomplicated: Secondary | ICD-10-CM | POA: Diagnosis not present

## 2021-01-27 ENCOUNTER — Other Ambulatory Visit: Payer: Self-pay | Admitting: *Deleted

## 2021-01-27 ENCOUNTER — Ambulatory Visit
Admission: RE | Admit: 2021-01-27 | Discharge: 2021-01-27 | Disposition: A | Discharge status: Home / Self Care | Payer: No Typology Code available for payment source | Source: Ambulatory Visit | Attending: *Deleted | Admitting: *Deleted

## 2021-01-27 DIAGNOSIS — R7611 Nonspecific reaction to tuberculin skin test without active tuberculosis: Secondary | ICD-10-CM

## 2021-02-13 DIAGNOSIS — M14812 Arthropathies in other specified diseases classified elsewhere, left shoulder: Secondary | ICD-10-CM | POA: Diagnosis not present

## 2021-02-13 DIAGNOSIS — M14811 Arthropathies in other specified diseases classified elsewhere, right shoulder: Secondary | ICD-10-CM | POA: Diagnosis not present

## 2021-02-13 DIAGNOSIS — F419 Anxiety disorder, unspecified: Secondary | ICD-10-CM | POA: Diagnosis not present

## 2021-02-13 DIAGNOSIS — F1020 Alcohol dependence, uncomplicated: Secondary | ICD-10-CM | POA: Diagnosis not present

## 2021-02-13 DIAGNOSIS — F329 Major depressive disorder, single episode, unspecified: Secondary | ICD-10-CM | POA: Diagnosis not present

## 2021-02-14 DIAGNOSIS — M14812 Arthropathies in other specified diseases classified elsewhere, left shoulder: Secondary | ICD-10-CM | POA: Diagnosis not present

## 2021-02-14 DIAGNOSIS — M14811 Arthropathies in other specified diseases classified elsewhere, right shoulder: Secondary | ICD-10-CM | POA: Diagnosis not present

## 2021-02-14 DIAGNOSIS — F1020 Alcohol dependence, uncomplicated: Secondary | ICD-10-CM | POA: Diagnosis not present

## 2021-02-14 DIAGNOSIS — F419 Anxiety disorder, unspecified: Secondary | ICD-10-CM | POA: Diagnosis not present

## 2021-02-14 DIAGNOSIS — F329 Major depressive disorder, single episode, unspecified: Secondary | ICD-10-CM | POA: Diagnosis not present

## 2021-02-15 DIAGNOSIS — F1020 Alcohol dependence, uncomplicated: Secondary | ICD-10-CM | POA: Diagnosis not present

## 2021-02-15 DIAGNOSIS — F329 Major depressive disorder, single episode, unspecified: Secondary | ICD-10-CM | POA: Diagnosis not present

## 2021-02-15 DIAGNOSIS — M14811 Arthropathies in other specified diseases classified elsewhere, right shoulder: Secondary | ICD-10-CM | POA: Diagnosis not present

## 2021-02-15 DIAGNOSIS — M14812 Arthropathies in other specified diseases classified elsewhere, left shoulder: Secondary | ICD-10-CM | POA: Diagnosis not present

## 2021-02-15 DIAGNOSIS — F419 Anxiety disorder, unspecified: Secondary | ICD-10-CM | POA: Diagnosis not present

## 2021-02-16 DIAGNOSIS — F1020 Alcohol dependence, uncomplicated: Secondary | ICD-10-CM | POA: Diagnosis not present

## 2021-02-16 DIAGNOSIS — M14812 Arthropathies in other specified diseases classified elsewhere, left shoulder: Secondary | ICD-10-CM | POA: Diagnosis not present

## 2021-02-16 DIAGNOSIS — F329 Major depressive disorder, single episode, unspecified: Secondary | ICD-10-CM | POA: Diagnosis not present

## 2021-02-16 DIAGNOSIS — M14811 Arthropathies in other specified diseases classified elsewhere, right shoulder: Secondary | ICD-10-CM | POA: Diagnosis not present

## 2021-02-16 DIAGNOSIS — F419 Anxiety disorder, unspecified: Secondary | ICD-10-CM | POA: Diagnosis not present

## 2021-02-17 DIAGNOSIS — F419 Anxiety disorder, unspecified: Secondary | ICD-10-CM | POA: Diagnosis not present

## 2021-02-17 DIAGNOSIS — F1020 Alcohol dependence, uncomplicated: Secondary | ICD-10-CM | POA: Diagnosis not present

## 2021-02-17 DIAGNOSIS — F329 Major depressive disorder, single episode, unspecified: Secondary | ICD-10-CM | POA: Diagnosis not present

## 2021-02-17 DIAGNOSIS — M14812 Arthropathies in other specified diseases classified elsewhere, left shoulder: Secondary | ICD-10-CM | POA: Diagnosis not present

## 2021-02-17 DIAGNOSIS — M14811 Arthropathies in other specified diseases classified elsewhere, right shoulder: Secondary | ICD-10-CM | POA: Diagnosis not present

## 2021-02-19 DIAGNOSIS — M14812 Arthropathies in other specified diseases classified elsewhere, left shoulder: Secondary | ICD-10-CM | POA: Diagnosis not present

## 2021-02-19 DIAGNOSIS — F1020 Alcohol dependence, uncomplicated: Secondary | ICD-10-CM | POA: Diagnosis not present

## 2021-02-19 DIAGNOSIS — M14811 Arthropathies in other specified diseases classified elsewhere, right shoulder: Secondary | ICD-10-CM | POA: Diagnosis not present

## 2021-02-19 DIAGNOSIS — F329 Major depressive disorder, single episode, unspecified: Secondary | ICD-10-CM | POA: Diagnosis not present

## 2021-02-19 DIAGNOSIS — F419 Anxiety disorder, unspecified: Secondary | ICD-10-CM | POA: Diagnosis not present

## 2021-02-23 DIAGNOSIS — M14811 Arthropathies in other specified diseases classified elsewhere, right shoulder: Secondary | ICD-10-CM | POA: Diagnosis not present

## 2021-02-23 DIAGNOSIS — M14812 Arthropathies in other specified diseases classified elsewhere, left shoulder: Secondary | ICD-10-CM | POA: Diagnosis not present

## 2021-02-23 DIAGNOSIS — F329 Major depressive disorder, single episode, unspecified: Secondary | ICD-10-CM | POA: Diagnosis not present

## 2021-02-23 DIAGNOSIS — F419 Anxiety disorder, unspecified: Secondary | ICD-10-CM | POA: Diagnosis not present

## 2021-02-23 DIAGNOSIS — F1020 Alcohol dependence, uncomplicated: Secondary | ICD-10-CM | POA: Diagnosis not present

## 2021-02-24 DIAGNOSIS — F329 Major depressive disorder, single episode, unspecified: Secondary | ICD-10-CM | POA: Diagnosis not present

## 2021-02-24 DIAGNOSIS — F419 Anxiety disorder, unspecified: Secondary | ICD-10-CM | POA: Diagnosis not present

## 2021-02-24 DIAGNOSIS — M14811 Arthropathies in other specified diseases classified elsewhere, right shoulder: Secondary | ICD-10-CM | POA: Diagnosis not present

## 2021-02-24 DIAGNOSIS — M14812 Arthropathies in other specified diseases classified elsewhere, left shoulder: Secondary | ICD-10-CM | POA: Diagnosis not present

## 2021-02-24 DIAGNOSIS — F1020 Alcohol dependence, uncomplicated: Secondary | ICD-10-CM | POA: Diagnosis not present

## 2021-02-26 DIAGNOSIS — M14812 Arthropathies in other specified diseases classified elsewhere, left shoulder: Secondary | ICD-10-CM | POA: Diagnosis not present

## 2021-02-26 DIAGNOSIS — M14811 Arthropathies in other specified diseases classified elsewhere, right shoulder: Secondary | ICD-10-CM | POA: Diagnosis not present

## 2021-02-26 DIAGNOSIS — F329 Major depressive disorder, single episode, unspecified: Secondary | ICD-10-CM | POA: Diagnosis not present

## 2021-02-26 DIAGNOSIS — F419 Anxiety disorder, unspecified: Secondary | ICD-10-CM | POA: Diagnosis not present

## 2021-02-26 DIAGNOSIS — F1020 Alcohol dependence, uncomplicated: Secondary | ICD-10-CM | POA: Diagnosis not present

## 2021-03-02 DIAGNOSIS — F1020 Alcohol dependence, uncomplicated: Secondary | ICD-10-CM | POA: Diagnosis not present

## 2021-03-02 DIAGNOSIS — F419 Anxiety disorder, unspecified: Secondary | ICD-10-CM | POA: Diagnosis not present

## 2021-03-02 DIAGNOSIS — F329 Major depressive disorder, single episode, unspecified: Secondary | ICD-10-CM | POA: Diagnosis not present

## 2021-03-02 DIAGNOSIS — M14811 Arthropathies in other specified diseases classified elsewhere, right shoulder: Secondary | ICD-10-CM | POA: Diagnosis not present

## 2021-03-02 DIAGNOSIS — M14812 Arthropathies in other specified diseases classified elsewhere, left shoulder: Secondary | ICD-10-CM | POA: Diagnosis not present

## 2021-03-03 DIAGNOSIS — F419 Anxiety disorder, unspecified: Secondary | ICD-10-CM | POA: Diagnosis not present

## 2021-03-03 DIAGNOSIS — M14811 Arthropathies in other specified diseases classified elsewhere, right shoulder: Secondary | ICD-10-CM | POA: Diagnosis not present

## 2021-03-03 DIAGNOSIS — F329 Major depressive disorder, single episode, unspecified: Secondary | ICD-10-CM | POA: Diagnosis not present

## 2021-03-03 DIAGNOSIS — M14812 Arthropathies in other specified diseases classified elsewhere, left shoulder: Secondary | ICD-10-CM | POA: Diagnosis not present

## 2021-03-03 DIAGNOSIS — F1020 Alcohol dependence, uncomplicated: Secondary | ICD-10-CM | POA: Diagnosis not present

## 2021-03-05 DIAGNOSIS — F419 Anxiety disorder, unspecified: Secondary | ICD-10-CM | POA: Diagnosis not present

## 2021-03-05 DIAGNOSIS — M14812 Arthropathies in other specified diseases classified elsewhere, left shoulder: Secondary | ICD-10-CM | POA: Diagnosis not present

## 2021-03-05 DIAGNOSIS — M14811 Arthropathies in other specified diseases classified elsewhere, right shoulder: Secondary | ICD-10-CM | POA: Diagnosis not present

## 2021-03-05 DIAGNOSIS — F1020 Alcohol dependence, uncomplicated: Secondary | ICD-10-CM | POA: Diagnosis not present

## 2021-03-05 DIAGNOSIS — F329 Major depressive disorder, single episode, unspecified: Secondary | ICD-10-CM | POA: Diagnosis not present

## 2021-03-09 DIAGNOSIS — F1020 Alcohol dependence, uncomplicated: Secondary | ICD-10-CM | POA: Diagnosis not present

## 2021-03-09 DIAGNOSIS — M14811 Arthropathies in other specified diseases classified elsewhere, right shoulder: Secondary | ICD-10-CM | POA: Diagnosis not present

## 2021-03-09 DIAGNOSIS — M14812 Arthropathies in other specified diseases classified elsewhere, left shoulder: Secondary | ICD-10-CM | POA: Diagnosis not present

## 2021-03-09 DIAGNOSIS — F419 Anxiety disorder, unspecified: Secondary | ICD-10-CM | POA: Diagnosis not present

## 2021-03-09 DIAGNOSIS — F329 Major depressive disorder, single episode, unspecified: Secondary | ICD-10-CM | POA: Diagnosis not present

## 2021-03-10 DIAGNOSIS — F1020 Alcohol dependence, uncomplicated: Secondary | ICD-10-CM | POA: Diagnosis not present

## 2021-03-10 DIAGNOSIS — F329 Major depressive disorder, single episode, unspecified: Secondary | ICD-10-CM | POA: Diagnosis not present

## 2021-03-10 DIAGNOSIS — M14812 Arthropathies in other specified diseases classified elsewhere, left shoulder: Secondary | ICD-10-CM | POA: Diagnosis not present

## 2021-03-10 DIAGNOSIS — M14811 Arthropathies in other specified diseases classified elsewhere, right shoulder: Secondary | ICD-10-CM | POA: Diagnosis not present

## 2021-03-10 DIAGNOSIS — F419 Anxiety disorder, unspecified: Secondary | ICD-10-CM | POA: Diagnosis not present

## 2021-03-12 ENCOUNTER — Other Ambulatory Visit: Payer: Self-pay | Admitting: Physician Assistant

## 2021-03-12 DIAGNOSIS — K760 Fatty (change of) liver, not elsewhere classified: Secondary | ICD-10-CM | POA: Diagnosis not present

## 2021-03-12 DIAGNOSIS — M14811 Arthropathies in other specified diseases classified elsewhere, right shoulder: Secondary | ICD-10-CM | POA: Diagnosis not present

## 2021-03-12 DIAGNOSIS — D696 Thrombocytopenia, unspecified: Secondary | ICD-10-CM | POA: Diagnosis not present

## 2021-03-12 DIAGNOSIS — R945 Abnormal results of liver function studies: Secondary | ICD-10-CM | POA: Diagnosis not present

## 2021-03-12 DIAGNOSIS — F419 Anxiety disorder, unspecified: Secondary | ICD-10-CM | POA: Diagnosis not present

## 2021-03-12 DIAGNOSIS — R7989 Other specified abnormal findings of blood chemistry: Secondary | ICD-10-CM

## 2021-03-12 DIAGNOSIS — F1020 Alcohol dependence, uncomplicated: Secondary | ICD-10-CM | POA: Diagnosis not present

## 2021-03-12 DIAGNOSIS — F329 Major depressive disorder, single episode, unspecified: Secondary | ICD-10-CM | POA: Diagnosis not present

## 2021-03-12 DIAGNOSIS — F1010 Alcohol abuse, uncomplicated: Secondary | ICD-10-CM | POA: Diagnosis not present

## 2021-03-12 DIAGNOSIS — M14812 Arthropathies in other specified diseases classified elsewhere, left shoulder: Secondary | ICD-10-CM | POA: Diagnosis not present

## 2021-03-13 ENCOUNTER — Ambulatory Visit
Admission: RE | Admit: 2021-03-13 | Discharge: 2021-03-13 | Disposition: A | Discharge status: Home / Self Care | Payer: BC Managed Care – PPO | Source: Ambulatory Visit | Attending: Physician Assistant | Admitting: Physician Assistant

## 2021-03-13 DIAGNOSIS — R7989 Other specified abnormal findings of blood chemistry: Secondary | ICD-10-CM | POA: Diagnosis not present

## 2021-03-13 DIAGNOSIS — R945 Abnormal results of liver function studies: Secondary | ICD-10-CM | POA: Diagnosis not present

## 2021-03-16 DIAGNOSIS — F419 Anxiety disorder, unspecified: Secondary | ICD-10-CM | POA: Diagnosis not present

## 2021-03-16 DIAGNOSIS — F329 Major depressive disorder, single episode, unspecified: Secondary | ICD-10-CM | POA: Diagnosis not present

## 2021-03-16 DIAGNOSIS — M14811 Arthropathies in other specified diseases classified elsewhere, right shoulder: Secondary | ICD-10-CM | POA: Diagnosis not present

## 2021-03-16 DIAGNOSIS — M14812 Arthropathies in other specified diseases classified elsewhere, left shoulder: Secondary | ICD-10-CM | POA: Diagnosis not present

## 2021-03-16 DIAGNOSIS — F1020 Alcohol dependence, uncomplicated: Secondary | ICD-10-CM | POA: Diagnosis not present

## 2021-03-17 DIAGNOSIS — F1020 Alcohol dependence, uncomplicated: Secondary | ICD-10-CM | POA: Diagnosis not present

## 2021-03-17 DIAGNOSIS — F329 Major depressive disorder, single episode, unspecified: Secondary | ICD-10-CM | POA: Diagnosis not present

## 2021-03-17 DIAGNOSIS — M14812 Arthropathies in other specified diseases classified elsewhere, left shoulder: Secondary | ICD-10-CM | POA: Diagnosis not present

## 2021-03-17 DIAGNOSIS — F419 Anxiety disorder, unspecified: Secondary | ICD-10-CM | POA: Diagnosis not present

## 2021-03-17 DIAGNOSIS — M14811 Arthropathies in other specified diseases classified elsewhere, right shoulder: Secondary | ICD-10-CM | POA: Diagnosis not present

## 2021-03-19 DIAGNOSIS — F419 Anxiety disorder, unspecified: Secondary | ICD-10-CM | POA: Diagnosis not present

## 2021-03-19 DIAGNOSIS — F329 Major depressive disorder, single episode, unspecified: Secondary | ICD-10-CM | POA: Diagnosis not present

## 2021-03-19 DIAGNOSIS — M14812 Arthropathies in other specified diseases classified elsewhere, left shoulder: Secondary | ICD-10-CM | POA: Diagnosis not present

## 2021-03-19 DIAGNOSIS — M14811 Arthropathies in other specified diseases classified elsewhere, right shoulder: Secondary | ICD-10-CM | POA: Diagnosis not present

## 2021-03-19 DIAGNOSIS — F1020 Alcohol dependence, uncomplicated: Secondary | ICD-10-CM | POA: Diagnosis not present

## 2021-03-26 DIAGNOSIS — M14812 Arthropathies in other specified diseases classified elsewhere, left shoulder: Secondary | ICD-10-CM | POA: Diagnosis not present

## 2021-03-26 DIAGNOSIS — F329 Major depressive disorder, single episode, unspecified: Secondary | ICD-10-CM | POA: Diagnosis not present

## 2021-03-26 DIAGNOSIS — F1020 Alcohol dependence, uncomplicated: Secondary | ICD-10-CM | POA: Diagnosis not present

## 2021-03-26 DIAGNOSIS — F419 Anxiety disorder, unspecified: Secondary | ICD-10-CM | POA: Diagnosis not present

## 2021-03-26 DIAGNOSIS — M14811 Arthropathies in other specified diseases classified elsewhere, right shoulder: Secondary | ICD-10-CM | POA: Diagnosis not present

## 2021-03-30 DIAGNOSIS — F419 Anxiety disorder, unspecified: Secondary | ICD-10-CM | POA: Diagnosis not present

## 2021-03-30 DIAGNOSIS — M14812 Arthropathies in other specified diseases classified elsewhere, left shoulder: Secondary | ICD-10-CM | POA: Diagnosis not present

## 2021-03-30 DIAGNOSIS — F329 Major depressive disorder, single episode, unspecified: Secondary | ICD-10-CM | POA: Diagnosis not present

## 2021-03-30 DIAGNOSIS — F1020 Alcohol dependence, uncomplicated: Secondary | ICD-10-CM | POA: Diagnosis not present

## 2021-03-30 DIAGNOSIS — M14811 Arthropathies in other specified diseases classified elsewhere, right shoulder: Secondary | ICD-10-CM | POA: Diagnosis not present

## 2021-03-31 DIAGNOSIS — F419 Anxiety disorder, unspecified: Secondary | ICD-10-CM | POA: Diagnosis not present

## 2021-03-31 DIAGNOSIS — M14812 Arthropathies in other specified diseases classified elsewhere, left shoulder: Secondary | ICD-10-CM | POA: Diagnosis not present

## 2021-03-31 DIAGNOSIS — F1020 Alcohol dependence, uncomplicated: Secondary | ICD-10-CM | POA: Diagnosis not present

## 2021-03-31 DIAGNOSIS — F329 Major depressive disorder, single episode, unspecified: Secondary | ICD-10-CM | POA: Diagnosis not present

## 2021-03-31 DIAGNOSIS — M14811 Arthropathies in other specified diseases classified elsewhere, right shoulder: Secondary | ICD-10-CM | POA: Diagnosis not present

## 2021-04-02 DIAGNOSIS — F329 Major depressive disorder, single episode, unspecified: Secondary | ICD-10-CM | POA: Diagnosis not present

## 2021-04-02 DIAGNOSIS — F1020 Alcohol dependence, uncomplicated: Secondary | ICD-10-CM | POA: Diagnosis not present

## 2021-04-02 DIAGNOSIS — F419 Anxiety disorder, unspecified: Secondary | ICD-10-CM | POA: Diagnosis not present

## 2021-04-02 DIAGNOSIS — M14811 Arthropathies in other specified diseases classified elsewhere, right shoulder: Secondary | ICD-10-CM | POA: Diagnosis not present

## 2021-04-02 DIAGNOSIS — M14812 Arthropathies in other specified diseases classified elsewhere, left shoulder: Secondary | ICD-10-CM | POA: Diagnosis not present

## 2021-04-06 DIAGNOSIS — F1010 Alcohol abuse, uncomplicated: Secondary | ICD-10-CM | POA: Diagnosis not present

## 2021-04-06 DIAGNOSIS — F329 Major depressive disorder, single episode, unspecified: Secondary | ICD-10-CM | POA: Diagnosis not present

## 2021-04-06 DIAGNOSIS — D696 Thrombocytopenia, unspecified: Secondary | ICD-10-CM | POA: Diagnosis not present

## 2021-04-06 DIAGNOSIS — R7989 Other specified abnormal findings of blood chemistry: Secondary | ICD-10-CM | POA: Diagnosis not present

## 2021-04-06 DIAGNOSIS — F419 Anxiety disorder, unspecified: Secondary | ICD-10-CM | POA: Diagnosis not present

## 2021-04-06 DIAGNOSIS — Z01818 Encounter for other preprocedural examination: Secondary | ICD-10-CM | POA: Diagnosis not present

## 2021-04-06 DIAGNOSIS — F1020 Alcohol dependence, uncomplicated: Secondary | ICD-10-CM | POA: Diagnosis not present

## 2021-04-06 DIAGNOSIS — R945 Abnormal results of liver function studies: Secondary | ICD-10-CM | POA: Diagnosis not present

## 2021-04-06 DIAGNOSIS — M14812 Arthropathies in other specified diseases classified elsewhere, left shoulder: Secondary | ICD-10-CM | POA: Diagnosis not present

## 2021-04-06 DIAGNOSIS — M14811 Arthropathies in other specified diseases classified elsewhere, right shoulder: Secondary | ICD-10-CM | POA: Diagnosis not present

## 2021-04-07 DIAGNOSIS — M14812 Arthropathies in other specified diseases classified elsewhere, left shoulder: Secondary | ICD-10-CM | POA: Diagnosis not present

## 2021-04-07 DIAGNOSIS — M14811 Arthropathies in other specified diseases classified elsewhere, right shoulder: Secondary | ICD-10-CM | POA: Diagnosis not present

## 2021-04-07 DIAGNOSIS — F1020 Alcohol dependence, uncomplicated: Secondary | ICD-10-CM | POA: Diagnosis not present

## 2021-04-07 DIAGNOSIS — F329 Major depressive disorder, single episode, unspecified: Secondary | ICD-10-CM | POA: Diagnosis not present

## 2021-04-07 DIAGNOSIS — F419 Anxiety disorder, unspecified: Secondary | ICD-10-CM | POA: Diagnosis not present

## 2021-04-09 DIAGNOSIS — F1020 Alcohol dependence, uncomplicated: Secondary | ICD-10-CM | POA: Diagnosis not present

## 2021-04-09 DIAGNOSIS — F329 Major depressive disorder, single episode, unspecified: Secondary | ICD-10-CM | POA: Diagnosis not present

## 2021-04-09 DIAGNOSIS — M14811 Arthropathies in other specified diseases classified elsewhere, right shoulder: Secondary | ICD-10-CM | POA: Diagnosis not present

## 2021-04-09 DIAGNOSIS — M14812 Arthropathies in other specified diseases classified elsewhere, left shoulder: Secondary | ICD-10-CM | POA: Diagnosis not present

## 2021-04-09 DIAGNOSIS — F419 Anxiety disorder, unspecified: Secondary | ICD-10-CM | POA: Diagnosis not present

## 2021-09-22 DIAGNOSIS — M25511 Pain in right shoulder: Secondary | ICD-10-CM | POA: Diagnosis not present

## 2021-09-27 DIAGNOSIS — R609 Edema, unspecified: Secondary | ICD-10-CM | POA: Diagnosis not present

## 2021-09-30 ENCOUNTER — Other Ambulatory Visit: Payer: Self-pay

## 2021-09-30 ENCOUNTER — Encounter: Payer: Self-pay | Admitting: Emergency Medicine

## 2021-09-30 DIAGNOSIS — K7040 Alcoholic hepatic failure without coma: Secondary | ICD-10-CM | POA: Diagnosis present

## 2021-09-30 DIAGNOSIS — R188 Other ascites: Secondary | ICD-10-CM | POA: Diagnosis not present

## 2021-09-30 DIAGNOSIS — D638 Anemia in other chronic diseases classified elsewhere: Secondary | ICD-10-CM | POA: Diagnosis not present

## 2021-09-30 DIAGNOSIS — K767 Hepatorenal syndrome: Secondary | ICD-10-CM | POA: Diagnosis not present

## 2021-09-30 DIAGNOSIS — K5900 Constipation, unspecified: Secondary | ICD-10-CM | POA: Diagnosis not present

## 2021-09-30 DIAGNOSIS — K852 Alcohol induced acute pancreatitis without necrosis or infection: Secondary | ICD-10-CM | POA: Diagnosis present

## 2021-09-30 DIAGNOSIS — D689 Coagulation defect, unspecified: Secondary | ICD-10-CM | POA: Diagnosis not present

## 2021-09-30 DIAGNOSIS — Z87891 Personal history of nicotine dependence: Secondary | ICD-10-CM | POA: Diagnosis not present

## 2021-09-30 DIAGNOSIS — I10 Essential (primary) hypertension: Secondary | ICD-10-CM | POA: Diagnosis not present

## 2021-09-30 DIAGNOSIS — K7031 Alcoholic cirrhosis of liver with ascites: Principal | ICD-10-CM | POA: Diagnosis present

## 2021-09-30 DIAGNOSIS — R945 Abnormal results of liver function studies: Secondary | ICD-10-CM | POA: Diagnosis not present

## 2021-09-30 DIAGNOSIS — D649 Anemia, unspecified: Secondary | ICD-10-CM | POA: Diagnosis not present

## 2021-09-30 DIAGNOSIS — E440 Moderate protein-calorie malnutrition: Secondary | ICD-10-CM | POA: Diagnosis not present

## 2021-09-30 DIAGNOSIS — K2970 Gastritis, unspecified, without bleeding: Secondary | ICD-10-CM | POA: Diagnosis present

## 2021-09-30 DIAGNOSIS — N179 Acute kidney failure, unspecified: Secondary | ICD-10-CM | POA: Diagnosis present

## 2021-09-30 DIAGNOSIS — R140 Abdominal distension (gaseous): Secondary | ICD-10-CM | POA: Diagnosis not present

## 2021-09-30 DIAGNOSIS — E8770 Fluid overload, unspecified: Secondary | ICD-10-CM | POA: Diagnosis present

## 2021-09-30 DIAGNOSIS — F10288 Alcohol dependence with other alcohol-induced disorder: Secondary | ICD-10-CM | POA: Diagnosis not present

## 2021-09-30 DIAGNOSIS — K766 Portal hypertension: Secondary | ICD-10-CM | POA: Diagnosis present

## 2021-09-30 DIAGNOSIS — K709 Alcoholic liver disease, unspecified: Secondary | ICD-10-CM | POA: Diagnosis not present

## 2021-09-30 DIAGNOSIS — D696 Thrombocytopenia, unspecified: Secondary | ICD-10-CM | POA: Diagnosis not present

## 2021-09-30 DIAGNOSIS — D62 Acute posthemorrhagic anemia: Secondary | ICD-10-CM | POA: Diagnosis not present

## 2021-09-30 DIAGNOSIS — G40509 Epileptic seizures related to external causes, not intractable, without status epilepticus: Secondary | ICD-10-CM | POA: Diagnosis not present

## 2021-09-30 DIAGNOSIS — K7290 Hepatic failure, unspecified without coma: Secondary | ICD-10-CM | POA: Diagnosis not present

## 2021-09-30 DIAGNOSIS — E8809 Other disorders of plasma-protein metabolism, not elsewhere classified: Secondary | ICD-10-CM | POA: Diagnosis present

## 2021-09-30 DIAGNOSIS — R601 Generalized edema: Secondary | ICD-10-CM | POA: Diagnosis present

## 2021-09-30 DIAGNOSIS — K7460 Unspecified cirrhosis of liver: Secondary | ICD-10-CM | POA: Diagnosis not present

## 2021-09-30 DIAGNOSIS — Z79899 Other long term (current) drug therapy: Secondary | ICD-10-CM

## 2021-09-30 DIAGNOSIS — F1090 Alcohol use, unspecified, uncomplicated: Secondary | ICD-10-CM | POA: Diagnosis not present

## 2021-09-30 NOTE — ED Triage Notes (Signed)
Patient ambulatory to triage with steady gait, without difficulty or distress noted; pt reports abd/leg swelling x 2hrs with leg cramping; pt reports hx alcohol abuse and denies drinking heavily at present; denies any other accomp symptoms

## 2021-10-01 ENCOUNTER — Inpatient Hospital Stay: Payer: BC Managed Care – PPO

## 2021-10-01 ENCOUNTER — Encounter: Payer: Self-pay | Admitting: Internal Medicine

## 2021-10-01 ENCOUNTER — Inpatient Hospital Stay
Admission: EM | Admit: 2021-10-01 | Discharge: 2021-10-05 | DRG: 432 | Disposition: A | Discharge status: Home / Self Care | Payer: BC Managed Care – PPO | Attending: Obstetrics and Gynecology | Admitting: Obstetrics and Gynecology

## 2021-10-01 DIAGNOSIS — R601 Generalized edema: Secondary | ICD-10-CM | POA: Diagnosis present

## 2021-10-01 DIAGNOSIS — K5900 Constipation, unspecified: Secondary | ICD-10-CM

## 2021-10-01 DIAGNOSIS — D689 Coagulation defect, unspecified: Secondary | ICD-10-CM | POA: Diagnosis present

## 2021-10-01 DIAGNOSIS — Z87891 Personal history of nicotine dependence: Secondary | ICD-10-CM | POA: Diagnosis not present

## 2021-10-01 DIAGNOSIS — K709 Alcoholic liver disease, unspecified: Secondary | ICD-10-CM

## 2021-10-01 DIAGNOSIS — K7031 Alcoholic cirrhosis of liver with ascites: Secondary | ICD-10-CM | POA: Diagnosis present

## 2021-10-01 DIAGNOSIS — K852 Alcohol induced acute pancreatitis without necrosis or infection: Secondary | ICD-10-CM | POA: Diagnosis present

## 2021-10-01 DIAGNOSIS — Z87898 Personal history of other specified conditions: Secondary | ICD-10-CM

## 2021-10-01 DIAGNOSIS — D649 Anemia, unspecified: Secondary | ICD-10-CM

## 2021-10-01 DIAGNOSIS — Z8659 Personal history of other mental and behavioral disorders: Secondary | ICD-10-CM

## 2021-10-01 DIAGNOSIS — E8809 Other disorders of plasma-protein metabolism, not elsewhere classified: Secondary | ICD-10-CM

## 2021-10-01 DIAGNOSIS — K767 Hepatorenal syndrome: Secondary | ICD-10-CM | POA: Diagnosis present

## 2021-10-01 DIAGNOSIS — K766 Portal hypertension: Secondary | ICD-10-CM | POA: Diagnosis present

## 2021-10-01 DIAGNOSIS — N179 Acute kidney failure, unspecified: Secondary | ICD-10-CM

## 2021-10-01 DIAGNOSIS — K7290 Hepatic failure, unspecified without coma: Secondary | ICD-10-CM

## 2021-10-01 DIAGNOSIS — G40509 Epileptic seizures related to external causes, not intractable, without status epilepticus: Secondary | ICD-10-CM | POA: Diagnosis present

## 2021-10-01 DIAGNOSIS — Z79899 Other long term (current) drug therapy: Secondary | ICD-10-CM | POA: Diagnosis not present

## 2021-10-01 DIAGNOSIS — D696 Thrombocytopenia, unspecified: Secondary | ICD-10-CM | POA: Diagnosis present

## 2021-10-01 DIAGNOSIS — F1090 Alcohol use, unspecified, uncomplicated: Secondary | ICD-10-CM | POA: Diagnosis not present

## 2021-10-01 DIAGNOSIS — D638 Anemia in other chronic diseases classified elsewhere: Secondary | ICD-10-CM | POA: Diagnosis present

## 2021-10-01 DIAGNOSIS — D62 Acute posthemorrhagic anemia: Secondary | ICD-10-CM

## 2021-10-01 DIAGNOSIS — E8770 Fluid overload, unspecified: Secondary | ICD-10-CM | POA: Diagnosis present

## 2021-10-01 DIAGNOSIS — K7460 Unspecified cirrhosis of liver: Secondary | ICD-10-CM | POA: Diagnosis not present

## 2021-10-01 DIAGNOSIS — F10288 Alcohol dependence with other alcohol-induced disorder: Secondary | ICD-10-CM | POA: Diagnosis present

## 2021-10-01 DIAGNOSIS — K859 Acute pancreatitis without necrosis or infection, unspecified: Secondary | ICD-10-CM

## 2021-10-01 DIAGNOSIS — E440 Moderate protein-calorie malnutrition: Secondary | ICD-10-CM | POA: Insufficient documentation

## 2021-10-01 DIAGNOSIS — K2970 Gastritis, unspecified, without bleeding: Secondary | ICD-10-CM | POA: Diagnosis present

## 2021-10-01 DIAGNOSIS — K7040 Alcoholic hepatic failure without coma: Secondary | ICD-10-CM | POA: Diagnosis present

## 2021-10-01 LAB — BODY FLUID CELL COUNT WITH DIFFERENTIAL
Eos, Fluid: 0 %
Lymphs, Fluid: 53 %
Monocyte-Macrophage-Serous Fluid: 43 %
Neutrophil Count, Fluid: 4 %
Total Nucleated Cell Count, Fluid: 158 cu mm

## 2021-10-01 LAB — PROTIME-INR
INR: 2.5 — ABNORMAL HIGH (ref 0.8–1.2)
Prothrombin Time: 26.6 seconds — ABNORMAL HIGH (ref 11.4–15.2)

## 2021-10-01 LAB — URINALYSIS, ROUTINE W REFLEX MICROSCOPIC
Bilirubin Urine: NEGATIVE
Glucose, UA: NEGATIVE mg/dL
Hgb urine dipstick: NEGATIVE
Ketones, ur: NEGATIVE mg/dL
Leukocytes,Ua: NEGATIVE
Nitrite: NEGATIVE
Protein, ur: NEGATIVE mg/dL
Specific Gravity, Urine: 1.012 (ref 1.005–1.030)
pH: 5 (ref 5.0–8.0)

## 2021-10-01 LAB — PROTEIN, PLEURAL OR PERITONEAL FLUID: Total protein, fluid: <3 g/dL

## 2021-10-01 LAB — ALBUMIN, PLEURAL OR PERITONEAL FLUID: Albumin, Fluid: <1.5 g/dL

## 2021-10-01 LAB — COMPREHENSIVE METABOLIC PANEL
ALT: 52 U/L — ABNORMAL HIGH (ref 0–44)
AST: 150 U/L — ABNORMAL HIGH (ref 15–41)
Albumin: <1.5 g/dL — ABNORMAL LOW (ref 3.5–5.0)
Alkaline Phosphatase: 112 U/L (ref 38–126)
Anion gap: 6 (ref 5–15)
BUN: 28 mg/dL — ABNORMAL HIGH (ref 6–20)
CO2: 26 mmol/L (ref 22–32)
Calcium: 8 mg/dL — ABNORMAL LOW (ref 8.9–10.3)
Chloride: 100 mmol/L (ref 98–111)
Creatinine, Ser: 2.65 mg/dL — ABNORMAL HIGH (ref 0.61–1.24)
GFR, Estimated: 30 mL/min — ABNORMAL LOW (ref >60)
Glucose, Bld: 114 mg/dL — ABNORMAL HIGH (ref 70–99)
Potassium: 3.9 mmol/L (ref 3.5–5.1)
Sodium: 132 mmol/L — ABNORMAL LOW (ref 135–145)
Total Bilirubin: 6.8 mg/dL — ABNORMAL HIGH (ref 0.3–1.2)
Total Protein: 6.8 g/dL (ref 6.5–8.1)

## 2021-10-01 LAB — CBC WITH DIFFERENTIAL/PLATELET
Abs Immature Granulocytes: 0.03 10*3/uL (ref 0.00–0.07)
Basophils Absolute: 0.1 10*3/uL (ref 0.0–0.1)
Basophils Relative: 1 %
Eosinophils Absolute: 0.5 10*3/uL (ref 0.0–0.5)
Eosinophils Relative: 6 %
HCT: 23.9 % — ABNORMAL LOW (ref 39.0–52.0)
Hemoglobin: 8.3 g/dL — ABNORMAL LOW (ref 13.0–17.0)
Immature Granulocytes: 0 %
Lymphocytes Relative: 25 %
Lymphs Abs: 2.2 10*3/uL (ref 0.7–4.0)
MCH: 32.5 pg (ref 26.0–34.0)
MCHC: 34.7 g/dL (ref 30.0–36.0)
MCV: 93.7 fL (ref 80.0–100.0)
Monocytes Absolute: 0.9 10*3/uL (ref 0.1–1.0)
Monocytes Relative: 10 %
Neutro Abs: 5.3 10*3/uL (ref 1.7–7.7)
Neutrophils Relative %: 58 %
Platelets: 112 10*3/uL — ABNORMAL LOW (ref 150–400)
RBC: 2.55 MIL/uL — ABNORMAL LOW (ref 4.22–5.81)
RDW: 14.6 % (ref 11.5–15.5)
WBC: 9.1 10*3/uL (ref 4.0–10.5)
nRBC: 0 % (ref 0.0–0.2)

## 2021-10-01 LAB — AMMONIA: Ammonia: 164 umol/L — ABNORMAL HIGH (ref 9–35)

## 2021-10-01 LAB — HEPATITIS A ANTIBODY, IGM: Hep A IgM: NONREACTIVE

## 2021-10-01 LAB — TYPE AND SCREEN
ABO/RH(D): A POS
Antibody Screen: NEGATIVE

## 2021-10-01 LAB — ETHANOL: Alcohol, Ethyl (B): <10 mg/dL (ref <10)

## 2021-10-01 LAB — HIV ANTIBODY (ROUTINE TESTING W REFLEX): HIV Screen 4th Generation wRfx: NONREACTIVE

## 2021-10-01 LAB — HEPATITIS C ANTIBODY: HCV Ab: NONREACTIVE

## 2021-10-01 LAB — IRON AND TIBC: Iron: 87 ug/dL (ref 45–182)

## 2021-10-01 LAB — HEMOGLOBIN
Hemoglobin: 7.9 g/dL — ABNORMAL LOW (ref 13.0–17.0)
Hemoglobin: 8.7 g/dL — ABNORMAL LOW (ref 13.0–17.0)

## 2021-10-01 LAB — HEPATITIS B SURFACE ANTIGEN: Hepatitis B Surface Ag: NONREACTIVE

## 2021-10-01 LAB — LACTATE DEHYDROGENASE, PLEURAL OR PERITONEAL FLUID: LD, Fluid: 42 U/L — ABNORMAL HIGH (ref 3–23)

## 2021-10-01 LAB — LIPASE, BLOOD: Lipase: 102 U/L — ABNORMAL HIGH (ref 11–51)

## 2021-10-01 LAB — HEPATITIS B CORE ANTIBODY, TOTAL: Hep B Core Total Ab: NONREACTIVE

## 2021-10-01 MED ORDER — THIAMINE HCL 100 MG/ML IJ SOLN
100.0000 mg | Freq: Every day | INTRAMUSCULAR | Status: DC
  Administered 2021-10-03: 100 mg via INTRAVENOUS
  Filled 2021-10-01 (×2): qty 2

## 2021-10-01 MED ORDER — FUROSEMIDE 40 MG PO TABS: 40.0000 mg | ORAL_TABLET | Freq: Every day | ORAL | Status: DC

## 2021-10-01 MED ORDER — ALBUMIN HUMAN 25 % IV SOLN
25.0000 g | Freq: Every day | INTRAVENOUS | Status: DC
  Administered 2021-10-02: 25 g via INTRAVENOUS
  Filled 2021-10-01: qty 100

## 2021-10-01 MED ORDER — ACETAMINOPHEN 325 MG PO TABS: 650.0000 mg | ORAL_TABLET | Freq: Four times a day (QID) | ORAL | Status: DC | PRN

## 2021-10-01 MED ORDER — ONDANSETRON HCL 4 MG PO TABS: 4.0000 mg | ORAL_TABLET | Freq: Four times a day (QID) | ORAL | Status: DC | PRN

## 2021-10-01 MED ORDER — LACTULOSE 10 GM/15ML PO SOLN: 30.0000 g | Freq: Three times a day (TID) | ORAL | Status: DC

## 2021-10-01 MED ORDER — MORPHINE SULFATE (PF) 4 MG/ML IV SOLN
4.0000 mg | Freq: Once | INTRAVENOUS | Status: AC
  Administered 2021-10-01: 4 mg via INTRAVENOUS
  Filled 2021-10-01: qty 1

## 2021-10-01 MED ORDER — ACETAMINOPHEN 650 MG RE SUPP: 650.0000 mg | Freq: Four times a day (QID) | RECTAL | Status: DC | PRN

## 2021-10-01 MED ORDER — SPIRONOLACTONE 25 MG PO TABS: 100.0000 mg | ORAL_TABLET | Freq: Every day | ORAL | Status: DC

## 2021-10-01 MED ORDER — ONDANSETRON HCL 4 MG/2ML IJ SOLN: 4.0000 mg | Freq: Four times a day (QID) | INTRAMUSCULAR | Status: DC | PRN

## 2021-10-01 MED ORDER — THIAMINE MONONITRATE 100 MG PO TABS
100.0000 mg | ORAL_TABLET | Freq: Every day | ORAL | Status: DC
  Administered 2021-10-01 – 2021-10-05 (×5): 100 mg via ORAL
  Filled 2021-10-01 (×5): qty 1

## 2021-10-01 MED ORDER — VITAMIN K1 10 MG/ML IJ SOLN
10.0000 mg | Freq: Every day | INTRAMUSCULAR | Status: AC
  Administered 2021-10-02 – 2021-10-03 (×2): 10 mg via SUBCUTANEOUS
  Filled 2021-10-01 (×2): qty 1

## 2021-10-01 MED ORDER — LORAZEPAM 1 MG PO TABS
1.0000 mg | ORAL_TABLET | ORAL | Status: AC | PRN
  Administered 2021-10-02: 1 mg via ORAL
  Filled 2021-10-01: qty 1

## 2021-10-01 MED ORDER — VITAMIN K1 10 MG/ML IJ SOLN: 10.0000 mg | Freq: Every day | INTRAMUSCULAR | Status: DC

## 2021-10-01 MED ORDER — PANTOPRAZOLE SODIUM 40 MG IV SOLR
40.0000 mg | Freq: Two times a day (BID) | INTRAVENOUS | Status: DC
  Administered 2021-10-01 – 2021-10-04 (×7): 40 mg via INTRAVENOUS
  Filled 2021-10-01 (×7): qty 10

## 2021-10-01 MED ORDER — SODIUM CHLORIDE 0.9 % IV SOLN
1.0000 g | INTRAVENOUS | Status: DC
  Administered 2021-10-01: 1 g via INTRAVENOUS
  Filled 2021-10-01 (×2): qty 10

## 2021-10-01 MED ORDER — ADULT MULTIVITAMIN W/MINERALS CH
1.0000 | ORAL_TABLET | Freq: Every day | ORAL | Status: DC
  Administered 2021-10-01 – 2021-10-05 (×5): 1 via ORAL
  Filled 2021-10-01 (×5): qty 1

## 2021-10-01 MED ORDER — SPIRONOLACTONE 25 MG PO TABS: 50.0000 mg | ORAL_TABLET | Freq: Two times a day (BID) | ORAL | Status: DC

## 2021-10-01 MED ORDER — LORAZEPAM 2 MG/ML IJ SOLN: 1.0000 mg | INTRAMUSCULAR | Status: AC | PRN

## 2021-10-01 MED ORDER — ALBUMIN HUMAN 25 % IV SOLN
25.0000 g | Freq: Once | INTRAVENOUS | Status: AC
  Administered 2021-10-01: 25 g via INTRAVENOUS
  Filled 2021-10-01: qty 100

## 2021-10-01 MED ORDER — VITAMIN K1 10 MG/ML IJ SOLN
10.0000 mg | Freq: Once | INTRAVENOUS | Status: AC
  Administered 2021-10-01: 10 mg via INTRAVENOUS
  Filled 2021-10-01 (×2): qty 1

## 2021-10-01 MED ORDER — FUROSEMIDE 10 MG/ML IJ SOLN: 20.0000 mg | Freq: Two times a day (BID) | INTRAMUSCULAR | Status: DC

## 2021-10-01 MED ORDER — FOLIC ACID 1 MG PO TABS
1.0000 mg | ORAL_TABLET | Freq: Every day | ORAL | Status: DC
  Administered 2021-10-01 – 2021-10-05 (×5): 1 mg via ORAL
  Filled 2021-10-01 (×5): qty 1

## 2021-10-01 NOTE — Discharge Instructions (Signed)
Intensive Outpatient Programs   [facility] [address] [place],  [place], [place] [phone]  [facility] (Inpatient and outpatient)[phone] (Suboxone and Methadone) [address] [phone]  [facility]: [facility] Programs - Intensive Outpatient [address] [place], [place] [address], [place]  [phone]  [facility] (Outpatient, Inpatient, [facility] (Groups and Residental) (insurance only) [phone] [place], [place] [phone]   [facility] (for caregivers and family) [address] Laurell Josephs [address] [place], [place] [phone]  Residential Treatment Programs  [facility](2 years) Residential: 18 days)[facility] [address] [place], [place], [place] [phone] or [phone]  [facility] [address] [place], [place], [place] [phone]  [facility] [address] [place], [place], [place] [phone] Admissions: 8am-3pm M-F  [facility]: Residential Program [phone] Days)             [facility]: [facility]  [place], [place], [place]  [phone] or [phone] in Hours over the weekend or by referral)   Mobil Crisis: [facility]:[phone] (for crisis response 24 hours a day)      Low-Sodium Nutrition Therapy Eating less sodium can help you if you have high blood pressure, heart failure, or kidney or liver disease. Your body needs a  little sodium, but too much sodium can cause your body to hold onto extra water.  This extra water will raise your blood pressure and can cause damage to your heart, kidneys, or liver as they are forced to work harder. Sometimes you can see how the extra fluid affects you because your hands, legs, or belly swell.  You may also hold water around your heart and lungs, which makes it hard to breathe. Even if you take medication for blood pressure or a water pill (diuretic) to remove fluid, it is still important to have less salt in your diet. Check with your primary care provider before drinking alcohol since it may affect the amount of fluid in your body and how your heart, kidneys, or liver work.  Sodium in Food A low-sodium meal plan limits the sodium that you get from food and beverages to 1,500-2,000 milligrams (mg) per day. Salt is the main source of sodium.  Read the nutrition label on the package to find out how much sodium is in one serving of a food. Select foods with 140 milligrams (mg) of sodium or less per serving. You may be able to eat one or two servings of foods with a little more than 140 milligrams (mg) of sodium if you are closely watching how much sodium you eat in a day. Check the serving size on the label. The amount of sodium listed on the label shows the amount in one serving of the food. So, if you eat more than one serving, you will get more sodium than the amount listed.  Cutting Back on Sodium Eat more fresh foods. Fresh fruits and vegetables are low in sodium, as well as frozen vegetables and fruits that have no added juices or sauces. Fresh meats are lower in sodium than processed meats, such as bacon, sausage, and hotdogs. Not all processed foods are unhealthy, but some processed foods may have too much sodium.  Eat less salt at the table and when cooking.  One of the ingredients in salt is sodium. One teaspoon of table salt has 2,300 milligrams of sodium. Leave  the salt out of recipes for pasta, casseroles, and soups. Be a Engineer, building services. Food packages that say "Salt-free", sodium-free", "very low sodium," and "low sodium" have less than 140 milligrams of sodium per serving. Beware of products identified as "Unsalted," "No Salt Added," "Reduced Sodium," or "Lower Sodium."  These items may still be high in sodium. You should always check the nutrition label. Add flavors to your food without adding sodium. Try lemon juice, lime juice, or vinegar. Dry or fresh herbs add flavor. Buy a sodium-free seasoning blend or make your own at home. You can purchase salt-free or sodium-free condiments like barbeque sauce in stores and online.   Eating in Restaurants Choose foods carefully when you eat outside your home. Restaurant foods can be very high in sodium.  Many restaurants provide nutrition facts on their menus or their websites. If you cannot find that information, ask your server.  Let your server know that you want your food to be cooked without salt and that you would like your salad dressing and sauces to be served on the side.  Foods Recommended Grains Bread and rolls without salted tops Homemade bread made with reduced-sodium baking powder Cold cereals, especially shredded wheat and puffed rice Oats, grits, or cream of wheat Pastas, quinoa, and rice Popcorn, pretzels or crackers without salt Corn tortillas Protein Foods Fresh meats and fish (check the nutrition labels - make sure they are not packaged in a sodium solution) Canned or packed tuna (no more than 4 ounces at 1 serving) Beans and peas Soybeans and tofu Eggs Nuts or nut butters without salt Dairy Milk or milk powder Plant milks, such as rice and soy Yogurt, including Greek yogurt Small amounts of natural cheese (blocks of cheese) or reduced-sodium cheese can be used in moderation.  (Swiss, ricotta, and fresh mozzarella cheese are lower in sodium than the others) Cream  Cheese Low sodium cottage cheese Vegetables Fresh and frozen vegetables without added sauces or salt Homemade soups (without salt) Low-sodium, salt-free or sodium-free canned vegetables and soups Fruit Fresh and canned fruits Dried fruits, such as raisins, cranberries, and prunes Oils Tub or liquid margarine, regular or without salt Canola, corn, peanut, olive, safflower, or sunflower oils Condiments Fresh or dried herbs such as basil, bay leaf, dill, mustard (dry), nutmeg, paprika, parsley, rosemary, sage, or thyme. Low sodium ketchup Vinegar Lemon or lime juice Pepper, red pepper flakes, and cayenne. Hot sauce contains sodium, but if you use just a drop or two, it will not add up to much. Salt-free or sodium-free seasoning mixes and marinades Simple salad dressings: vinegar and oil  Foods Not Recommended Grains Breads or crackers topped with salt Cereals (hot/cold) with more than 300 mg sodium per serving Biscuits, cornbread, and other "quick" breads prepared with baking soda Pre-packaged bread crumbs Seasoned and packaged rice and pasta mixes Self-rising flours Protein Foods Cured meats: Bacon, ham, sausage, pepperoni and hot dogs Canned meats (chili, vienna sausage, or sardines) Smoked fish and meats Frozen meals that have more than 600 mg of sodium per serving Egg substitute (with added sodium) Dairy Buttermilk Processed cheese spreads Cottage cheese (1 cup may have over 500 mg of sodium; look for low-sodium.) American or feta cheese Shredded cheese has more sodium than blocks of cheese String cheese Vegetables Canned vegetables (unless they are salt-free, sodium-free  or low sodium) Frozen vegetables with seasoning and sauces Sauerkraut and pickled vegetables Canned or dried soups (unless they are salt-free, sodium-free, or low sodium) [place] fries and onion rings Fruit  Dried fruits preserved with additives that have sodium Oils  Salted butter or margarine,  all types of olives Condiments Salt, sea salt, kosher salt, onion salt, and garlic salt Seasoning mixes with salt Bouillon cubes Ketchup Barbeque sauce and Worcestershire sauce unless low sodium Soy sauce Salsa, pickles, olives, relish Salad dressings: ranch, blue cheese, [place], and [place].

## 2021-10-01 NOTE — Assessment & Plan Note (Signed)
Elevation of lipase to 102 and elevated bilirubin Suspect related to suspected alcoholic cirrhosis Follow-up ultrasound to evaluate for possible biliary obstruction

## 2021-10-01 NOTE — Assessment & Plan Note (Addendum)
Hemoglobin of 8 down from 13.7 about 8 months prior Denies blood in stool, melena or hematemesis times several months Given suspicion for cirrhosis, some concern for varices We will consult GI for possible scope Serial H&H Patient was typed and crossed from the ED

## 2021-10-01 NOTE — Consult Note (Addendum)
Wyline Mood , MD [address], [place], [place], [address] [address], [place], [place], [address] Phone: [phone]  Fax: [phone]  Consultation  Referring Provider:   Dr. Para March Primary Care Physician:  Farris Has, MD Primary Gastroenterologist: None         Reason for Consultation:     GI bleed  Date of Admission:  10/01/2021 Date of Consultation:  10/01/2021         HPI:   Andrew Cummings is a 41 y.o. male with history of alcohol abuse , presented to ER with worsening lower extremity edema poor appetite and weakness.  Found to be in AKI, monitor elevated LFTs low albumin I was asked to see the patient for anemia.  No mention of hematemesis or melena in the admission note.  He denies any hematemesis or melena stool has not been black in color in April of this year he had an episode of some blood in his stool as he describes the stool was black in color but none since he has taken an occasional ibuprofen for his shoulder pain.  He has in fact been drinking a lot since the age of 33 on a regular basis but cut down for a while and then during COVID pandemic started to drink much more previously his ring articulate but since the pandemic drinking champagne and wine half a bottle to 1 bottle a day.  He is aware that this is probably the reason why he is sick.  Denies any blood thinners.  No over-the-counter medications no history of illegal drug use tattoos medical service.  No other complaints presently the main reason he says he came to the hospital with swelling of the legs and fluid accumulation in his abdomen  On admission hemoglobin 8.3 g down from 13.78 months back.  INR 2.5.  Urine analysis no evidence of infection creatinine 2.65 was normal 9 months back albumin less than 1.5 total bilirubin 6.8 No urine drug screen available ultrasound ascites shows moderate volume abdominal ascites right upper quadrant ultrasound shows cirrhosis and gallbladder  wall Past Medical History:  Diagnosis Date   Alcohol abuse    History of closed shoulder dislocation    bilateral    History reviewed. No pertinent surgical history.  Prior to Admission medications   Medication Sig Start Date End Date Taking? Authorizing Provider  bumetanide (BUMEX) 1 MG tablet Take 1 mg by mouth daily. 09/27/21  Yes [provider]  FLUoxetine (PROZAC) 10 MG capsule Take 10 mg by mouth daily. Patient not taking: Reported on 10/01/2021    [provider]  folic acid (FOLVITE) 1 MG tablet Take 1 tablet (1 mg total) by mouth daily. Patient not taking: Reported on 10/01/2021 07/02/20   Alford Highland, MD  Multiple Vitamin (MULTIVITAMIN WITH MINERALS) TABS tablet Take 1 tablet by mouth daily. Patient not taking: Reported on 10/01/2021 07/02/20   Alford Highland, MD  traZODone (DESYREL) 50 MG tablet Take 1 tablet (50 mg total) by mouth at bedtime. Patient not taking: Reported on 10/01/2021 07/01/20   Alford Highland, MD    No family history on file.   Social History   Tobacco Use   Smoking status: Former    Types: Cigarettes   Smokeless tobacco: Never  Vaping Use   Vaping Use: Never used  Substance Use Topics   Alcohol use: Yes    Comment: excessive drinking on weekends, 1 drink a day on week days   Drug  use: Not Currently    Allergies as of 09/30/2021   (No Known Allergies)    Review of Systems:    All systems reviewed and negative except where noted in HPI.   Physical Exam:  Vital signs in last 24 hours: Temp:  [97.7 F (36.5 C)-97.9 F (36.6 C)] 97.8 F (36.6 C) (09/14 0751) Pulse Rate:  [75-97] 75 (09/14 0752) Resp:  [12-28] 12 (09/14 0751) BP: (108-119)/(62-73) 119/73 (09/14 0752) SpO2:  [97 %-100 %] 100 % (09/14 0751) Weight:  [91.2 kg] 91.2 kg (09/13 2357)   General:   Pleasant, cooperative in NAD Head:  Normocephalic and atraumatic. Eyes:   No icterus.   Conjunctiva pink. PERRLA. Ears:  Normal auditory acuity. Neck:   Supple; no masses or thyroidomegaly Lungs: Respirations even and unlabored. Lungs clear to auscultation bilaterally.   No wheezes, crackles, or rhonchi.  Heart:  Regular rate and rhythm;  Without murmur, clicks, rubs or gallops Abdomen:  Soft, distended, free fluid present nontender. Normal bowel sounds. No appreciable masses or hepatomegaly.  No rebound or guarding.  Neurologic:  Alert and oriented x3;  grossly normal neurologically. Psych:  Alert and cooperative. Normal affect.  LAB RESULTS: Recent Labs    10/01/21 0003 10/01/21 0541  WBC 9.1  --   HGB 8.3* 7.9*  HCT 23.9*  --   PLT 112*  --    BMET Recent Labs    10/01/21 0003  NA 132*  K 3.9  CL 100  CO2 26  GLUCOSE 114*  BUN 28*  CREATININE 2.65*  CALCIUM 8.0*   LFT Recent Labs    10/01/21 0003  PROT 6.8  ALBUMIN <1.5*  AST 150*  ALT 52*  ALKPHOS 112  BILITOT 6.8*   PT/INR Recent Labs    10/01/21 0234  LABPROT 26.6*  INR 2.5*    STUDIES: [place] ASCITES (ABDOMEN LIMITED)  Result Date: 10/01/2021 CLINICAL DATA:  Abdominal distension. EXAM: LIMITED ABDOMEN ULTRASOUND FOR ASCITES TECHNIQUE: Limited ultrasound survey for ascites was performed in all four abdominal quadrants. COMPARISON:  None Available. FINDINGS: Moderate volume 4 quadrant abdominal ascites. IMPRESSION: Moderate volume abdominal ascites. Electronically Signed   By: Rudie Meyer M.D.   On: 10/01/2021 08:10   US Abdomen Limited RUQ (LIVER/GB)  Result Date: 10/01/2021 CLINICAL DATA:  Elevated liver function studies. EXAM: ULTRASOUND ABDOMEN LIMITED RIGHT UPPER QUADRANT COMPARISON:  Ultrasound 03/13/2021.  MRI success 14 2022 FINDINGS: Gallbladder: Markedly thickened gallbladder wall likely due to the surrounding ascites. No gallstones. No sonographic Murphy sign. Common bile duct: Diameter: 2.0 mm Liver: Cirrhotic changes involving the liver with a very irregular liver contour, dilated hepatic fissures and moderate volume ascites. The portal vein is  patent but demonstrates reversed flow consistent with portal venous hypertension. Other: Ascites. IMPRESSION: 1. Cirrhotic changes involving the liver with reversed flow in the portal vein. No worrisome hepatic lesions or intrahepatic biliary dilatation. Moderate volume abdominal ascites. 2. Markedly thickened gallbladder wall likely related to the surrounding ascites and probable low albumin. No gallstones. Electronically Signed   By: Rudie Meyer M.D.   On: 10/01/2021 08:09      Impression / Plan:   Andrew Cummings is a 41 y.o. y/o male with history of alcohol abuse presented to the emergency room with edema weakness found to be in AKI possibly acute liver failure and anemia.  No overt blood loss noted I been consulted for anemia.  He has decompensated alcoholic liver cirrhosis with ascites complicated by acute kidney injury.  Plan 1.  Elevated INR could be from acute liver failure: Vitamin K 10 mg subcutaneous once daily for 3 days monitor INR daily.  No role in checking ammonia hepatic encephalopathy with medical diagnosis rule out other causes for confusion. 2.  Acute kidney injury suggest daily albumin 25 g to rule out prerenal causes obtain renal ultrasound to rule out obstruction if creatinine does not improve with improved volume through albumin and need to suspect hepatorenal syndrome which is a diagnosis of exclusion.  Any nephrotoxic drugs.  I have taken the pleasure to stop the patient's Lasix and Aldactone in view of AKI. 3.  Obtain urine drug screen 4.  IV octreotide antibiotics prophylaxis for GI bleeding in the setting of cirrhosis 5.  Once volume status is improved will consider endoscopy for anemia.  No rush as he has no active bleeding noted recently. 6.  He will need diagnostic abdominal paracentesis, can take out as much ascites as required.  Please administer 50 g of albumin after procedure once all the ascites has been taken out.  Please send ascites for evaluation as  spontaneous bacterial peritonitis may be a precipitating cause for acute renal failure and hepatorenal syndrome.  One did not have any abdominal symptoms to present with SBP 7.  Possible acute liver failure.  Pain: Acute viral hepatitis screen monitor INR on a daily basis. 8.  Ultrasound Doppler of the liver to rule out portal vein thrombosis 9.  I have strictly told him that he must stop drinking all alcohol otherwise he has a high risk of mortality  Thank you for involving me in the care of this patient.      LOS: 0 days   Wyline Mood, MD  10/01/2021, 9:15 AM

## 2021-10-01 NOTE — Assessment & Plan Note (Signed)
History of alcohol withdrawal seizure CIWA withdrawal protocol

## 2021-10-01 NOTE — Progress Notes (Signed)
Mobility Specialist - Progress Note   10/01/21 1600  Mobility  Activity Ambulated independently in hallway  Level of Assistance Independent  Assistive Device None  Distance Ambulated (ft) 360 ft  Activity Response Tolerated well  $Mobility charge 1 Mobility   Zetta Bills Mobility Specialist 10/01/21 4:57 PM

## 2021-10-01 NOTE — Assessment & Plan Note (Addendum)
Possibly nutritional / cirrhosis Received albumin in the ED Follow-up ultrasound ordered on admission Consider paracentesis Lasix and spironolactone when eating

## 2021-10-01 NOTE — H&P (Addendum)
History and Physical    Patient: Andrew Cummings NWG:[id number] DOB: 04/06/1980 DOA: 10/01/2021 DOS: the patient was seen and examined on 10/01/2021 PCP: Farris Has, MD  Patient coming from: Home  Chief Complaint:  Chief Complaint  Patient presents with   Leg Swelling    HPI: Andrew Cummings is a 41 y.o. male with medical history significant for Alcohol use disorder with complications of alcohol withdrawal seizures, alcoholic ketoacidosis and gastritis, thrombocytopenia secondary to alcohol use who presents to the ED with progressively worsening lower extremity edema and alcohol bloating, poor appetite and weakness.  He denies shortness of breath or leg pain or chest pain.  He denies abdominal pain ED course and data review: Vitals within normal limits.  Lab work with several abnormalities consistent with liver disease and also with renal disease including creatinine of 2.65, up from 0.55 about 8 months prior.  AST/ALT 156/52 with total bilirubin 6.8.  Albumin less than 1.5.  Sodium 132.  CBC notable for hemoglobin of 8.3, down from 13.7 about 8 months prior and platelets 112,000 up from 45,000 about the same time ago.  Lipase elevated at 102 and ammonia 164. Imaging, not done from the ED Patient started on IV albumin and hospitalist consulted for admission     Past Medical History:  Diagnosis Date   Alcohol abuse    History of closed shoulder dislocation    bilateral   History reviewed. No pertinent surgical history. Social History:  reports that he has quit smoking. His smoking use included cigarettes. He has never used smokeless tobacco. He reports current alcohol use. He reports that he does not currently use drugs.  No Known Allergies  No family history on file.  Prior to Admission medications   Medication Sig Start Date End Date Taking? Authorizing Provider  bumetanide (BUMEX) 1 MG tablet Take 1 mg by mouth daily. 09/27/21  Yes [provider]  FLUoxetine  (PROZAC) 10 MG capsule Take 10 mg by mouth daily. Patient not taking: Reported on 10/01/2021    [provider]  folic acid (FOLVITE) 1 MG tablet Take 1 tablet (1 mg total) by mouth daily. Patient not taking: Reported on 10/01/2021 07/02/20   Alford Highland, MD  Multiple Vitamin (MULTIVITAMIN WITH MINERALS) TABS tablet Take 1 tablet by mouth daily. Patient not taking: Reported on 10/01/2021 07/02/20   Alford Highland, MD  traZODone (DESYREL) 50 MG tablet Take 1 tablet (50 mg total) by mouth at bedtime. Patient not taking: Reported on 10/01/2021 07/01/20   Alford Highland, MD    Physical Exam: Vitals:   09/30/21 2356 09/30/21 2357 10/01/21 0300 10/01/21 0330  BP: 119/62  110/65 108/65  Pulse: 97  83 82  Resp: 17  16 16   Temp: 97.9 F (36.6 C)     TempSrc: Oral     SpO2: 97%  100% 100%  Weight:  91.2 kg    Height:  5\' 11"  (1.803 m)     Physical Exam Vitals and nursing note reviewed.  Constitutional:      General: He is not in acute distress. HENT:     Head: Normocephalic and atraumatic.  Cardiovascular:     Rate and Rhythm: Normal rate and regular rhythm.     Heart sounds: Normal heart sounds.  Pulmonary:     Effort: Pulmonary effort is normal.     Breath sounds: Normal breath sounds.  Abdominal:     General: There is distension.     Palpations: Abdomen is soft. There is fluid  wave.     Tenderness: There is no abdominal tenderness.  Musculoskeletal:     Right lower leg: Edema present.     Left lower leg: Edema present.  Neurological:     Mental Status: Mental status is at baseline.     Labs on Admission: I have personally reviewed following labs and imaging studies  CBC: Recent Labs  Lab 10/01/21 0003  WBC 9.1  NEUTROABS 5.3  HGB 8.3*  HCT 23.9*  MCV 93.7  PLT 112*   Basic Metabolic Panel: Recent Labs  Lab 10/01/21 0003  NA 132*  K 3.9  CL 100  CO2 26  GLUCOSE 114*  BUN 28*  CREATININE 2.65*  CALCIUM 8.0*   GFR: Estimated Creatinine  Clearance: 43.2 mL/min (A) (by C-G formula based on SCr of 2.65 mg/dL (H)). Liver Function Tests: Recent Labs  Lab 10/01/21 0003  AST 150*  ALT 52*  ALKPHOS 112  BILITOT 6.8*  PROT 6.8  ALBUMIN <1.5*   Recent Labs  Lab 10/01/21 0003  LIPASE 102*   Recent Labs  Lab 10/01/21 0003  AMMONIA 164*   Coagulation Profile: Recent Labs  Lab 10/01/21 0234  INR 2.5*   Cardiac Enzymes: No results for input(s): "CKTOTAL", "CKMB", "CKMBINDEX", "TROPONINI" in the last 168 hours. BNP (last 3 results) No results for input(s): "PROBNP" in the last 8760 hours. HbA1C: No results for input(s): "HGBA1C" in the last 72 hours. CBG: No results for input(s): "GLUCAP" in the last 168 hours. Lipid Profile: No results for input(s): "CHOL", "HDL", "LDLCALC", "TRIG", "CHOLHDL", "LDLDIRECT" in the last 72 hours. Thyroid Function Tests: No results for input(s): "TSH", "T4TOTAL", "FREET4", "T3FREE", "THYROIDAB" in the last 72 hours. Anemia Panel: No results for input(s): "VITAMINB12", "FOLATE", "FERRITIN", "TIBC", "IRON", "RETICCTPCT" in the last 72 hours. Urine analysis:    Component Value Date/Time   COLORURINE AMBER (A) 10/01/2021 0003   APPEARANCEUR HAZY (A) 10/01/2021 0003   LABSPEC 1.012 10/01/2021 0003   PHURINE 5.0 10/01/2021 0003   GLUCOSEU NEGATIVE 10/01/2021 0003   HGBUR NEGATIVE 10/01/2021 0003   BILIRUBINUR NEGATIVE 10/01/2021 0003   KETONESUR NEGATIVE 10/01/2021 0003   PROTEINUR NEGATIVE 10/01/2021 0003   NITRITE NEGATIVE 10/01/2021 0003   LEUKOCYTESUR NEGATIVE 10/01/2021 0003    Radiological Exams on Admission: No results found.   Data Reviewed: Relevant notes from primary care and specialist visits, past discharge summaries as available in EHR, including Care Everywhere. Prior diagnostic testing as pertinent to current admission diagnoses Updated medications and problem lists for reconciliation ED course, including vitals, labs, imaging, treatment and response to  treatment Triage notes, nursing and pharmacy notes and ED provider's notes Notable results as noted in HPI   Assessment and Plan: * Alcoholic liver disease (HCC) Suspect decompensated cirrhosis Hepatorenal syndrome Patient with thrombocytopenia, anemia, hyperammonemia, elevated transaminases and hyperbilirubinemia Patient received albumin in the ED GI consult for guidance We will get right upper quadrant ultrasound Hold lactulose, Lasix and spironolactone for now while n.p.o. for possible scope Monitor renal function  AKI (acute kidney injury) (HCC) Suspect secondary to liver disease. Avoid nephrotoxins and continue to monitor.  Anemia Hemoglobin of 8 down from 13.7 about 8 months prior Denies blood in stool, melena or hematemesis times several months Given suspicion for cirrhosis, some concern for varices We will consult GI for possible scope Serial H&H Patient was typed and crossed from the ED  Alcohol use disorder History of alcohol withdrawal seizure CIWA withdrawal protocol  Pancreatitis Elevation of lipase to 102 and elevated  bilirubin Suspect related to suspected alcoholic cirrhosis Follow-up ultrasound to evaluate for possible biliary obstruction  Anasarca Possibly nutritional / cirrhosis Received albumin in the ED Follow-up ultrasound ordered on admission Consider paracentesis Lasix and spironolactone when eating        DVT prophylaxis: SCD  Consults: [facility]  Advance Care Planning:   Code Status: Prior   Family Communication: none  Disposition Plan: Back to previous home environment  Severity of Illness: The appropriate patient status for this patient is INPATIENT. Inpatient status is judged to be reasonable and necessary in order to provide the required intensity of service to ensure the patient's safety. The patient's presenting symptoms, physical exam findings, and initial radiographic and laboratory data in the context of their chronic  comorbidities is felt to place them at high risk for further clinical deterioration. Furthermore, it is not anticipated that the patient will be medically stable for discharge from the hospital within 2 midnights of admission.   * I certify that at the point of admission it is my clinical judgment that the patient will require inpatient hospital care spanning beyond 2 midnights from the point of admission due to high intensity of service, high risk for further deterioration and high frequency of surveillance required.*  Author: Athena Masse, MD 10/01/2021 4:14 AM  For on call review [email or web address].

## 2021-10-01 NOTE — Procedures (Signed)
PROCEDURE SUMMARY:  Successful US guided paracentesis from RUQ. Yielded 1.5 L of clear yellow fluid.  No immediate complications.  Pt tolerated well.   Specimen was sent for labs.  EBL < 93mL  [name] 10/01/2021 4:38 PM

## 2021-10-01 NOTE — Assessment & Plan Note (Signed)
Suspect secondary to liver disease. Avoid nephrotoxins and continue to monitor.

## 2021-10-01 NOTE — TOC Initial Note (Signed)
Transition of Care [facility]) - Initial/Assessment Note    Patient Details  Name: Andrew Cummings MRN: [id number] Date of Birth: 07-Feb-1980  Transition of Care [facility] CM/SW Contact:    Chapman Fitch, RN Phone Number: 10/01/2021, 10:19 AM  Clinical Narrative:                     Transition of Care (TOC) Screening Note   Patient Details  Name: Andrew Cummings Date of Birth: [birth year]   Transition of Care [facility]) CM/SW Contact:    Chapman Fitch, RN Phone Number: 10/01/2021, 10:19 AM    Transition of Care Department [facility]) has reviewed patient and no TOC needs have been identified at this time. We will continue to monitor patient advancement through interdisciplinary progression rounds. If new patient transition needs arise, please place a TOC consult.  Substance abuse resources added to AVS for discharge      Patient Goals and CMS Choice        Expected Discharge Plan and Services     Discharge Planning Services: Other - See comment (SA resources)                                          Prior Living Arrangements/Services                       Activities of Daily Living      Permission Sought/Granted                  Emotional Assessment              Admission diagnosis:  Anasarca [R60.1] Normocytic anemia [D64.9] AKI (acute kidney injury) (HCC) [N17.9] Edema due to hypoalbuminemia [E88.09] Patient Active Problem List   Diagnosis Date Noted   Anasarca 10/01/2021   Alcoholic liver disease (HCC) 10/01/2021   Hepatorenal syndrome (HCC) 10/01/2021   Pancreatitis 10/01/2021   Alcohol use disorder 10/01/2021   History of seizure due to alcohol withdrawal 10/01/2021   Anemia 10/01/2021   Abnormal LFTs (liver function tests)    Laceration of tongue    Thrombocytopenia (HCC)    Alcohol withdrawal seizure (HCC) 06/29/2020   Alcoholic ketoacidosis 06/29/2020   Dislocation of right shoulder joint 06/29/2020    Transaminitis    Hypomagnesemia    Hypokalemia    PCP:  Farris Has, MD Pharmacy:   [facility] [phone] - [place], [place] - [address] [place] [address] Phone: [phone] Fax: [phone]     Social Determinants of Health (SDOH) Interventions    Readmission Risk Interventions     No data to display

## 2021-10-01 NOTE — Progress Notes (Addendum)
PROGRESS NOTE    Andrew Cummings  TIR:[id number] DOB: 09-29-1980 DOA: 10/01/2021 PCP: Farris Has, MD  Outpatient Specialists: none    Brief Narrative:   From admission h and p Andrew Cummings is a 41 y.o. male with medical history significant for Alcohol use disorder with complications of alcohol withdrawal seizures, alcoholic ketoacidosis and gastritis, thrombocytopenia secondary to alcohol use who presents to the ED with progressively worsening lower extremity edema and alcohol bloating, poor appetite and weakness.  He denies shortness of breath or leg pain or chest pain.  He denies abdominal pain ED course and data review: Vitals within normal limits.  Lab work with several abnormalities consistent with liver disease and also with renal disease including creatinine of 2.65, up from 0.55 about 8 months prior.  AST/ALT 156/52 with total bilirubin 6.8.  Albumin less than 1.5.  Sodium 132.  CBC notable for hemoglobin of 8.3, down from 13.7 about 8 months prior and platelets 112,000 up from 45,000 about the same time ago.  Lipase elevated at 102 and ammonia 164. Imaging, not done from the ED   Assessment & Plan:   Principal Problem:   Alcoholic liver disease (HCC) Active Problems:   Thrombocytopenia (HCC)   Anasarca   Hepatorenal syndrome (HCC)   Pancreatitis   Alcohol use disorder   History of seizure due to alcohol withdrawal   Anemia  # Alcohol liver disease Hasn't had variceal screen. Does not appear encephalopathic - TOC consult - f/u hiv, rpr, hcv, hbv - f/u ruq u/s  # Anasarca 2/2 decompensated liver disease. No respiratory compromise. Recently started bumex (last week) - start lasix 40, spiro 100 - received albumin this morning, consider continuing  # AKI 2/2 liver dysfunction. Cr 2.65 from normal - diuresis as above should  # Ascites Suspected. No s/s SBP - f/u u/s, will need para if ascites encountered as never has had  # Anemia Chronic disease likely  contributing but needs eval for blood loss, hgb was 13s 8 months ago, now is 7.9. reports melena earlier this year, no bleeding since. Unknown if has varices - GI consulted - continue PPI BID  # Coagulopathy Inr 2.5 in setting of liver disease, possible bleeding - vitamin k 10 mg IV once  # Alcohol abuse Says last drink was 2 weeks ago, denies other substance use, no signs withdrawal today. Does have hx etoh withdrawal seizure - monitor on CIWA but appears out of window for withdrawal - vitamins       DVT prophylaxis: scds Code Status: full Family Communication: none @ bedside  Level of care: Med-Surg Status is: Inpatient Remains inpatient appropriate because: severity of illness    Consultants:  GI  Procedures: pending  Antimicrobials:  none    Subjective: No pain, breathing comfortably  Objective: Vitals:   10/01/21 0600 10/01/21 0606 10/01/21 0751 10/01/21 0752  BP: 119/72  119/73 119/73  Pulse: 80  79 75  Resp: 18  12   Temp:  97.7 F (36.5 C) 97.8 F (36.6 C)   TempSrc:  Oral Oral   SpO2: 100%  100%   Weight:      Height:        Intake/Output Summary (Last 24 hours) at 10/01/2021 0829 Last data filed at 10/01/2021 0540 Gross per 24 hour  Intake --  Output 600 ml  Net -600 ml   Filed Weights   09/30/21 2357  Weight: 91.2 kg    Examination:  General exam: Appears calm and comfortable  Respiratory system: Clear to auscultation. Respiratory effort normal. Cardiovascular system: S1 & S2 heard, RRR. No JVD, murmurs, rubs, gallops or clicks. No pedal edema. Gastrointestinal system: distended abdomen, non tender Central nervous system: Alert and oriented. No focal neurological deficits. Extremities: Symmetric 5 x 5 power. Severe pitting edema to abdomen Skin: No rashes, lesions or ulcers Psychiatry: Judgement and insight appear normal. Mood & affect appropriate.     Data Reviewed: I have personally reviewed following labs and imaging  studies  CBC: Recent Labs  Lab 10/01/21 0003 10/01/21 0541  WBC 9.1  --   NEUTROABS 5.3  --   HGB 8.3* 7.9*  HCT 23.9*  --   MCV 93.7  --   PLT 112*  --    Basic Metabolic Panel: Recent Labs  Lab 10/01/21 0003  NA 132*  K 3.9  CL 100  CO2 26  GLUCOSE 114*  BUN 28*  CREATININE 2.65*  CALCIUM 8.0*   GFR: Estimated Creatinine Clearance: 43.2 mL/min (A) (by C-G formula based on SCr of 2.65 mg/dL (H)). Liver Function Tests: Recent Labs  Lab 10/01/21 0003  AST 150*  ALT 52*  ALKPHOS 112  BILITOT 6.8*  PROT 6.8  ALBUMIN <1.5*   Recent Labs  Lab 10/01/21 0003  LIPASE 102*   Recent Labs  Lab 10/01/21 0003  AMMONIA 164*   Coagulation Profile: Recent Labs  Lab 10/01/21 0234  INR 2.5*   Cardiac Enzymes: No results for input(s): "CKTOTAL", "CKMB", "CKMBINDEX", "TROPONINI" in the last 168 hours. BNP (last 3 results) No results for input(s): "PROBNP" in the last 8760 hours. HbA1C: No results for input(s): "HGBA1C" in the last 72 hours. CBG: No results for input(s): "GLUCAP" in the last 168 hours. Lipid Profile: No results for input(s): "CHOL", "HDL", "LDLCALC", "TRIG", "CHOLHDL", "LDLDIRECT" in the last 72 hours. Thyroid Function Tests: No results for input(s): "TSH", "T4TOTAL", "FREET4", "T3FREE", "THYROIDAB" in the last 72 hours. Anemia Panel: No results for input(s): "VITAMINB12", "FOLATE", "FERRITIN", "TIBC", "IRON", "RETICCTPCT" in the last 72 hours. Urine analysis:    Component Value Date/Time   COLORURINE AMBER (A) 10/01/2021 0003   APPEARANCEUR HAZY (A) 10/01/2021 0003   LABSPEC 1.012 10/01/2021 0003   PHURINE 5.0 10/01/2021 0003   GLUCOSEU NEGATIVE 10/01/2021 0003   HGBUR NEGATIVE 10/01/2021 0003   BILIRUBINUR NEGATIVE 10/01/2021 0003   KETONESUR NEGATIVE 10/01/2021 0003   PROTEINUR NEGATIVE 10/01/2021 0003   NITRITE NEGATIVE 10/01/2021 0003   LEUKOCYTESUR NEGATIVE 10/01/2021 0003   Sepsis  Labs: @LABRCNTIP (procalcitonin:4,lacticidven:4)  )No results found for this or any previous visit (from the past 240 hour(s)).       Radiology Studies: ASCITES (ABDOMEN LIMITED)  Result Date: 10/01/2021 CLINICAL DATA:  Abdominal distension. EXAM: LIMITED ABDOMEN ULTRASOUND FOR ASCITES TECHNIQUE: Limited ultrasound survey for ascites was performed in all four abdominal quadrants. COMPARISON:  None Available. FINDINGS: Moderate volume 4 quadrant abdominal ascites. IMPRESSION: Moderate volume abdominal ascites. Electronically Signed   By: 10/03/2021 M.D.   On: 10/01/2021 08:10   10/03/2021 Abdomen Limited RUQ (LIVER/GB)  Result Date: 10/01/2021 CLINICAL DATA:  Elevated liver function studies. EXAM: ULTRASOUND ABDOMEN LIMITED RIGHT UPPER QUADRANT COMPARISON:  Ultrasound 03/13/2021.  MRI success 14 2022 FINDINGS: Gallbladder: Markedly thickened gallbladder wall likely due to the surrounding ascites. No gallstones. No sonographic Murphy sign. Common bile duct: Diameter: 2.0 mm Liver: Cirrhotic changes involving the liver with a very irregular liver contour, dilated hepatic fissures and moderate volume ascites. The portal vein is patent but demonstrates reversed  flow consistent with portal venous hypertension. Other: Ascites. IMPRESSION: 1. Cirrhotic changes involving the liver with reversed flow in the portal vein. No worrisome hepatic lesions or intrahepatic biliary dilatation. Moderate volume abdominal ascites. 2. Markedly thickened gallbladder wall likely related to the surrounding ascites and probable low albumin. No gallstones. Electronically Signed   By: Rudie Meyer M.D.   On: 10/01/2021 08:09        Scheduled Meds:  furosemide  20 mg Intravenous BID   Continuous Infusions:  phytonadione (VITAMIN K) 10 mg in dextrose 5 % 50 mL IVPB       LOS: 0 days     Silvano Bilis, MD [facility]   If 7PM-7AM, please contact night-coverage [email or web address] Password [facility] 10/01/2021, 8:29 AM

## 2021-10-01 NOTE — ED Provider Notes (Signed)
[facility] Provider Note    Event Date/Time   First MD Initiated Contact with Patient 10/01/21 0132     (approximate)   History   Leg Swelling   HPI  Andrew Cummings is a 41 y.o. male who presents to the ED for evaluation of Leg Swelling   I reviewed DC summary from admission from 06/2020.  Admitted after alcohol withdrawal seizure, AKA and shoulder dislocation.    Patient presents to the ED for evaluation of subacutely worsening lower extremity swelling.  Reports increased abdominal bloating.  Poor appetite but no emesis, fever, diarrhea, trauma or falls   Physical Exam   Triage Vital Signs: ED Triage Vitals  Enc Vitals Group     BP 09/30/21 2356 119/62     Pulse Rate 09/30/21 2356 97     Resp 09/30/21 2356 17     Temp 09/30/21 2356 97.9 F (36.6 C)     Temp Source 09/30/21 2356 Oral     SpO2 09/30/21 2356 97 %     Weight 09/30/21 2357 201 lb (91.2 kg)     Height 09/30/21 2357 5\' 11"  (1.803 m)     Head Circumference --      Peak Flow --      Pain Score 09/30/21 2356 9     Pain Loc --      Pain Edu? --      Excl. in GC? --     Most recent vital signs: Vitals:   09/30/21 2356  BP: 119/62  Pulse: 97  Resp: 17  Temp: 97.9 F (36.6 C)  SpO2: 97%    General: Awake, no distress.  Seems uncomfortable, limited standing ambulate with a slow gait. CV:  Good peripheral perfusion.  Resp:  Normal effort.  Abd:  Mildly distended, not tense.  No peritoneal features MSK:  Diffuse lower extremity edema symmetrically without overlying skin changes or signs of trauma Neuro:  No focal deficits appreciated. Other:     ED Results / Procedures / Treatments   Labs (all labs ordered are listed, but only abnormal results are displayed) Labs Reviewed  CBC WITH DIFFERENTIAL/PLATELET - Abnormal; Notable for the following components:      Result Value   RBC 2.55 (*)    Hemoglobin 8.3 (*)    HCT 23.9 (*)    Platelets 112 (*)    All other  components within normal limits  COMPREHENSIVE METABOLIC PANEL - Abnormal; Notable for the following components:   Sodium 132 (*)    Glucose, Bld 114 (*)    BUN 28 (*)    Creatinine, Ser 2.65 (*)    Calcium 8.0 (*)    Albumin <1.5 (*)    AST 150 (*)    ALT 52 (*)    Total Bilirubin 6.8 (*)    GFR, Estimated 30 (*)    All other components within normal limits  LIPASE, BLOOD - Abnormal; Notable for the following components:   Lipase 102 (*)    All other components within normal limits  URINALYSIS, ROUTINE W REFLEX MICROSCOPIC - Abnormal; Notable for the following components:   Color, Urine AMBER (*)    APPearance HAZY (*)    All other components within normal limits  AMMONIA - Abnormal; Notable for the following components:   Ammonia 164 (*)    All other components within normal limits  PROTIME-INR - Abnormal; Notable for the following components:   Prothrombin Time 26.6 (*)    INR 2.5 (*)  All other components within normal limits  ETHANOL  TYPE AND SCREEN    EKG Sinus rhythm with a rate of 88 bpm.  Normal axis.  QTc 469.  No STEMI.  Nonspecific ST changes inferiorly and laterally  RADIOLOGY   Official radiology report(s): No results found.  PROCEDURES and INTERVENTIONS:  Procedures  Medications  albumin human 25 % solution 25 g (25 g Intravenous New Bag/Given 10/01/21 0229)     IMPRESSION / MDM / ASSESSMENT AND PLAN / ED COURSE  I reviewed the triage vital signs and the nursing notes.  Differential diagnosis includes, but is not limited to, anasarca, hypoalbuminemia, CHF, AKI, cellulitis, lymphedema  {Patient presents with symptoms of an acute illness or injury that is potentially life-threatening.  41 year old alcoholic presents to the ED with increased swelling, with evidence of hypoalbuminemia and AKI concerning for possible hepatorenal syndrome, requiring medical admission.  Elevated LFTs similar to previous, worsening hyperbilirubinemia but normal  alkaline phosphatase.  Doubt hepatobiliary obstruction such as choledocholithiasis.  He is noted to have a normocytic anemia without indications for transfusions, possibly an occult GI bleed but he has no symptoms of melena.  Ammonia is elevated, but he does not seem encephalopathic.  INR also elevated, further indicative of continued worsening hepatic dysfunction.  We will initiate albumin and consult with medicine for further admission, work-up and management.      FINAL CLINICAL IMPRESSION(S) / ED DIAGNOSES   Final diagnoses:  AKI (acute kidney injury) (HCC)  Edema due to hypoalbuminemia  Normocytic anemia     Rx / DC Orders   ED Discharge Orders     None        Note:  This document was prepared using Dragon voice recognition software and may include unintentional dictation errors.   Delton Prairie, MD 10/01/21 [phone]

## 2021-10-01 NOTE — Assessment & Plan Note (Addendum)
Suspect decompensated cirrhosis Hepatorenal syndrome Patient with thrombocytopenia, anemia, hyperammonemia, elevated transaminases and hyperbilirubinemia Patient received albumin in the ED GI consult for guidance We will get right upper quadrant ultrasound Hold lactulose, Lasix and spironolactone for now while n.p.o. for possible scope Monitor renal function

## 2021-10-02 DIAGNOSIS — K5900 Constipation, unspecified: Secondary | ICD-10-CM | POA: Diagnosis not present

## 2021-10-02 DIAGNOSIS — N179 Acute kidney failure, unspecified: Secondary | ICD-10-CM | POA: Diagnosis not present

## 2021-10-02 DIAGNOSIS — K7460 Unspecified cirrhosis of liver: Secondary | ICD-10-CM

## 2021-10-02 DIAGNOSIS — K7031 Alcoholic cirrhosis of liver with ascites: Principal | ICD-10-CM

## 2021-10-02 DIAGNOSIS — K709 Alcoholic liver disease, unspecified: Secondary | ICD-10-CM | POA: Diagnosis not present

## 2021-10-02 LAB — CBC
HCT: 24.7 % — ABNORMAL LOW (ref 39.0–52.0)
Hemoglobin: 8.6 g/dL — ABNORMAL LOW (ref 13.0–17.0)
MCH: 32.6 pg (ref 26.0–34.0)
MCHC: 34.8 g/dL (ref 30.0–36.0)
MCV: 93.6 fL (ref 80.0–100.0)
Platelets: 108 10*3/uL — ABNORMAL LOW (ref 150–400)
RBC: 2.64 MIL/uL — ABNORMAL LOW (ref 4.22–5.81)
RDW: 14.7 % (ref 11.5–15.5)
WBC: 8.6 10*3/uL (ref 4.0–10.5)
nRBC: 0 % (ref 0.0–0.2)

## 2021-10-02 LAB — PROTIME-INR
INR: 2.4 — ABNORMAL HIGH (ref 0.8–1.2)
Prothrombin Time: 26 seconds — ABNORMAL HIGH (ref 11.4–15.2)

## 2021-10-02 LAB — COMPREHENSIVE METABOLIC PANEL
ALT: 47 U/L — ABNORMAL HIGH (ref 0–44)
AST: 125 U/L — ABNORMAL HIGH (ref 15–41)
Albumin: 1.5 g/dL — ABNORMAL LOW (ref 3.5–5.0)
Alkaline Phosphatase: 116 U/L (ref 38–126)
Anion gap: 6 (ref 5–15)
BUN: 25 mg/dL — ABNORMAL HIGH (ref 6–20)
CO2: 25 mmol/L (ref 22–32)
Calcium: 7.9 mg/dL — ABNORMAL LOW (ref 8.9–10.3)
Chloride: 105 mmol/L (ref 98–111)
Creatinine, Ser: 1.87 mg/dL — ABNORMAL HIGH (ref 0.61–1.24)
GFR, Estimated: 46 mL/min — ABNORMAL LOW (ref >60)
Glucose, Bld: 112 mg/dL — ABNORMAL HIGH (ref 70–99)
Potassium: 4.1 mmol/L (ref 3.5–5.1)
Sodium: 136 mmol/L (ref 135–145)
Total Bilirubin: 5.3 mg/dL — ABNORMAL HIGH (ref 0.3–1.2)
Total Protein: 6.7 g/dL (ref 6.5–8.1)

## 2021-10-02 LAB — HEPATITIS B E ANTIGEN: Hep B E Ag: NEGATIVE

## 2021-10-02 LAB — PATHOLOGIST SMEAR REVIEW

## 2021-10-02 LAB — ALPHA-1-ANTITRYPSIN: A-1 Antitrypsin, Ser: 167 mg/dL — ABNORMAL HIGH (ref 95–164)

## 2021-10-02 LAB — HEPATITIS B DNA, ULTRAQUANTITATIVE, PCR
HBV DNA SERPL PCR-ACNC: NOT DETECTED IU/mL
HBV DNA SERPL PCR-LOG IU: UNDETERMINED log10 IU/mL

## 2021-10-02 LAB — ANTI-SMOOTH MUSCLE ANTIBODY, IGG: F-Actin IgG: 59 Units — ABNORMAL HIGH (ref 0–19)

## 2021-10-02 LAB — PROTEIN, BODY FLUID (OTHER): Total Protein, Body Fluid Other: 1.1 g/dL

## 2021-10-02 LAB — ANA: Anti Nuclear Antibody (ANA): NEGATIVE

## 2021-10-02 LAB — CERULOPLASMIN: Ceruloplasmin: 15.2 mg/dL — ABNORMAL LOW (ref 16.0–31.0)

## 2021-10-02 LAB — MITOCHONDRIAL ANTIBODIES: Mitochondrial M2 Ab, IgG: <20 Units (ref 0.0–20.0)

## 2021-10-02 LAB — RPR: RPR Ser Ql: NONREACTIVE

## 2021-10-02 LAB — HEPATITIS B E ANTIBODY: Hep B E Ab: NEGATIVE

## 2021-10-02 MED ORDER — ENSURE ENLIVE PO LIQD
237.0000 mL | Freq: Two times a day (BID) | ORAL | Status: DC
  Administered 2021-10-02 – 2021-10-03 (×3): 237 mL via ORAL

## 2021-10-02 MED ORDER — ALBUMIN HUMAN 25 % IV SOLN
25.0000 g | Freq: Every day | INTRAVENOUS | Status: AC
  Administered 2021-10-03: 25 g via INTRAVENOUS
  Filled 2021-10-02: qty 100

## 2021-10-02 MED ORDER — LACTULOSE 10 GM/15ML PO SOLN
10.0000 g | Freq: Two times a day (BID) | ORAL | Status: DC
  Administered 2021-10-02 – 2021-10-05 (×7): 10 g via ORAL
  Filled 2021-10-02 (×7): qty 30

## 2021-10-02 MED ORDER — KATE FARMS STANDARD 1.4 PO LIQD
325.0000 mL | Freq: Every day | ORAL | Status: DC
  Administered 2021-10-04: 325 mL via ORAL
  Filled 2021-10-02: qty 325

## 2021-10-02 MED ORDER — PROSOURCE PLUS PO LIQD
30.0000 mL | Freq: Two times a day (BID) | ORAL | Status: DC
  Administered 2021-10-04 – 2021-10-05 (×2): 30 mL via ORAL
  Filled 2021-10-02 (×6): qty 30

## 2021-10-02 MED ORDER — SODIUM CHLORIDE 0.9 % IV SOLN
2.0000 g | INTRAVENOUS | Status: DC
  Administered 2021-10-02 – 2021-10-04 (×3): 2 g via INTRAVENOUS
  Filled 2021-10-02: qty 20
  Filled 2021-10-02 (×2): qty 2

## 2021-10-02 NOTE — Progress Notes (Addendum)
Received call from ED reception that patient was in ED waiting room with a large bag and swaying. RN retrieved wheelchair and then retrieved pt. Pt stated he went out to his car to get his bag of clothes for a shower. Pt then asked if he could take a shower today. Will contact MD for shower privileges. ED reception noted pt was swaying when he was walking in ED waiting room. This RN brought pt back to his room. Pt's sister was in unit looking for pt. Pt was asked if he had liquor in his bag, pt allowed RN to search bag for liquor while sister was in the room, per her suggestion. No liquor found. Pt denies going outside to drink. Pt states "I do not have a death wish". Pt informed he was to stay on unit only when walking. Pt agreed.

## 2021-10-02 NOTE — Plan of Care (Signed)
  Problem: Education: Goal: Knowledge of General Education information will improve Description: Including pain rating scale, medication(s)/side effects and non-pharmacologic comfort measures Outcome: Progressing   Problem: Nutrition: Goal: Adequate nutrition will be maintained Outcome: Progressing   Problem: Coping: Goal: Level of anxiety will decrease Outcome: Progressing   Problem: Pain Managment: Goal: General experience of comfort will improve Outcome: Progressing   Problem: Safety: Goal: Ability to remain free from injury will improve Outcome: Progressing   Problem: Skin Integrity: Goal: Risk for impaired skin integrity will decrease Outcome: Progressing   Problem: Education: Goal: Knowledge of General Education information will improve Description: Including pain rating scale, medication(s)/side effects and non-pharmacologic comfort measures Outcome: Progressing

## 2021-10-02 NOTE — Progress Notes (Signed)
Jonathon Bellows , MD [address] [place], [place], [place], [address] [address] [place], [place], [place], [address] Phone: [phone]  Fax: [phone]   Nil Page is being followed for decompensated alcoholic liver cirrhosis  Subjective: No complaints, feels well, has constipation    Objective: Vital signs in last 24 hours: Vitals:   10/01/21 1756 10/01/21 2207 10/02/21 0413 10/02/21 0913  BP: 103/63 (!) 107/58 107/64 107/70  Pulse: 86 78 95 84  Resp: 18 18 20 18   Temp: 97.6 F (36.4 C) 97.8 F (36.6 C) 98.4 F (36.9 C)   TempSrc: Oral Oral Oral   SpO2: 100% 100% 100% 100%  Weight:      Height:       Weight change:   Intake/Output Summary (Last 24 hours) at 10/02/2021 1133 Last data filed at 10/02/2021 1035 Gross per 24 hour  Intake 508.11 ml  Output --  Net 508.11 ml     Exam: Heart:: Regular rate and rhythm Lungs: normal Abdomen: distended , non tender, free fluid +, no tenderness guading or rgidity    Lab Results: @LABTEST2 @ Micro Results: Recent Results (from the past 240 hour(s))  Body fluid culture w Gram Stain     Status: None (Preliminary result)   Collection Time: 10/01/21  3:27 PM   Specimen: PATH Cytology Peritoneal fluid  Result Value Ref Range Status   Specimen Description   Final    CYTO PERI Performed at [facility], [address], [place], [place] [address]    Special Requests   Final    NONE Performed at [facility], [address] [place], [place] [address]    Gram Stain   Final    CYTOSPIN SMEAR WBC PRESENT, PREDOMINANTLY MONONUCLEAR NO ORGANISMS SEEN    Culture   Final    NO GROWTH < 24 HOURS Performed at [facility], [place] [address] [place], [place] [address]    Report Status PENDING  Incomplete   Studies/Results: US Paracentesis  Result Date: 10/01/2021 INDICATION: Patient with acute liver failure and alcoholic liver cirrhosis with ascites request received for diagnostic  and therapeutic paracentesis. EXAM: ULTRASOUND GUIDED  PARACENTESIS MEDICATIONS: Local 1% lidocaine only. COMPLICATIONS: None immediate. PROCEDURE: Informed written consent was obtained from the patient after a discussion of the risks, benefits and alternatives to treatment. A timeout was performed prior to the initiation of the procedure. Initial ultrasound scanning demonstrates a small to moderate amount of ascites within the right upper abdominal quadrant. The right upper abdomen was prepped and draped in the usual sterile fashion. 1% lidocaine was used for local anesthesia. Following this, a 19 gauge, 7-cm, Yueh catheter was introduced. An ultrasound image was saved for documentation purposes. The paracentesis was performed. The catheter was removed and a dressing was applied. The patient tolerated the procedure well without immediate post procedural complication. FINDINGS: A total of approximately 1.5 L of clear yellow fluid was removed. Samples were sent to the laboratory as requested by the clinical team. IMPRESSION: Successful ultrasound-guided paracentesis yielding 1.5 liters of peritoneal fluid. PLAN: If the patient eventually requires >/=2 paracenteses in a 30 day period, candidacy for formal evaluation by the [facility] will be assessed. This exam was performed by Tsosie Billing PA-C, and was supervised and interpreted by Dr. Anselm Pancoast. Electronically Signed   By: Markus Daft M.D.   On: 10/01/2021 16:49   US LIVER DOPPLER  Result Date: 10/01/2021 CLINICAL DATA:  Hepatic failure.  EXAM: DUPLEX ULTRASOUND OF LIVER TECHNIQUE: Color and duplex Doppler ultrasound was performed to evaluate the hepatic in-flow and out-flow vessels. COMPARISON:  Right upper quadrant ultrasound earlier today. FINDINGS: Portal Vein Velocities Main:  26-43 cm/sec Right:  25 cm/sec Left:  14 cm/sec Hepatic Vein Velocities Right:  42 cm/sec Middle:  30 cm/sec Left:  40 cm/sec Hepatic  Artery Velocity:  127 cm/sec Splenic Vein Velocity:  17 cm/sec Varices: None visualized. Ascites: Ascites present in the visualized peritoneal cavity. Splenomegaly with estimated splenic volume of at least 450 mL. Recanalized umbilical vein implicating underlying portal hypertension. Portal vein waveforms demonstrate to and fro flow. No evidence of portal vein thrombus. Hepatic vein waveforms are somewhat pulsatile. This can be seen in the setting right heart failure but also in the setting of cirrhosis. Liver morphology is consistent with cirrhosis. IMPRESSION: 1. Evidence of portal hypertension with to and fro flow in the portal vein, recanalization of the umbilical vein and splenomegaly. No evidence of portal vein thrombosis. 2. Ascites in the visualized peritoneal cavity. 3. Cirrhosis of the liver. Electronically Signed   By: Irish Lack M.D.   On: 10/01/2021 16:00   [place] ASCITES (ABDOMEN LIMITED)  Result Date: 10/01/2021 CLINICAL DATA:  Abdominal distension. EXAM: LIMITED ABDOMEN ULTRASOUND FOR ASCITES TECHNIQUE: Limited ultrasound survey for ascites was performed in all four abdominal quadrants. COMPARISON:  None Available. FINDINGS: Moderate volume 4 quadrant abdominal ascites. IMPRESSION: Moderate volume abdominal ascites. Electronically Signed   By: Rudie Meyer M.D.   On: 10/01/2021 08:10   US Abdomen Limited RUQ (LIVER/GB)  Result Date: 10/01/2021 CLINICAL DATA:  Elevated liver function studies. EXAM: ULTRASOUND ABDOMEN LIMITED RIGHT UPPER QUADRANT COMPARISON:  Ultrasound 03/13/2021.  MRI success 14 2022 FINDINGS: Gallbladder: Markedly thickened gallbladder wall likely due to the surrounding ascites. No gallstones. No sonographic Murphy sign. Common bile duct: Diameter: 2.0 mm Liver: Cirrhotic changes involving the liver with a very irregular liver contour, dilated hepatic fissures and moderate volume ascites. The portal vein is patent but demonstrates reversed flow consistent with portal  venous hypertension. Other: Ascites. IMPRESSION: 1. Cirrhotic changes involving the liver with reversed flow in the portal vein. No worrisome hepatic lesions or intrahepatic biliary dilatation. Moderate volume abdominal ascites. 2. Markedly thickened gallbladder wall likely related to the surrounding ascites and probable low albumin. No gallstones. Electronically Signed   By: Rudie Meyer M.D.   On: 10/01/2021 08:09   Medications: I have reviewed the patient's current medications. Scheduled Meds:  folic acid  1 mg Oral Daily   multivitamin with minerals  1 tablet Oral Daily   pantoprazole (PROTONIX) IV  40 mg Intravenous Q12H   phytonadione  10 mg Subcutaneous Daily   thiamine  100 mg Oral Daily   Or   thiamine  100 mg Intravenous Daily   Continuous Infusions:  albumin human 25 g (10/02/21 0924)   cefTRIAXone (ROCEPHIN)  IV     PRN Meds:.acetaminophen **OR** acetaminophen, LORazepam **OR** LORazepam, ondansetron **OR** ondansetron (ZOFRAN) IV   Assessment: Principal Problem:   Alcoholic liver disease (HCC) Active Problems:   Thrombocytopenia (HCC)   Anasarca   Hepatorenal syndrome (HCC)   Pancreatitis   Alcohol use disorder   History of seizure due to alcohol withdrawal   Anemia   AKI (acute kidney injury) (HCC)   Yohan Holtzer is a 41 y.o. y/o male with history of alcohol abuse presented to the emergency room with edema weakness found to be in AKI possibly acute liver failure and anemia.  No overt blood loss noted I been consulted for anemia.  He has decompensated alcoholic liver cirrhosis with ascites complicated by acute kidney injury.  Kidney function is improved with albumin infusion creatinine down from 2.65-1.87.  Acetic fluid SAG suggests secondary to portal hypertension no evidence of SBP.  Doppler ultrasound shows no evidence of portal vein thrombosis     Plan 1.  Elevated INR could be from acute liver failure: Vitamin K 10 mg subcutaneous once daily for 3 days  monitor INR daily.  No role in checking ammonia hepatic encephalopathy with medical diagnosis rule out other causes for confusion. 2.  Acute kidney injury suggest daily albumin 25 g to rule out prerenal causes ,obtain renal ultrasound to rule out obstruction if creatinine does not improve with improved volume through albumin and need to suspect hepatorenal syndrome which is a diagnosis of exclusion.  Any nephrotoxic drugs.  I have taken the pleasure to stop the patient's Lasix and Aldactone in view of AKI.  Continue albumin for 2 more days 3.  Follow-up viral hepatitis and autoimmune screen 4.  IV octreotide, antibiotics prophylaxis for GI bleeding in the setting of cirrhosis 5.  Once volume status is improved will consider endoscopy for anemia.  No rush as he has no active bleeding noted recently.Can drain all ascitic fluid, no limit , give albumin after paracentesis 25-50 grams , 25 % 6.  Hold off on starting any diuretics till kidney function recovers. Low salt diet < 2000mg  sodium per day .  7. Start lactulose for constipation 15 cc tid    LOS: 1 day   , MD 10/02/2021, 11:33 AM

## 2021-10-02 NOTE — Progress Notes (Addendum)
Initial Nutrition Assessment  DOCUMENTATION CODES:   Non-severe (moderate) malnutrition in context of chronic illness  INTERVENTION:   D[employer] [place] chocolate supplement po daily- each supplement provides 330kcal and 16g protein  Ensure Enlive po BID, each supplement provides 350 kcal and 20 grams of protein.  Pro-Source 31m BID via tube, provides 40kcal and 11g of protein per serving    MVI, thiamine and folic acid po daily   Pt at high refeed risk; recommend monitor potassium, magnesium and phosphorus labs daily until stable  2 gram sodium diet   Low sodium/high protein diet education   NUTRITION DIAGNOSIS:   Moderate Malnutrition related to chronic illness (cirrhosis, etoh abuse) as evidenced by moderate fat depletion, moderate muscle depletion, edema.  GOAL:   Patient will meet greater than or equal to 90% of their needs  MONITOR:   PO intake, Supplement acceptance, Labs, Weight trends, Skin, I & O's  REASON FOR ASSESSMENT:   Malnutrition Screening Tool    ASSESSMENT:   41y.o. male with medical history significant for alcohol use disorder with complications of alcohol withdrawal seizures, alcoholic ketoacidosis, gastritis and thrombocytopenia who is admitted with cirrhosis, ascites, anasarca and AKI.  Pt s/p paracentesis 9/14 with 1.5L output  Met with pt in room today. Pt reports decreased appetite and oral intake for at least a week pta r/t early satiety. Pt with severe ascites and anasarca. Pt reports that he has been unable to walk good pta as he reports his legs are too swollen and he has been having burning and tingling in his feet. Pt reports that he has been drinking some Ensure but reports that sometimes it gives him diarrhea. Pt has been taking a daily MVI at home. Pt reports that he has lost a lot of weight. Pt reports his UBW was ~220lbs but reports that he lost down to 179lbs but now is back up to 200lbs r/t fluid retention. RD discussed with pt the  importance of adequate nutrition needed to preserve lean muscle and help with fluid retention. RD provided pt with low sodium/high protein diet education today. RD will add supplements to help pt meet his estimated needs. Pt would like to try a variety. Pt is at high refeed risk. Pt documented to be eating 100% of meals in hospital.   Medications reviewed and include: folic acid, lactulose, MVI, protonix, vitamin K, thiamine, albumin, ceftriaxone   Labs reviewed: K 4.1 wnl, BUN 25(H), creat 1.87(H), alb 1.5(L)AST 125(H), ALT 47(H), tbili 5.3(H) Lipase- 102(H), ammonia 164(H)- 9/14 Hgb 8.6(L), Hct 24.7(L)  NUTRITION - FOCUSED PHYSICAL EXAM:  Flowsheet Row Most Recent Value  Orbital Region No depletion  Upper Arm Region Moderate depletion  Thoracic and Lumbar Region Moderate depletion  Buccal Region No depletion  Temple Region Mild depletion  Clavicle Bone Region Moderate depletion  Clavicle and Acromion Bone Region Moderate depletion  Scapular Bone Region Moderate depletion  Dorsal Hand Mild depletion  Patellar Region Unable to assess  Anterior Thigh Region Unable to assess  Posterior Calf Region Unable to assess  Edema (RD Assessment) Severe  Hair Reviewed  Eyes Reviewed  Mouth Reviewed  Skin Reviewed  Nails Reviewed   Diet Order:   Diet Order             Diet 2 gram sodium Room service appropriate? Yes; Fluid consistency: Thin; Fluid restriction: 1200 mL Fluid  Diet effective now                  EDUCATION  NEEDS:   Education needs have been addressed  Skin:  Skin Assessment: Reviewed RN Assessment  Last BM:  9/12- constipation per pt report  Height:   Ht Readings from Last 1 Encounters:  09/30/21 _0  (1.803 m)    Weight:   Wt Readings from Last 1 Encounters:  09/30/21 91.2 kg    Ideal Body Weight:  78.1 kg  BMI:  Body mass index is 28.03 kg/m.  Estimated Nutritional Needs:   Kcal:  2400-2700kcal/day  Protein:  >120g/day  Fluid:   2.0L/day  Koleen Distance MS, RD, LDN Please refer to [facility] for RD and/or RD on-call/weekend/after hours pager

## 2021-10-02 NOTE — Progress Notes (Signed)
PHARMACY NOTE:  ANTIMICROBIAL DOSAGE ADJUSTMENT  Current antimicrobial regimen includes a mismatch between antimicrobial dosage and indication.  As per policy approved by the Pharmacy & Therapeutics and Medical Executive Committees, the antimicrobial dosage will be adjusted accordingly.  Current antimicrobial dosage:  ceftriaxone 1 gram IV every 24 hours  Indication: SBP prophylaxis  Renal Function:  Estimated Creatinine Clearance: 61.3 mL/min (A) (by C-G formula based on SCr of 1.87 mg/dL (H)). []      On intermittent HD, scheduled: []      On CRRT    Antimicrobial dosage has been changed to:  ceftriaxone 2 grams IV every 24 hours   Thank you for allowing pharmacy to be a part of this patient's care.  , [facility] 10/02/2021 8:34 AM

## 2021-10-02 NOTE — Progress Notes (Signed)
PROGRESS NOTE    Andrew Cummings  [id number]:[id number] DOB: 1980-11-12 DOA: 10/01/2021 PCP: Farris Has, MD  Outpatient Specialists: none    Brief Narrative:   From admission h and p Andrew Cummings is a 41 y.o. male with medical history significant for Alcohol use disorder with complications of alcohol withdrawal seizures, alcoholic ketoacidosis and gastritis, thrombocytopenia secondary to alcohol use who presents to the ED with progressively worsening lower extremity edema and alcohol bloating, poor appetite and weakness.  He denies shortness of breath or leg pain or chest pain.  He denies abdominal pain ED course and data review: Vitals within normal limits.  Lab work with several abnormalities consistent with liver disease and also with renal disease including creatinine of 2.65, up from 0.55 about 8 months prior.  AST/ALT 156/52 with total bilirubin 6.8.  Albumin less than 1.5.  Sodium 132.  CBC notable for hemoglobin of 8.3, down from 13.7 about 8 months prior and platelets 112,000 up from 45,000 about the same time ago.  Lipase elevated at 102 and ammonia 164. Imaging, not done from the ED   Assessment & Plan:   Principal Problem:   Alcoholic liver disease (HCC) Active Problems:   Hepatorenal syndrome (HCC)   Thrombocytopenia (HCC)   Anasarca   Pancreatitis   Alcohol use disorder   History of seizure due to alcohol withdrawal   Anemia   AKI (acute kidney injury) (HCC)   Constipation   Ascites due to alcoholic cirrhosis (HCC)  # Alcohol liver disease, cirrhosis Hasn't had variceal screen. Does not appear encephalopathic. Hiv, hcv, hbv, rpr neg. No hcc on ruq u/s. Portal system patent. TOC consulted  # Anasarca 2/2 decompensated liver disease. No respiratory compromise. Recently started bumex (last week) - hold diuresis given aki - continue albumin gi advises 3 days, today day 2  # AKI 2/2 liver dysfunction. Cr 2.65 from normal, improved to 1.87 today with albumin -  albumin as above  # Ascites 2/2 portal htn, no sbp on 1.5 L paracentesis 9/14  # Anemia Chronic disease likely contributing but needs eval for blood loss, hgb was 13s 8 months ago, now is 7.9. reports melena earlier this year, no bleeding since. Unknown if has varices - ppi, ceftriaxone - GI following, deferring EGD for now given no clear active bleeding and stable hgb  # Coagulopathy Inr 2.5 in setting of liver disease, possible bleeding. 2.4 today after vit k yesterday - vitamin k 10 mg sub q for 2 more days, today is day 2  # Alcohol abuse Says last drink was 2 weeks ago, denies other substance use, no signs withdrawal today. Does have hx etoh withdrawal seizure - monitor on CIWA but appears out of window for withdrawal - vitamins  # Constipation - lactulose started by GI     DVT prophylaxis: scds Code Status: full Family Communication: none @ bedside  Level of care: Med-Surg Status is: Inpatient Remains inpatient appropriate because: severity of illness    Consultants:  GI  Procedures: Paracentesis 9/14  Antimicrobials:  ceftriaxone    Subjective: No pain, breathing comfortably, swelling improved from yesterday subjectively  Objective: Vitals:   10/01/21 1756 10/01/21 2207 10/02/21 0413 10/02/21 0913  BP: 103/63 (!) 107/58 107/64 107/70  Pulse: 86 78 95 84  Resp: 18 18 20 18   Temp: 97.6 F (36.4 C) 97.8 F (36.6 C) 98.4 F (36.9 C)   TempSrc: Oral Oral Oral   SpO2: 100% 100% 100% 100%  Weight:  Height:        Intake/Output Summary (Last 24 hours) at 10/02/2021 1323 Last data filed at 10/02/2021 1035 Gross per 24 hour  Intake 508.11 ml  Output --  Net 508.11 ml   Filed Weights   09/30/21 2357  Weight: 91.2 kg    Examination:  General exam: Appears calm and comfortable  Respiratory system: Clear to auscultation. Respiratory effort normal. Cardiovascular system: S1 & S2 heard, RRR. No JVD, murmurs, rubs, gallops or clicks. No pedal  edema. Gastrointestinal system: distended abdomen, non tender Central nervous system: Alert and oriented. No focal neurological deficits. Extremities: Symmetric 5 x 5 power. Severe pitting edema to abdomen Skin: No rashes, lesions or ulcers Psychiatry: Judgement and insight appear normal. Mood & affect appropriate.     Data Reviewed: I have personally reviewed following labs and imaging studies  CBC: Recent Labs  Lab 10/01/21 0003 10/01/21 0541 10/01/21 1330 10/02/21 0355  WBC 9.1  --   --  8.6  NEUTROABS 5.3  --   --   --   HGB 8.3* 7.9* 8.7* 8.6*  HCT 23.9*  --   --  24.7*  MCV 93.7  --   --  93.6  PLT 112*  --   --  108*   Basic Metabolic Panel: Recent Labs  Lab 10/01/21 0003 10/02/21 0355  NA 132* 136  K 3.9 4.1  CL 100 105  CO2 26 25  GLUCOSE 114* 112*  BUN 28* 25*  CREATININE 2.65* 1.87*  CALCIUM 8.0* 7.9*   GFR: Estimated Creatinine Clearance: 61.3 mL/min (A) (by C-G formula based on SCr of 1.87 mg/dL (H)). Liver Function Tests: Recent Labs  Lab 10/01/21 0003 10/02/21 0355  AST 150* 125*  ALT 52* 47*  ALKPHOS 112 116  BILITOT 6.8* 5.3*  PROT 6.8 6.7  ALBUMIN <1.5* 1.5*   Recent Labs  Lab 10/01/21 0003  LIPASE 102*   Recent Labs  Lab 10/01/21 0003  AMMONIA 164*   Coagulation Profile: Recent Labs  Lab 10/01/21 0234 10/02/21 1152  INR 2.5* 2.4*   Cardiac Enzymes: No results for input(s): "CKTOTAL", "CKMB", "CKMBINDEX", "TROPONINI" in the last 168 hours. BNP (last 3 results) No results for input(s): "PROBNP" in the last 8760 hours. HbA1C: No results for input(s): "HGBA1C" in the last 72 hours. CBG: No results for input(s): "GLUCAP" in the last 168 hours. Lipid Profile: No results for input(s): "CHOL", "HDL", "LDLCALC", "TRIG", "CHOLHDL", "LDLDIRECT" in the last 72 hours. Thyroid Function Tests: No results for input(s): "TSH", "T4TOTAL", "FREET4", "T3FREE", "THYROIDAB" in the last 72 hours. Anemia Panel: Recent Labs     10/01/21 1330  TIBC NOT CALCULATED  IRON 87   Urine analysis:    Component Value Date/Time   COLORURINE AMBER (A) 10/01/2021 0003   APPEARANCEUR HAZY (A) 10/01/2021 0003   LABSPEC 1.012 10/01/2021 0003   PHURINE 5.0 10/01/2021 0003   GLUCOSEU NEGATIVE 10/01/2021 0003   HGBUR NEGATIVE 10/01/2021 0003   BILIRUBINUR NEGATIVE 10/01/2021 0003   KETONESUR NEGATIVE 10/01/2021 0003   PROTEINUR NEGATIVE 10/01/2021 0003   NITRITE NEGATIVE 10/01/2021 0003   LEUKOCYTESUR NEGATIVE 10/01/2021 0003   Sepsis Labs: @LABRCNTIP (procalcitonin:4,lacticidven:4)  ) Recent Results (from the past 240 hour(s))  Body fluid culture w Gram Stain     Status: None (Preliminary result)   Collection Time: 10/01/21  3:27 PM   Specimen: PATH Cytology Peritoneal fluid  Result Value Ref Range Status   Specimen Description   Final    CYTO PERI Performed at  [facility], [address] [place], [place] [address]    Special Requests   Final    NONE Performed at [facility], [address] [place], [place] [address]    Gram Stain   Final    CYTOSPIN SMEAR WBC PRESENT, PREDOMINANTLY MONONUCLEAR NO ORGANISMS SEEN    Culture   Final    NO GROWTH < 24 HOURS Performed at [facility], [address], [place], [place] [address]    Report Status PENDING  Incomplete         Radiology Studies: US Paracentesis  Result Date: 10/01/2021 INDICATION: Patient with acute liver failure and alcoholic liver cirrhosis with ascites request received for diagnostic and therapeutic paracentesis. EXAM: ULTRASOUND GUIDED  PARACENTESIS MEDICATIONS: Local 1% lidocaine only. COMPLICATIONS: None immediate. PROCEDURE: Informed written consent was obtained from the patient after a discussion of the risks, benefits and alternatives to treatment. A timeout was performed prior to the initiation of the procedure. Initial ultrasound scanning demonstrates a small to moderate amount of ascites within the right  upper abdominal quadrant. The right upper abdomen was prepped and draped in the usual sterile fashion. 1% lidocaine was used for local anesthesia. Following this, a 19 gauge, 7-cm, Yueh catheter was introduced. An ultrasound image was saved for documentation purposes. The paracentesis was performed. The catheter was removed and a dressing was applied. The patient tolerated the procedure well without immediate post procedural complication. FINDINGS: A total of approximately 1.5 L of clear yellow fluid was removed. Samples were sent to the laboratory as requested by the clinical team. IMPRESSION: Successful ultrasound-guided paracentesis yielding 1.5 liters of peritoneal fluid. PLAN: If the patient eventually requires >/=2 paracenteses in a 30 day period, candidacy for formal evaluation by the [facility] Interventional Radiology Portal Hypertension Clinic will be assessed. This exam was performed by Pattricia Boss PA-C, and was supervised and interpreted by Dr. Lowella Dandy. Electronically Signed   By: Richarda Overlie M.D.   On: 10/01/2021 16:49   US LIVER DOPPLER  Result Date: 10/01/2021 CLINICAL DATA:  Hepatic failure. EXAM: DUPLEX ULTRASOUND OF LIVER TECHNIQUE: Color and duplex Doppler ultrasound was performed to evaluate the hepatic in-flow and out-flow vessels. COMPARISON:  Right upper quadrant ultrasound earlier today. FINDINGS: Portal Vein Velocities Main:  26-43 cm/sec Right:  25 cm/sec Left:  14 cm/sec Hepatic Vein Velocities Right:  42 cm/sec Middle:  30 cm/sec Left:  40 cm/sec Hepatic Artery Velocity:  127 cm/sec Splenic Vein Velocity:  17 cm/sec Varices: None visualized. Ascites: Ascites present in the visualized peritoneal cavity. Splenomegaly with estimated splenic volume of at least 450 mL. Recanalized umbilical vein implicating underlying portal hypertension. Portal vein waveforms demonstrate to and fro flow. No evidence of portal vein thrombus. Hepatic vein waveforms are somewhat pulsatile. This can be seen in  the setting right heart failure but also in the setting of cirrhosis. Liver morphology is consistent with cirrhosis. IMPRESSION: 1. Evidence of portal hypertension with to and fro flow in the portal vein, recanalization of the umbilical vein and splenomegaly. No evidence of portal vein thrombosis. 2. Ascites in the visualized peritoneal cavity. 3. Cirrhosis of the liver. Electronically Signed   By: Irish Lack M.D.   On: 10/01/2021 16:00   [place] ASCITES (ABDOMEN LIMITED)  Result Date: 10/01/2021 CLINICAL DATA:  Abdominal distension. EXAM: LIMITED ABDOMEN ULTRASOUND FOR ASCITES TECHNIQUE: Limited ultrasound survey for ascites was performed in all four abdominal quadrants. COMPARISON:  None Available. FINDINGS: Moderate volume 4 quadrant abdominal ascites. IMPRESSION:  Moderate volume abdominal ascites. Electronically Signed   By: Rudie Meyer M.D.   On: 10/01/2021 08:10   US Abdomen Limited RUQ (LIVER/GB)  Result Date: 10/01/2021 CLINICAL DATA:  Elevated liver function studies. EXAM: ULTRASOUND ABDOMEN LIMITED RIGHT UPPER QUADRANT COMPARISON:  Ultrasound 03/13/2021.  MRI success 14 2022 FINDINGS: Gallbladder: Markedly thickened gallbladder wall likely due to the surrounding ascites. No gallstones. No sonographic Murphy sign. Common bile duct: Diameter: 2.0 mm Liver: Cirrhotic changes involving the liver with a very irregular liver contour, dilated hepatic fissures and moderate volume ascites. The portal vein is patent but demonstrates reversed flow consistent with portal venous hypertension. Other: Ascites. IMPRESSION: 1. Cirrhotic changes involving the liver with reversed flow in the portal vein. No worrisome hepatic lesions or intrahepatic biliary dilatation. Moderate volume abdominal ascites. 2. Markedly thickened gallbladder wall likely related to the surrounding ascites and probable low albumin. No gallstones. Electronically Signed   By: Rudie Meyer M.D.   On: 10/01/2021 08:09        Scheduled  Meds:  folic acid  1 mg Oral Daily   lactulose  10 g Oral BID   multivitamin with minerals  1 tablet Oral Daily   pantoprazole (PROTONIX) IV  40 mg Intravenous Q12H   phytonadione  10 mg Subcutaneous Daily   thiamine  100 mg Oral Daily   Or   thiamine  100 mg Intravenous Daily   Continuous Infusions:  [START ON 10/03/2021] albumin human     cefTRIAXone (ROCEPHIN)  IV 2 g (10/02/21 1215)     LOS: 1 day     Silvano Bilis, MD [facility]   If 7PM-7AM, please contact night-coverage [email or web address] Password [email or web address] 10/02/2021, 1:23 PM

## 2021-10-03 DIAGNOSIS — K7290 Hepatic failure, unspecified without coma: Secondary | ICD-10-CM | POA: Diagnosis not present

## 2021-10-03 DIAGNOSIS — E440 Moderate protein-calorie malnutrition: Secondary | ICD-10-CM | POA: Insufficient documentation

## 2021-10-03 DIAGNOSIS — K7460 Unspecified cirrhosis of liver: Secondary | ICD-10-CM | POA: Diagnosis not present

## 2021-10-03 DIAGNOSIS — N179 Acute kidney failure, unspecified: Secondary | ICD-10-CM | POA: Diagnosis not present

## 2021-10-03 DIAGNOSIS — K7031 Alcoholic cirrhosis of liver with ascites: Secondary | ICD-10-CM

## 2021-10-03 DIAGNOSIS — K709 Alcoholic liver disease, unspecified: Secondary | ICD-10-CM | POA: Diagnosis not present

## 2021-10-03 LAB — CMV DNA, QUANTITATIVE, PCR
CMV DNA Quant: NEGATIVE IU/mL
Log10 CMV Qn DNA Pl: UNDETERMINED log10 IU/mL

## 2021-10-03 LAB — COMPREHENSIVE METABOLIC PANEL
ALT: 38 U/L (ref 0–44)
AST: 100 U/L — ABNORMAL HIGH (ref 15–41)
Albumin: 1.6 g/dL — ABNORMAL LOW (ref 3.5–5.0)
Alkaline Phosphatase: 96 U/L (ref 38–126)
Anion gap: 7 (ref 5–15)
BUN: 20 mg/dL (ref 6–20)
CO2: 26 mmol/L (ref 22–32)
Calcium: 7.9 mg/dL — ABNORMAL LOW (ref 8.9–10.3)
Chloride: 103 mmol/L (ref 98–111)
Creatinine, Ser: 1.31 mg/dL — ABNORMAL HIGH (ref 0.61–1.24)
GFR, Estimated: >60 mL/min (ref >60)
Glucose, Bld: 95 mg/dL (ref 70–99)
Potassium: 3.6 mmol/L (ref 3.5–5.1)
Sodium: 136 mmol/L (ref 135–145)
Total Bilirubin: 4.5 mg/dL — ABNORMAL HIGH (ref 0.3–1.2)
Total Protein: 6.1 g/dL — ABNORMAL LOW (ref 6.5–8.1)

## 2021-10-03 LAB — CBC
HCT: 21.7 % — ABNORMAL LOW (ref 39.0–52.0)
Hemoglobin: 7.6 g/dL — ABNORMAL LOW (ref 13.0–17.0)
MCH: 33 pg (ref 26.0–34.0)
MCHC: 35 g/dL (ref 30.0–36.0)
MCV: 94.3 fL (ref 80.0–100.0)
Platelets: 99 10*3/uL — ABNORMAL LOW (ref 150–400)
RBC: 2.3 MIL/uL — ABNORMAL LOW (ref 4.22–5.81)
RDW: 14.7 % (ref 11.5–15.5)
WBC: 7.4 10*3/uL (ref 4.0–10.5)
nRBC: 0 % (ref 0.0–0.2)

## 2021-10-03 LAB — LIPASE, FLUID: Lipase-Fluid: 80 U/L

## 2021-10-03 LAB — MAGNESIUM: Magnesium: 1.5 mg/dL — ABNORMAL LOW (ref 1.7–2.4)

## 2021-10-03 LAB — EPSTEIN BARR VRS(EBV DNA BY PCR): EBV DNA QN by PCR: NEGATIVE IU/mL

## 2021-10-03 LAB — PROTIME-INR
INR: 2.7 — ABNORMAL HIGH (ref 0.8–1.2)
Prothrombin Time: 28.1 seconds — ABNORMAL HIGH (ref 11.4–15.2)

## 2021-10-03 LAB — CELIAC DISEASE PANEL
Endomysial Ab, IgA: NEGATIVE
IgA: 981 mg/dL — ABNORMAL HIGH (ref 90–386)
Tissue Transglutaminase Ab, IgA: <2 U/mL (ref 0–3)

## 2021-10-03 LAB — PHOSPHORUS: Phosphorus: 3.9 mg/dL (ref 2.5–4.6)

## 2021-10-03 MED ORDER — VITAMIN K1 10 MG/ML IJ SOLN
10.0000 mg | Freq: Every day | INTRAMUSCULAR | Status: DC
  Filled 2021-10-03: qty 1

## 2021-10-03 MED ORDER — MAGNESIUM SULFATE 2 GM/50ML IV SOLN
2.0000 g | Freq: Once | INTRAVENOUS | Status: AC
  Administered 2021-10-03: 2 g via INTRAVENOUS
  Filled 2021-10-03: qty 50

## 2021-10-03 MED ORDER — VITAMIN K1 10 MG/ML IJ SOLN
10.0000 mg | Freq: Every day | INTRAMUSCULAR | Status: DC
  Administered 2021-10-04: 10 mg via SUBCUTANEOUS
  Filled 2021-10-03: qty 1

## 2021-10-03 MED ORDER — ALBUMIN HUMAN 25 % IV SOLN
25.0000 g | Freq: Every day | INTRAVENOUS | Status: AC
  Administered 2021-10-04: 25 g via INTRAVENOUS
  Filled 2021-10-03: qty 100

## 2021-10-03 MED ORDER — SODIUM CHLORIDE 0.9 % IV SOLN
50.0000 ug/h | INTRAVENOUS | Status: DC
  Administered 2021-10-03 – 2021-10-04 (×2): 50 ug/h via INTRAVENOUS
  Filled 2021-10-03 (×4): qty 1

## 2021-10-03 MED ORDER — OCTREOTIDE LOAD VIA INFUSION
50.0000 ug | Freq: Once | INTRAVENOUS | Status: AC
  Administered 2021-10-03: 50 ug via INTRAVENOUS
  Filled 2021-10-03: qty 25

## 2021-10-03 NOTE — Progress Notes (Addendum)
PROGRESS NOTE    Andrew Cummings  ZOX:[id number] DOB: 06-22-1980 DOA: 10/01/2021 PCP: Farris Has, MD  Outpatient Specialists: none    Brief Narrative:   From admission h and p Andrew Cummings is a 41 y.o. male with medical history significant for Alcohol use disorder with complications of alcohol withdrawal seizures, alcoholic ketoacidosis and gastritis, thrombocytopenia secondary to alcohol use who presents to the ED with progressively worsening lower extremity edema and alcohol bloating, poor appetite and weakness.  He denies shortness of breath or leg pain or chest pain.  He denies abdominal pain ED course and data review: Vitals within normal limits.  Lab work with several abnormalities consistent with liver disease and also with renal disease including creatinine of 2.65, up from 0.55 about 8 months prior.  AST/ALT 156/52 with total bilirubin 6.8.  Albumin less than 1.5.  Sodium 132.  CBC notable for hemoglobin of 8.3, down from 13.7 about 8 months prior and platelets 112,000 up from 45,000 about the same time ago.  Lipase elevated at 102 and ammonia 164. Imaging, not done from the ED   Assessment & Plan:   Principal Problem:   Alcoholic liver disease (HCC) Active Problems:   Hepatorenal syndrome (HCC)   Thrombocytopenia (HCC)   Anasarca   Pancreatitis   Alcohol use disorder   History of seizure due to alcohol withdrawal   Anemia   AKI (acute kidney injury) (HCC)   Constipation   Alcoholic cirrhosis of liver with ascites (HCC)   Malnutrition of moderate degree   Decompensated hepatic cirrhosis (HCC)  # Alcohol liver disease, cirrhosis Hasn't had variceal screen. Does not appear encephalopathic. Hiv, hcv, hbv, rpr neg. No hcc on ruq u/s. Portal system patent. TOC consulted  # Anasarca 2/2 decompensated liver disease. No respiratory compromise. Recently started bumex (last week) - hold diuresis given aki - gi advises continuing albumin today is day 3  # AKI 2/2  liver dysfunction. Cr 2.65 from normal, improved to 1.31 today with albumin - albumin as above  # Ascites 2/2 portal htn, no sbp on 1.5 L paracentesis 9/14 - repeat para as needed  # Anemia Chronic disease likely contributing but needs eval for blood loss, hgb was 13s 8 months ago, now in the 7s and stable there. reports melena earlier this year, no bleeding since. Unknown if has varices - ppi, ceftriaxone, octreotide added - GI following, per Dr. Allegra Lai plan for EGD Monday  # Coagulopathy Inr 2s in setting of liver disease, possible bleeding.   - now s/p 3 day svitamin k  # Alcohol abuse Says last drink was 2 weeks ago, denies other substance use, no signs withdrawal today. Does have hx etoh withdrawal seizure - monitor on CIWA but appears out of window for withdrawal - vitamins  # Constipation Resolved with lactulose - cont lactulose    DVT prophylaxis: scds Code Status: full Family Communication: mother updated telephonically 9/16  Level of care: Med-Surg Status is: Inpatient Remains inpatient appropriate because: severity of illness    Consultants:  GI  Procedures: Paracentesis 9/14  Antimicrobials:  ceftriaxone    Subjective: No pain, breathing comfortably, swelling improved from yesterday subjectively  Objective: Vitals:   10/02/21 2200 10/03/21 0411 10/03/21 0918 10/03/21 1513  BP: 108/74 102/64 104/62 108/67  Pulse: 77 88 86 75  Resp:  16 18 20   Temp:  98.4 F (36.9 C) 98.2 F (36.8 C) 98 F (36.7 C)  TempSrc:  Oral Oral Oral  SpO2:  97% 98% 100%  Weight:      Height:        Intake/Output Summary (Last 24 hours) at 10/03/2021 1533 Last data filed at 10/03/2021 1037 Gross per 24 hour  Intake 1060 ml  Output --  Net 1060 ml   Filed Weights   09/30/21 2357  Weight: 91.2 kg    Examination:  General exam: Appears calm and comfortable  Respiratory system: Clear to auscultation. Respiratory effort normal. Cardiovascular system: S1 & S2  heard, RRR. No JVD, murmurs, rubs, gallops or clicks. No pedal edema. Gastrointestinal system: distended abdomen, non tender Central nervous system: Alert and oriented. No focal neurological deficits. Extremities: Symmetric 5 x 5 power. Severe pitting edema to abdomen Skin: No rashes, lesions or ulcers Psychiatry: Judgement and insight appear normal. Mood & affect appropriate.     Data Reviewed: I have personally reviewed following labs and imaging studies  CBC: Recent Labs  Lab 10/01/21 0003 10/01/21 0541 10/01/21 1330 10/02/21 0355 10/03/21 0449  WBC 9.1  --   --  8.6 7.4  NEUTROABS 5.3  --   --   --   --   HGB 8.3* 7.9* 8.7* 8.6* 7.6*  HCT 23.9*  --   --  24.7* 21.7*  MCV 93.7  --   --  93.6 94.3  PLT 112*  --   --  108* 99*   Basic Metabolic Panel: Recent Labs  Lab 10/01/21 0003 10/02/21 0355 10/03/21 0449  NA 132* 136 136  K 3.9 4.1 3.6  CL 100 105 103  CO2 26 25 26   GLUCOSE 114* 112* 95  BUN 28* 25* 20  CREATININE 2.65* 1.87* 1.31*  CALCIUM 8.0* 7.9* 7.9*  MG  --   --  1.5*  PHOS  --   --  3.9   GFR: Estimated Creatinine Clearance: 87.5 mL/min (A) (by C-G formula based on SCr of 1.31 mg/dL (H)). Liver Function Tests: Recent Labs  Lab 10/01/21 0003 10/02/21 0355 10/03/21 0449  AST 150* 125* 100*  ALT 52* 47* 38  ALKPHOS 112 116 96  BILITOT 6.8* 5.3* 4.5*  PROT 6.8 6.7 6.1*  ALBUMIN <1.5* 1.5* 1.6*   Recent Labs  Lab 10/01/21 0003  LIPASE 102*   Recent Labs  Lab 10/01/21 0003  AMMONIA 164*   Coagulation Profile: Recent Labs  Lab 10/01/21 0234 10/02/21 1152 10/03/21 0449  INR 2.5* 2.4* 2.7*   Cardiac Enzymes: No results for input(s): "CKTOTAL", "CKMB", "CKMBINDEX", "TROPONINI" in the last 168 hours. BNP (last 3 results) No results for input(s): "PROBNP" in the last 8760 hours. HbA1C: No results for input(s): "HGBA1C" in the last 72 hours. CBG: No results for input(s): "GLUCAP" in the last 168 hours. Lipid Profile: No results for  input(s): "CHOL", "HDL", "LDLCALC", "TRIG", "CHOLHDL", "LDLDIRECT" in the last 72 hours. Thyroid Function Tests: No results for input(s): "TSH", "T4TOTAL", "FREET4", "T3FREE", "THYROIDAB" in the last 72 hours. Anemia Panel: Recent Labs    10/01/21 1330  TIBC NOT CALCULATED  IRON 87   Urine analysis:    Component Value Date/Time   COLORURINE AMBER (A) 10/01/2021 0003   APPEARANCEUR HAZY (A) 10/01/2021 0003   LABSPEC 1.012 10/01/2021 0003   PHURINE 5.0 10/01/2021 0003   GLUCOSEU NEGATIVE 10/01/2021 0003   HGBUR NEGATIVE 10/01/2021 0003   BILIRUBINUR NEGATIVE 10/01/2021 0003   KETONESUR NEGATIVE 10/01/2021 0003   PROTEINUR NEGATIVE 10/01/2021 0003   NITRITE NEGATIVE 10/01/2021 0003   LEUKOCYTESUR NEGATIVE 10/01/2021 0003   Sepsis Labs: @LABRCNTIP (procalcitonin:4,lacticidven:4)  ) Recent Results (  from the past 240 hour(s))  Body fluid culture w Gram Stain     Status: None (Preliminary result)   Collection Time: 10/01/21  3:27 PM   Specimen: PATH Cytology Peritoneal fluid  Result Value Ref Range Status   Specimen Description   Final    CYTO PERI Performed at [facility], [address] [place], [place] [address]    Special Requests   Final    NONE Performed at [facility], [address] [place], [place] [address]    Gram Stain   Final    CYTOSPIN SMEAR WBC PRESENT, PREDOMINANTLY MONONUCLEAR NO ORGANISMS SEEN    Culture   Final    NO GROWTH 2 DAYS Performed at [facility], [address], [place], [place] [address]    Report Status PENDING  Incomplete         Radiology Studies: US Paracentesis  Result Date: 10/01/2021 INDICATION: Patient with acute liver failure and alcoholic liver cirrhosis with ascites request received for diagnostic and therapeutic paracentesis. EXAM: ULTRASOUND GUIDED  PARACENTESIS MEDICATIONS: Local 1% lidocaine only. COMPLICATIONS: None immediate. PROCEDURE: Informed written consent was obtained from the  patient after a discussion of the risks, benefits and alternatives to treatment. A timeout was performed prior to the initiation of the procedure. Initial ultrasound scanning demonstrates a small to moderate amount of ascites within the right upper abdominal quadrant. The right upper abdomen was prepped and draped in the usual sterile fashion. 1% lidocaine was used for local anesthesia. Following this, a 19 gauge, 7-cm, Yueh catheter was introduced. An ultrasound image was saved for documentation purposes. The paracentesis was performed. The catheter was removed and a dressing was applied. The patient tolerated the procedure well without immediate post procedural complication. FINDINGS: A total of approximately 1.5 L of clear yellow fluid was removed. Samples were sent to the laboratory as requested by the clinical team. IMPRESSION: Successful ultrasound-guided paracentesis yielding 1.5 liters of peritoneal fluid. PLAN: If the patient eventually requires >/=2 paracenteses in a 30 day period, candidacy for formal evaluation by the [facility] Interventional Radiology Portal Hypertension Clinic will be assessed. This exam was performed by Pattricia Boss PA-C, and was supervised and interpreted by Dr. Lowella Dandy. Electronically Signed   By: Richarda Overlie M.D.   On: 10/01/2021 16:49        Scheduled Meds:  (feeding supplement) PROSource Plus  30 mL Oral BID BM   feeding supplement  237 mL Oral BID BM   feeding supplement ([employer] STANDARD 1.4)  325 mL Oral Daily   folic acid  1 mg Oral Daily   lactulose  10 g Oral BID   multivitamin with minerals  1 tablet Oral Daily   pantoprazole (PROTONIX) IV  40 mg Intravenous Q12H   [START ON 10/04/2021] phytonadione  10 mg Subcutaneous Daily   thiamine  100 mg Oral Daily   Or   thiamine  100 mg Intravenous Daily   Continuous Infusions:  [START ON 10/04/2021] albumin human     cefTRIAXone (ROCEPHIN)  IV 2 g (10/03/21 1036)   octreotide (SANDOSTATIN) 500 mcg in sodium  chloride 0.9 % 250 mL (2 mcg/mL) infusion 50 mcg/hr (10/03/21 1138)     LOS: 2 days     Silvano Bilis, MD [facility]   If 7PM-7AM, please contact night-coverage [email or web address] Password [facility] 10/03/2021, 3:33 PM

## 2021-10-03 NOTE — Progress Notes (Signed)
End of shift note: 0700-1900  Pt was started on IV Sandostatin and IV albumin. VSS. Leg edema is improving. VSS

## 2021-10-03 NOTE — Progress Notes (Signed)
Andrew Darby, MD [address]  [place], [place] [address]  Main: [phone]  Fax: [phone] Pager: [phone]   Subjective: Patient is currently being managed for AKI and decompensated alcoholic cirrhosis of liver No acute events overnight.  Patient reports that his swelling of legs is improving and he feels much lighter today.  He reports having bowel movements on lactulose and they are brown in color. He denies any fever, nausea, vomiting, abdominal pain  Objective: Vital signs in last 24 hours: Vitals:   10/02/21 1545 10/02/21 2200 10/03/21 0411 10/03/21 0918  BP: 106/75 108/74 102/64 104/62  Pulse: 78 77 88 86  Resp: 18  16 18   Temp: 98.3 F (36.8 C)  98.4 F (36.9 C) 98.2 F (36.8 C)  TempSrc:   Oral Oral  SpO2: 100%  97% 98%  Weight:      Height:       Weight change:   Intake/Output Summary (Last 24 hours) at 10/03/2021 1348 Last data filed at 10/03/2021 1037 Gross per 24 hour  Intake 1300 ml  Output --  Net 1300 ml     Exam: Heart:: Regular rate and rhythm, S1S2 present, or without murmur or extra heart sounds Lungs: normal and clear to auscultation Abdomen: Soft, distended, dull to percussion, nontender   Lab Results:    Latest Ref Rng & Units 10/03/2021    4:49 AM 10/02/2021    3:55 AM 10/01/2021    1:30 PM  CBC  WBC 4.0 - 10.5 K/uL 7.4  8.6    Hemoglobin 13.0 - 17.0 g/dL 7.6  8.6  8.7   Hematocrit 39.0 - 52.0 % 21.7  24.7    Platelets 150 - 400 K/uL 99  108        Latest Ref Rng & Units 10/03/2021    4:49 AM 10/02/2021    3:55 AM 10/01/2021   12:03 AM  CMP  Glucose 70 - 99 mg/dL 95  112  114   BUN 6 - 20 mg/dL 20  25  28    Creatinine 0.61 - 1.24 mg/dL 1.31  1.87  2.65   Sodium 135 - 145 mmol/L 136  136  132   Potassium 3.5 - 5.1 mmol/L 3.6  4.1  3.9   Chloride 98 - 111 mmol/L 103  105  100   CO2 22 - 32 mmol/L 26  25  26    Calcium 8.9 - 10.3 mg/dL 7.9  7.9  8.0   Total Protein 6.5 - 8.1 g/dL 6.1  6.7  6.8    Total Bilirubin 0.3 - 1.2 mg/dL 4.5  5.3  6.8   Alkaline Phos 38 - 126 U/L 96  116  112   AST 15 - 41 U/L 100  125  150   ALT 0 - 44 U/L 38  47  52     Micro Results: Recent Results (from the past 240 hour(s))  Body fluid culture w Gram Stain     Status: None (Preliminary result)   Collection Time: 10/01/21  3:27 PM   Specimen: PATH Cytology Peritoneal fluid  Result Value Ref Range Status   Specimen Description   Final    CYTO PERI Performed at [facility], [address], [place], [place] [address]    Special Requests   Final    NONE Performed at [facility], [address] [place], [place] [address]    Gram Stain   Final    CYTOSPIN  SMEAR WBC PRESENT, PREDOMINANTLY MONONUCLEAR NO ORGANISMS SEEN    Culture   Final    NO GROWTH 2 DAYS Performed at [facility], [place] [address] [place], [place] [address]    Report Status PENDING  Incomplete   Studies/Results: US Paracentesis  Result Date: 10/01/2021 INDICATION: Patient with acute liver failure and alcoholic liver cirrhosis with ascites request received for diagnostic and therapeutic paracentesis. EXAM: ULTRASOUND GUIDED  PARACENTESIS MEDICATIONS: Local 1% lidocaine only. COMPLICATIONS: None immediate. PROCEDURE: Informed written consent was obtained from the patient after a discussion of the risks, benefits and alternatives to treatment. A timeout was performed prior to the initiation of the procedure. Initial ultrasound scanning demonstrates a small to moderate amount of ascites within the right upper abdominal quadrant. The right upper abdomen was prepped and draped in the usual sterile fashion. 1% lidocaine was used for local anesthesia. Following this, a 19 gauge, 7-cm, Yueh catheter was introduced. An ultrasound image was saved for documentation purposes. The paracentesis was performed. The catheter was removed and a dressing was applied. The patient tolerated the procedure well without immediate  post procedural complication. FINDINGS: A total of approximately 1.5 L of clear yellow fluid was removed. Samples were sent to the laboratory as requested by the clinical team. IMPRESSION: Successful ultrasound-guided paracentesis yielding 1.5 liters of peritoneal fluid. PLAN: If the patient eventually requires >/=2 paracenteses in a 30 day period, candidacy for formal evaluation by the [facility] will be assessed. This exam was performed by Tsosie Billing PA-C, and was supervised and interpreted by Dr. Anselm Pancoast. Electronically Signed   By: Markus Daft M.D.   On: 10/01/2021 16:49   US LIVER DOPPLER  Result Date: 10/01/2021 CLINICAL DATA:  Hepatic failure. EXAM: DUPLEX ULTRASOUND OF LIVER TECHNIQUE: Color and duplex Doppler ultrasound was performed to evaluate the hepatic in-flow and out-flow vessels. COMPARISON:  Right upper quadrant ultrasound earlier today. FINDINGS: Portal Vein Velocities Main:  26-43 cm/sec Right:  25 cm/sec Left:  14 cm/sec Hepatic Vein Velocities Right:  42 cm/sec Middle:  30 cm/sec Left:  40 cm/sec Hepatic Artery Velocity:  127 cm/sec Splenic Vein Velocity:  17 cm/sec Varices: None visualized. Ascites: Ascites present in the visualized peritoneal cavity. Splenomegaly with estimated splenic volume of at least 450 mL. Recanalized umbilical vein implicating underlying portal hypertension. Portal vein waveforms demonstrate to and fro flow. No evidence of portal vein thrombus. Hepatic vein waveforms are somewhat pulsatile. This can be seen in the setting right heart failure but also in the setting of cirrhosis. Liver morphology is consistent with cirrhosis. IMPRESSION: 1. Evidence of portal hypertension with to and fro flow in the portal vein, recanalization of the umbilical vein and splenomegaly. No evidence of portal vein thrombosis. 2. Ascites in the visualized peritoneal cavity. 3. Cirrhosis of the liver. Electronically Signed   By: Aletta Edouard M.D.   On: 10/01/2021 16:00   Medications: I have reviewed the patient's current medications. Prior to Admission:  Medications Prior to Admission  Medication Sig Dispense Refill Last Dose   bumetanide (BUMEX) 1 MG tablet Take 1 mg by mouth daily.   Past Week   FLUoxetine (PROZAC) 10 MG capsule Take 10 mg by mouth daily. (Patient not taking: Reported on 10/01/2021)   Not Taking   folic acid (FOLVITE) 1 MG tablet Take 1 tablet (1 mg total) by mouth daily. (Patient not taking: Reported on 10/01/2021) 30 tablet 0 Not Taking   Multiple Vitamin (MULTIVITAMIN WITH MINERALS)  TABS tablet Take 1 tablet by mouth daily. (Patient not taking: Reported on 10/01/2021) 30 tablet 0 Not Taking   traZODone (DESYREL) 50 MG tablet Take 1 tablet (50 mg total) by mouth at bedtime. (Patient not taking: Reported on 10/01/2021) 30 tablet 0 Not Taking   Scheduled:  (feeding supplement) PROSource Plus  30 mL Oral BID BM   feeding supplement  237 mL Oral BID BM   feeding supplement (KATE FARMS STANDARD 1.4)  325 mL Oral Daily   folic acid  1 mg Oral Daily   lactulose  10 g Oral BID   multivitamin with minerals  1 tablet Oral Daily   octreotide  50 mcg Intravenous Once   pantoprazole (PROTONIX) IV  40 mg Intravenous Q12H   phytonadione  10 mg Subcutaneous Daily   thiamine  100 mg Oral Daily   Or   thiamine  100 mg Intravenous Daily   Continuous:  albumin human     cefTRIAXone (ROCEPHIN)  IV 2 g (10/03/21 1036)   magnesium sulfate bolus IVPB     octreotide (SANDOSTATIN) 500 mcg in sodium chloride 0.9 % 250 mL (2 mcg/mL) infusion 50 mcg/hr (10/03/21 1138)   [id number] **OR** acetaminophen, LORazepam **OR** LORazepam, ondansetron **OR** ondansetron (ZOFRAN) IV Anti-infectives (From admission, onward)    Start     Dose/Rate Route Frequency Ordered Stop   10/02/21 1200  cefTRIAXone (ROCEPHIN) 2 g in sodium chloride 0.9 % 100 mL IVPB        2 g 200 mL/hr over 30 Minutes Intravenous Every 24 hours  10/02/21 0835     10/01/21 1300  cefTRIAXone (ROCEPHIN) 1 g in sodium chloride 0.9 % 100 mL IVPB  Status:  Discontinued        1 g 200 mL/hr over 30 Minutes Intravenous Every 24 hours 10/01/21 1151 10/02/21 0835      Scheduled Meds:  (feeding supplement) PROSource Plus  30 mL Oral BID BM   feeding supplement  237 mL Oral BID BM   feeding supplement (KATE FARMS STANDARD 1.4)  325 mL Oral Daily   folic acid  1 mg Oral Daily   lactulose  10 g Oral BID   multivitamin with minerals  1 tablet Oral Daily   octreotide  50 mcg Intravenous Once   pantoprazole (PROTONIX) IV  40 mg Intravenous Q12H   phytonadione  10 mg Subcutaneous Daily   thiamine  100 mg Oral Daily   Or   thiamine  100 mg Intravenous Daily   Continuous Infusions:  albumin human     cefTRIAXone (ROCEPHIN)  IV 2 g (10/03/21 1036)   magnesium sulfate bolus IVPB     octreotide (SANDOSTATIN) 500 mcg in sodium chloride 0.9 % 250 mL (2 mcg/mL) infusion 50 mcg/hr (10/03/21 1138)   PRN Meds:.acetaminophen **OR** acetaminophen, LORazepam **OR** LORazepam, ondansetron **OR** ondansetron (ZOFRAN) IV   Assessment: Principal Problem:   Alcoholic liver disease ([place]) Active Problems:   Thrombocytopenia ([place])   Anasarca   Hepatorenal syndrome ([place])   Pancreatitis   Alcohol use disorder   History of seizure due to alcohol withdrawal   Anemia   AKI (acute kidney injury) ([place])   Constipation   Ascites due to alcoholic cirrhosis ([place])   Malnutrition of moderate degree Andrew Cummings is a 41 y.o. male with history of alcohol abuse is admitted with decompensated alcoholic cirrhosis of liver manifested with jaundice, ascites and AKI.    Plan: AKI: Improving with volume resuscitation Continue albumin infusion, avoid diuretics, octreotide will  also help with splanchnic circulation and improving renal perfusion Monitor urine output and renal function, electrolytes daily  Ascites S/p therapeutic paracentesis on 9/14, 1.5 clear  yellow fluid removed, no evidence of SBP, SAAG consistent with portal hypertension Ultrasound Doppler revealed patent portal vein, no evidence of portal vein thrombosis If needed, recommend another therapeutic paracentesis  Anemia, thrombocytopenia, coagulopathy secondary to decompensated alcoholic cirrhosis and ALF No evidence of active GI bleed Okay to continue octreotide for now Patient will need EGD closer to discharge or as outpatient Administer vitamin K 10 mg subcutaneously daily for 3 days  New onset of decompensated alcoholic cirrhosis: MELD sodium 27 Continue lactulose 15 mL twice daily Continue thiamine plus multivitamin plus folic acid daily CIWA protocol  Patient should follow-up with Dr. Vicente Males upon discharge   LOS: 2 days   Daenerys Buttram 10/03/2021, 1:48 PM

## 2021-10-03 NOTE — Plan of Care (Signed)
  Problem: Education: Goal: Knowledge of General Education information will improve Description: Including pain rating scale, medication(s)/side effects and non-pharmacologic comfort measures Outcome: Progressing   Problem: Activity: Goal: Risk for activity intolerance will decrease Outcome: Progressing   Problem: Nutrition: Goal: Adequate nutrition will be maintained Outcome: Progressing   Problem: Coping: Goal: Level of anxiety will decrease Outcome: Progressing   Problem: Elimination: Goal: Will not experience complications related to bowel motility Outcome: Progressing   Problem: Pain Managment: Goal: General experience of comfort will improve Outcome: Progressing   Problem: Safety: Goal: Ability to remain free from injury will improve Outcome: Progressing   Problem: Skin Integrity: Goal: Risk for impaired skin integrity will decrease Outcome: Progressing   Problem: Education: Goal: Knowledge of General Education information will improve Description: Including pain rating scale, medication(s)/side effects and non-pharmacologic comfort measures Outcome: Progressing   Problem: Activity: Goal: Risk for activity intolerance will decrease Outcome: Progressing   Problem: Coping: Goal: Level of anxiety will decrease Outcome: Progressing   Problem: Nutrition: Goal: Adequate nutrition will be maintained Outcome: Progressing

## 2021-10-04 DIAGNOSIS — K7031 Alcoholic cirrhosis of liver with ascites: Secondary | ICD-10-CM | POA: Diagnosis not present

## 2021-10-04 DIAGNOSIS — K709 Alcoholic liver disease, unspecified: Secondary | ICD-10-CM | POA: Diagnosis not present

## 2021-10-04 DIAGNOSIS — N179 Acute kidney failure, unspecified: Secondary | ICD-10-CM | POA: Diagnosis not present

## 2021-10-04 DIAGNOSIS — K7460 Unspecified cirrhosis of liver: Secondary | ICD-10-CM | POA: Diagnosis not present

## 2021-10-04 DIAGNOSIS — K7290 Hepatic failure, unspecified without coma: Secondary | ICD-10-CM | POA: Diagnosis not present

## 2021-10-04 LAB — COMPREHENSIVE METABOLIC PANEL
ALT: 46 U/L — ABNORMAL HIGH (ref 0–44)
AST: 124 U/L — ABNORMAL HIGH (ref 15–41)
Albumin: 1.6 g/dL — ABNORMAL LOW (ref 3.5–5.0)
Alkaline Phosphatase: 91 U/L (ref 38–126)
Anion gap: 4 — ABNORMAL LOW (ref 5–15)
BUN: 14 mg/dL (ref 6–20)
CO2: 25 mmol/L (ref 22–32)
Calcium: 7.7 mg/dL — ABNORMAL LOW (ref 8.9–10.3)
Chloride: 104 mmol/L (ref 98–111)
Creatinine, Ser: 1.04 mg/dL (ref 0.61–1.24)
GFR, Estimated: >60 mL/min (ref >60)
Glucose, Bld: 114 mg/dL — ABNORMAL HIGH (ref 70–99)
Potassium: 3.9 mmol/L (ref 3.5–5.1)
Sodium: 133 mmol/L — ABNORMAL LOW (ref 135–145)
Total Bilirubin: 4.9 mg/dL — ABNORMAL HIGH (ref 0.3–1.2)
Total Protein: 5.7 g/dL — ABNORMAL LOW (ref 6.5–8.1)

## 2021-10-04 LAB — CBC
HCT: 22.1 % — ABNORMAL LOW (ref 39.0–52.0)
Hemoglobin: 7.5 g/dL — ABNORMAL LOW (ref 13.0–17.0)
MCH: 32.2 pg (ref 26.0–34.0)
MCHC: 33.9 g/dL (ref 30.0–36.0)
MCV: 94.8 fL (ref 80.0–100.0)
Platelets: 107 10*3/uL — ABNORMAL LOW (ref 150–400)
RBC: 2.33 MIL/uL — ABNORMAL LOW (ref 4.22–5.81)
RDW: 14.9 % (ref 11.5–15.5)
WBC: 6.6 10*3/uL (ref 4.0–10.5)
nRBC: 0 % (ref 0.0–0.2)

## 2021-10-04 LAB — PROTIME-INR
INR: 2.8 — ABNORMAL HIGH (ref 0.8–1.2)
Prothrombin Time: 28.9 seconds — ABNORMAL HIGH (ref 11.4–15.2)

## 2021-10-04 LAB — VARICELLA-ZOSTER BY PCR: Varicella-Zoster, PCR: NEGATIVE

## 2021-10-04 MED ORDER — FUROSEMIDE 10 MG/ML IJ SOLN: 20.0000 mg | Freq: Once | INTRAMUSCULAR | Status: DC

## 2021-10-04 MED ORDER — SPIRONOLACTONE 25 MG PO TABS: 50.0000 mg | ORAL_TABLET | Freq: Every day | ORAL | Status: DC

## 2021-10-04 MED ORDER — FUROSEMIDE 10 MG/ML IJ SOLN
20.0000 mg | Freq: Two times a day (BID) | INTRAMUSCULAR | Status: DC
  Administered 2021-10-04: 20 mg via INTRAVENOUS
  Filled 2021-10-04: qty 4

## 2021-10-04 MED ORDER — SPIRONOLACTONE 25 MG PO TABS
100.0000 mg | ORAL_TABLET | Freq: Every day | ORAL | Status: DC
  Administered 2021-10-04 – 2021-10-05 (×2): 100 mg via ORAL
  Filled 2021-10-04 (×2): qty 4

## 2021-10-04 MED ORDER — FUROSEMIDE 40 MG PO TABS
40.0000 mg | ORAL_TABLET | Freq: Every day | ORAL | Status: DC
  Administered 2021-10-04 – 2021-10-05 (×2): 40 mg via ORAL
  Filled 2021-10-04 (×2): qty 1

## 2021-10-04 NOTE — Progress Notes (Signed)
Andrew Repress, MD [address]  [address]  [place], [place] [address]  Main: [phone]  Fax: [phone] Pager: [phone]   Subjective: No acute events overnight.  Patient reports that his swelling of legs has worsened compared to yesterday.  He reports having bowel movements on lactulose and they are brown in color. He denies any fever, nausea, vomiting, abdominal pain.  His mom is bedside  Objective: Vital signs in last 24 hours: Vitals:   10/03/21 1513 10/03/21 2040 10/04/21 0506 10/04/21 0741  BP: 108/67 106/63 (!) 111/57 102/60  Pulse: 75 77 76 79  Resp: 20 20 20 18   Temp: 98 F (36.7 C) 98.2 F (36.8 C) 98.1 F (36.7 C) 98.7 F (37.1 C)  TempSrc: Oral Oral Oral Oral  SpO2: 100% 100% 99% 97%  Weight:      Height:       Weight change:   Intake/Output Summary (Last 24 hours) at 10/04/2021 1315 Last data filed at 10/04/2021 0507 Gross per 24 hour  Intake 401.35 ml  Output --  Net 401.35 ml     Exam: Heart:: Regular rate and rhythm, S1S2 present, or without murmur or extra heart sounds Lungs: normal and clear to auscultation Abdomen: Soft, distended, dull to percussion, nontender   Lab Results:    Latest Ref Rng & Units 10/04/2021    5:44 AM 10/03/2021    4:49 AM 10/02/2021    3:55 AM  CBC  WBC 4.0 - 10.5 K/uL 6.6  7.4  8.6   Hemoglobin 13.0 - 17.0 g/dL 7.5  7.6  8.6   Hematocrit 39.0 - 52.0 % 22.1  21.7  24.7   Platelets 150 - 400 K/uL 107  99  108       Latest Ref Rng & Units 10/04/2021    5:44 AM 10/03/2021    4:49 AM 10/02/2021    3:55 AM  CMP  Glucose 70 - 99 mg/dL 10/04/2021  95  814   BUN 6 - 20 mg/dL 14  20  25    Creatinine 0.61 - 1.24 mg/dL 481   8.56   Sodium 135 - 145 mmol/L 133  136  136   Potassium 3.5 - 5.1 mmol/L 3.9  3.6  4.1   Chloride 98 - 111 mmol/L 104  103  105   CO2 22 - 32 mmol/L 25  26  25    Calcium 8.9 - 10.3 mg/dL 7.7  7.9  7.9   Total Protein 6.5 - 8.1 g/dL 5.7  6.1  6.7   Total Bilirubin 0.3 - 1.2 mg/dL  4.9  4.5  5.3   Alkaline Phos 38 - 126 U/L 91  96  116   AST 15 - 41 U/L 124  100  125   ALT 0 - 44 U/L 46  38  47     Micro Results: Recent Results (from the past 240 hour(s))  Varicella-zoster by PCR     Status: None   Collection Time: 10/01/21  1:30 PM   Specimen: Blood  Result Value Ref Range Status   Varicella-Zoster, PCR Negative Negative Final    Comment: (NOTE) No Varicella Zoster Virus DNA detected. This test was developed and its performance characteristics determined by [facility].  It has not been cleared or approved by the Food and Drug Administration.  The FDA has determined that such clearance or approval is not necessary. Performed At: [facility] [address] [place], [facility] [address] [place]  Body fluid culture w Gram Stain     Status: None (Preliminary result)   Collection Time: 10/01/21  3:27 PM   Specimen: PATH Cytology Peritoneal fluid  Result Value Ref Range Status   Specimen Description   Final    CYTO PERI Performed at [facility], [address] [place], [place] [address]    Special Requests   Final    NONE Performed at [facility], [address] [place], [place] [address]    Gram Stain   Final    CYTOSPIN SMEAR WBC PRESENT, PREDOMINANTLY MONONUCLEAR NO ORGANISMS SEEN    Culture   Final    NO GROWTH 3 DAYS Performed at [facility], [address] [place], [place] [address]    Report Status PENDING  Incomplete   Studies/Results: No results found. Medications: I have reviewed the patient's current medications. Prior to Admission:  Medications Prior to Admission  Medication Sig Dispense Refill Last Dose   bumetanide (BUMEX) 1 MG tablet Take 1 mg by mouth daily.   Past Week   FLUoxetine (PROZAC) 10 MG capsule Take 10 mg by mouth daily. (Patient not taking: Reported on 10/01/2021)   Not Taking   folic acid (FOLVITE) 1 MG tablet Take 1 tablet (1 mg total) by mouth daily.  (Patient not taking: Reported on 10/01/2021) 30 tablet 0 Not Taking   Multiple Vitamin (MULTIVITAMIN WITH MINERALS) TABS tablet Take 1 tablet by mouth daily. (Patient not taking: Reported on 10/01/2021) 30 tablet 0 Not Taking   traZODone (DESYREL) 50 MG tablet Take 1 tablet (50 mg total) by mouth at bedtime. (Patient not taking: Reported on 10/01/2021) 30 tablet 0 Not Taking   Scheduled:  (feeding supplement) PROSource Plus  30 mL Oral BID BM   feeding supplement  237 mL Oral BID BM   feeding supplement (KATE FARMS STANDARD 1.4)  325 mL Oral Daily   folic acid  1 mg Oral Daily   furosemide  20 mg Intravenous Q12H   lactulose  10 g Oral BID   multivitamin with minerals  1 tablet Oral Daily   pantoprazole (PROTONIX) IV  40 mg Intravenous Q12H   spironolactone  50 mg Oral Daily   thiamine  100 mg Oral Daily   Or   thiamine  100 mg Intravenous Daily   Continuous:  cefTRIAXone (ROCEPHIN)  IV 2 g (10/04/21 1020)   octreotide (SANDOSTATIN) 500 mcg in sodium chloride 0.9 % 250 mL (2 mcg/mL) infusion 50 mcg/hr (10/04/21 0848)   [id number] **OR** acetaminophen, ondansetron **OR** ondansetron (ZOFRAN) IV Anti-infectives (From admission, onward)    Start     Dose/Rate Route Frequency Ordered Stop   10/02/21 1200  cefTRIAXone (ROCEPHIN) 2 g in sodium chloride 0.9 % 100 mL IVPB        2 g 200 mL/hr over 30 Minutes Intravenous Every 24 hours 10/02/21 0835     10/01/21 1300  cefTRIAXone (ROCEPHIN) 1 g in sodium chloride 0.9 % 100 mL IVPB  Status:  Discontinued        1 g 200 mL/hr over 30 Minutes Intravenous Every 24 hours 10/01/21 1151 10/02/21 0835      Scheduled Meds:  (feeding supplement) PROSource Plus  30 mL Oral BID BM   feeding supplement  237 mL Oral BID BM   feeding supplement (KATE FARMS STANDARD 1.4)  325 mL Oral Daily   folic acid  1 mg Oral Daily   furosemide  20 mg Intravenous Q12H  lactulose  10 g Oral BID   multivitamin with minerals  1 tablet Oral Daily    pantoprazole (PROTONIX) IV  40 mg Intravenous Q12H   spironolactone  50 mg Oral Daily   thiamine  100 mg Oral Daily   Or   thiamine  100 mg Intravenous Daily   Continuous Infusions:  cefTRIAXone (ROCEPHIN)  IV 2 g (10/04/21 1020)   octreotide (SANDOSTATIN) 500 mcg in sodium chloride 0.9 % 250 mL (2 mcg/mL) infusion 50 mcg/hr (10/04/21 0848)   PRN Meds:.acetaminophen **OR** acetaminophen, ondansetron **OR** ondansetron (ZOFRAN) IV   Assessment: Principal Problem:   Alcoholic liver disease ([facility]) Active Problems:   Thrombocytopenia ([place])   Anasarca   Hepatorenal syndrome ([place])   Pancreatitis   Alcohol use disorder   History of seizure due to alcohol withdrawal   Anemia   AKI (acute kidney injury) ([place])   Constipation   Alcoholic cirrhosis of liver with ascites ([facility])   Malnutrition of moderate degree   Decompensated hepatic cirrhosis ([place]) Andrew Cummings is a 41 y.o. male with history of alcohol abuse is admitted with decompensated alcoholic cirrhosis of liver manifested with jaundice, ascites and AKI.    Plan: AKI: Resolved with volume resuscitation Okay to stop albumin infusion Monitor urine output and renal function, electrolytes daily  Ascites/volume overload S/p therapeutic paracentesis on 9/14, 1.5 clear yellow fluid removed, no evidence of SBP, SAAG consistent with portal hypertension Ultrasound Doppler revealed patent portal vein, no evidence of portal vein thrombosis Attempt another therapeutic paracentesis today, ordered We will give diuretic challenge today, IV Lasix 20 mg twice daily for 1 day today, started spironolactone 50 mg daily Recheck BMP at 5 PM today, if renal function is stable, recommend to transition to Lasix 40 mg daily and spironolactone 100 mg daily from tomorrow Strict low-sodium diet  Anemia, thrombocytopenia, coagulopathy secondary to decompensated alcoholic cirrhosis and ALF No evidence of active GI bleed Okay to continue octreotide for  now Patient is not actively bleeding at this time, I am more inclined to perform EGD as outpatient once his volume status improves and patient is agreeable S/p vitamin K 10 mg subcutaneously daily for 3 days  New onset of decompensated alcoholic cirrhosis: MELD sodium 27 Continue lactulose 15 mL twice daily, reduce the dose for more than 2-3 soft bowel movements daily Continue thiamine plus multivitamin plus folic acid daily CIWA protocol Discussed with patient to maintain complete abstinence from alcohol use, otherwise his prognosis will be guarded and will not be a liver transplant candidate.  At that time, palliative care would be appropriate  Patient should follow-up with Dr. Vicente Males upon discharge   LOS: 3 days   Andrew Cummings 10/04/2021, 1:15 PM

## 2021-10-04 NOTE — Progress Notes (Signed)
NUTRITION NOTE RD working remotely.   Consult received for patient with decompensated liver cirrhosis with need for low-sodium diet.  Current diet order: 2 gram Na with 100% completion of meals on 9/15 and breakfast on 9/16; no meal intakes documented since breakfast on 9/16.  Patient was seen in person by another RD on 9/15 at which time patient was educated on low sodium, high protein diet and handouts provided to patient. Oral nutrition supplements in line with this diet were ordered on that date. Patient was found to meet criteria for non-severe (moderate) malnutrition in the context of chronic illness as evidenced by moderate fat and moderate muscle depletions.  Will place Low Sodium Nutrition Therapy handout from the [employer] of [employer] in AVS.   GI MD note from today reviewed. Noted that discussion had about complete abstinence from alcohol otherwise patient will not be a candidate for liver transplant and prognosis will become guarded.     Jarome Matin, MS, RD, LDN, CNSC Registered Dietitian II Inpatient Clinical Nutrition On-call/weekend pager # available in [facility]

## 2021-10-04 NOTE — Progress Notes (Signed)
PROGRESS NOTE    Andrew Cummings  TZG:[id number] DOB: [birth year] DOA: 10/01/2021 PCP: Farris Has, MD  Outpatient Specialists: none    Brief Narrative:   From admission h and p Andrew Cummings is a 41 y.o. male with medical history significant for Alcohol use disorder with complications of alcohol withdrawal seizures, alcoholic ketoacidosis and gastritis, thrombocytopenia secondary to alcohol use who presents to the ED with progressively worsening lower extremity edema and alcohol bloating, poor appetite and weakness.  He denies shortness of breath or leg pain or chest pain.  He denies abdominal pain ED course and data review: Vitals within normal limits.  Lab work with several abnormalities consistent with liver disease and also with renal disease including creatinine of 2.65, up from 0.55 about 8 months prior.  AST/ALT 156/52 with total bilirubin 6.8.  Albumin less than 1.5.  Sodium 132.  CBC notable for hemoglobin of 8.3, down from 13.7 about 8 months prior and platelets 112,000 up from 45,000 about the same time ago.  Lipase elevated at 102 and ammonia 164. Imaging, not done from the ED   Assessment & Plan:   Principal Problem:   Alcoholic liver disease (HCC) Active Problems:   Hepatorenal syndrome (HCC)   Thrombocytopenia (HCC)   Anasarca   Pancreatitis   Alcohol use disorder   History of seizure due to alcohol withdrawal   Anemia   AKI (acute kidney injury) (HCC)   Constipation   Alcoholic cirrhosis of liver with ascites (HCC)   Malnutrition of moderate degree   Decompensated hepatic cirrhosis (HCC)  # Alcohol liver disease, cirrhosis Hasn't had variceal screen. Does not appear encephalopathic. Hiv, hcv, hbv, rpr neg. No hcc on ruq u/s. Portal system patent. TOC consulted  # Anasarca 2/2 decompensated liver disease. No respiratory compromise. Recently started bumex (last week). Received IV albumin for 3 days - diuresis as below  # AKI 2/2 liver dysfunction. Cr  2.65 from normal, now normalized with albumin  # Ascites 2/2 portal htn, no sbp on 1.5 L paracentesis 9/14 - start diuresis today with lasix 40, spiro 100. Also received IV lasix x1 ordered by GI - repeat para ordered by GI - strict I/Os  # Anemia Chronic disease likely contributing but needs eval for blood loss, hgb was 13s 8 months ago, now in the 7s and stable there. reports melena earlier this year, no bleeding since. Unknown if has varices - given stable hgb and no clinical signs of bleeding will stop ppi, octreotide, ceftriaxone - GI following, TBD whether inpt vs outpt EGD  # Coagulopathy Inr 2s in setting of liver disease, possible bleeding. now s/p 3 day svitamin k  # Alcohol abuse Says last drink was 2 weeks ago, denies other substance use, no signs withdrawal since admit. Does have hx etoh withdrawal seizure - vitamins  # Constipation Resolved with lactulose - cont lactulose    DVT prophylaxis: scds Code Status: full Family Communication: mother updated telephonically 9/16  Level of care: Med-Surg Status is: Inpatient Remains inpatient appropriate because: severity of illness    Consultants:  GI  Procedures: Paracentesis 9/14  Antimicrobials:  ceftriaxone    Subjective: No pain, breathing comfortably, swelling stable  Objective: Vitals:   10/03/21 1513 10/03/21 2040 10/04/21 0506 10/04/21 0741  BP: 108/67 106/63 (!) 111/57 102/60  Pulse: 75 77 76 79  Resp: 20 20 20 18   Temp: 98 F (36.7 C) 98.2 F (36.8 C) 98.1 F (36.7 C) 98.7 F (37.1 C)  TempSrc: Oral Oral  Oral Oral  SpO2: 100% 100% 99% 97%  Weight:      Height:        Intake/Output Summary (Last 24 hours) at 10/04/2021 1401 Last data filed at 10/04/2021 1336 Gross per 24 hour  Intake 401.35 ml  Output 450 ml  Net -48.65 ml   Filed Weights   09/30/21 2357  Weight: 91.2 kg    Examination:  General exam: Appears calm and comfortable  Respiratory system: Clear to auscultation.  Respiratory effort normal. Cardiovascular system: S1 & S2 heard, RRR. No JVD, murmurs, rubs, gallops or clicks. No pedal edema. Gastrointestinal system: distended abdomen, non tender Central nervous system: Alert and oriented. No focal neurological deficits. Extremities: Symmetric 5 x 5 power. Severe pitting edema to abdomen Skin: No rashes, lesions or ulcers Psychiatry: Judgement and insight appear normal. Mood & affect appropriate.     Data Reviewed: I have personally reviewed following labs and imaging studies  CBC: Recent Labs  Lab 10/01/21 0003 10/01/21 0541 10/01/21 1330 10/02/21 0355 10/03/21 0449 10/04/21 0544  WBC 9.1  --   --  8.6 7.4 6.6  NEUTROABS 5.3  --   --   --   --   --   HGB 8.3* 7.9* 8.7* 8.6* 7.6* 7.5*  HCT 23.9*  --   --  24.7* 21.7* 22.1*  MCV 93.7  --   --  93.6 94.3 94.8  PLT 112*  --   --  108* 99* 161*   Basic Metabolic Panel: Recent Labs  Lab 10/01/21 0003 10/02/21 0355 10/03/21 0449 10/04/21 0544  NA 132* 136 136 133*  K 3.9 4.1 3.6 3.9  CL 100 105 103 104  CO2 26 25 26 25   GLUCOSE 114* 112* 95 114*  BUN 28* 25* 20 14  CREATININE 2.65* 1.87* 1.31* 1.04  CALCIUM 8.0* 7.9* 7.9* 7.7*  MG  --   --  1.5*  --   PHOS  --   --  3.9  --    GFR: Estimated Creatinine Clearance: 110.2 mL/min (by C-G formula based on SCr of 1.04 mg/dL). Liver Function Tests: Recent Labs  Lab 10/01/21 0003 10/02/21 0355 10/03/21 0449 10/04/21 0544  AST 150* 125* 100* 124*  ALT 52* 47* 38 46*  ALKPHOS 112 116 96 91  BILITOT 6.8* 5.3* 4.5* 4.9*  PROT 6.8 6.7 6.1* 5.7*  ALBUMIN <1.5* 1.5* 1.6* 1.6*   Recent Labs  Lab 10/01/21 0003  LIPASE 102*   Recent Labs  Lab 10/01/21 0003  AMMONIA 164*   Coagulation Profile: Recent Labs  Lab 10/01/21 0234 10/02/21 1152 10/03/21 0449 10/04/21 0544  INR 2.5* 2.4* 2.7* 2.8*   Cardiac Enzymes: No results for input(s): "CKTOTAL", "CKMB", "CKMBINDEX", "TROPONINI" in the last 168 hours. BNP (last 3  results) No results for input(s): "PROBNP" in the last 8760 hours. HbA1C: No results for input(s): "HGBA1C" in the last 72 hours. CBG: No results for input(s): "GLUCAP" in the last 168 hours. Lipid Profile: No results for input(s): "CHOL", "HDL", "LDLCALC", "TRIG", "CHOLHDL", "LDLDIRECT" in the last 72 hours. Thyroid Function Tests: No results for input(s): "TSH", "T4TOTAL", "FREET4", "T3FREE", "THYROIDAB" in the last 72 hours. Anemia Panel: No results for input(s): "VITAMINB12", "FOLATE", "FERRITIN", "TIBC", "IRON", "RETICCTPCT" in the last 72 hours.  Urine analysis:    Component Value Date/Time   COLORURINE AMBER (A) 10/01/2021 0003   APPEARANCEUR HAZY (A) 10/01/2021 0003   LABSPEC 1.012 10/01/2021 0003   PHURINE 5.0 10/01/2021 0003   GLUCOSEU NEGATIVE 10/01/2021 0003  HGBUR NEGATIVE 10/01/2021 0003   BILIRUBINUR NEGATIVE 10/01/2021 0003   KETONESUR NEGATIVE 10/01/2021 0003   PROTEINUR NEGATIVE 10/01/2021 0003   NITRITE NEGATIVE 10/01/2021 0003   LEUKOCYTESUR NEGATIVE 10/01/2021 0003   Sepsis Labs: @LABRCNTIP (procalcitonin:4,lacticidven:4)  ) Recent Results (from the past 240 hour(s))  Varicella-zoster by PCR     Status: None   Collection Time: 10/01/21  1:30 PM   Specimen: Blood  Result Value Ref Range Status   Varicella-Zoster, PCR Negative Negative Final    Comment: (NOTE) No Varicella Zoster Virus DNA detected. This test was developed and its performance characteristics determined by [facility].  It has not been cleared or approved by the Food and Drug Administration.  The FDA has determined that such clearance or approval is not necessary. Performed At: [facility] [address] [place], [place] [id number] MD Jolene Schimke   Body fluid culture w Gram Stain     Status: None (Preliminary result)   Collection Time: 10/01/21  3:27 PM   Specimen: PATH Cytology Peritoneal fluid  Result Value Ref Range Status   Specimen Description   Final     CYTO PERI Performed at [facility], [address] [place], [place]    Special Requests   Final    NONE Performed at [facility], [address] [place], [place]    Gram Stain   Final    CYTOSPIN SMEAR WBC PRESENT, PREDOMINANTLY MONONUCLEAR NO ORGANISMS SEEN    Culture   Final    NO GROWTH 3 DAYS Performed at [facility], [address], [place], [place]    Report Status PENDING  Incomplete         Radiology Studies: No results found.      Scheduled Meds:  (feeding supplement) PROSource Plus  30 mL Oral BID BM   feeding supplement  237 mL Oral BID BM   feeding supplement (KATE [place]AR[place] STANDARD 1.4)  325 mL Oral Daily   folic acid  1 mg Oral Daily   furosemide  40 mg Oral Daily   lactulose  10 g Oral BID   multivitamin with minerals  1 tablet Oral Daily   pantoprazole (PROTONIX) IV  40 mg Intravenous Q12H   spironolactone  100 mg Oral Daily   thiamine  100 mg Oral Daily   Or   thiamine  100 mg Intravenous Daily   Continuous Infusions:  cefTRIAXone (ROCEPHIN)  IV 2 g (10/04/21 1020)   octreotide (SANDOSTATIN) 500 mcg in sodium chloride 0.9 % 250 mL (2 mcg/mL) infusion 50 mcg/hr (10/04/21 0848)     LOS: 3 days     10/06/21, MD [facility]   If 7PM-7AM, please contact night-coverage [email or web address] Password [email or web address] 10/04/2021, 2:01 PM

## 2021-10-04 NOTE — Progress Notes (Signed)
End of shift note: 0700-1900  Pt was started on IV lasix, PO spironolactone and he was made strict I&O with a 1200 ml FR. The plan is for him to have a paracentesis tomorrow.

## 2021-10-05 DIAGNOSIS — K709 Alcoholic liver disease, unspecified: Secondary | ICD-10-CM | POA: Diagnosis not present

## 2021-10-05 LAB — COMPREHENSIVE METABOLIC PANEL
ALT: 46 U/L — ABNORMAL HIGH (ref 0–44)
AST: 136 U/L — ABNORMAL HIGH (ref 15–41)
Albumin: 1.7 g/dL — ABNORMAL LOW (ref 3.5–5.0)
Alkaline Phosphatase: 78 U/L (ref 38–126)
Anion gap: 3 — ABNORMAL LOW (ref 5–15)
BUN: 12 mg/dL (ref 6–20)
CO2: 27 mmol/L (ref 22–32)
Calcium: 7.5 mg/dL — ABNORMAL LOW (ref 8.9–10.3)
Chloride: 106 mmol/L (ref 98–111)
Creatinine, Ser: 1.07 mg/dL (ref 0.61–1.24)
GFR, Estimated: >60 mL/min (ref >60)
Glucose, Bld: 101 mg/dL — ABNORMAL HIGH (ref 70–99)
Potassium: 3.9 mmol/L (ref 3.5–5.1)
Sodium: 136 mmol/L (ref 135–145)
Total Bilirubin: 4.4 mg/dL — ABNORMAL HIGH (ref 0.3–1.2)
Total Protein: 5.6 g/dL — ABNORMAL LOW (ref 6.5–8.1)

## 2021-10-05 LAB — BODY FLUID CULTURE W GRAM STAIN: Culture: NO GROWTH

## 2021-10-05 MED ORDER — LACTULOSE 10 GM/15ML PO SOLN: 10.0000 g | Freq: Two times a day (BID) | ORAL | 0 refills | Status: DC

## 2021-10-05 MED ORDER — FUROSEMIDE 40 MG PO TABS: 40.0000 mg | ORAL_TABLET | Freq: Every day | ORAL | 1 refills | Status: DC

## 2021-10-05 MED ORDER — FOLIC ACID 1 MG PO TABS: 1.0000 mg | ORAL_TABLET | Freq: Every day | ORAL | 1 refills | Status: DC

## 2021-10-05 MED ORDER — SPIRONOLACTONE 100 MG PO TABS: 100.0000 mg | ORAL_TABLET | Freq: Every day | ORAL | 1 refills | Status: DC

## 2021-10-05 MED ORDER — ADULT MULTIVITAMIN W/MINERALS CH: 1.0000 | ORAL_TABLET | Freq: Every day | ORAL | 1 refills | Status: DC

## 2021-10-05 NOTE — Progress Notes (Signed)
Thad Friis to be D/C'd Home per MD order.  Discussed prescriptions and follow up appointments with the patient. Prescriptions given to patient, medication list explained in detail. Pt verbalized understanding.  Allergies as of 10/05/2021   No Known Allergies      Medication List     STOP taking these medications    bumetanide 1 MG tablet Commonly known as: BUMEX   FLUoxetine 10 MG capsule Commonly known as: PROZAC   traZODone 50 MG tablet Commonly known as: DESYREL       TAKE these medications    folic acid 1 MG tablet Commonly known as: FOLVITE Take 1 tablet (1 mg total) by mouth daily.   furosemide 40 MG tablet Commonly known as: LASIX Take 1 tablet (40 mg total) by mouth daily.   lactulose 10 GM/15ML solution Commonly known as: CHRONULAC Take 15 mLs (10 g total) by mouth 2 (two) times daily.   multivitamin with minerals Tabs tablet Take 1 tablet by mouth daily.   spironolactone 100 MG tablet Commonly known as: ALDACTONE Take 1 tablet (100 mg total) by mouth daily.        Vitals:   10/05/21 0415 10/05/21 0800  BP: 103/63 99/66  Pulse: 76 70  Resp: 18 18  Temp: 99.2 F (37.3 C) 98.6 F (37 C)  SpO2: 98% 98%    Skin clean, dry and intact without evidence of skin break down, no evidence of skin tears noted. IV catheter discontinued intact. Site without signs and symptoms of complications. Dressing and pressure applied. Pt denies pain at this time. No complaints noted.  An After Visit Summary was printed and given to the patient. Patient escorted via [place], and D/C home via private auto.  Fuller Mandril, RN

## 2021-10-05 NOTE — Plan of Care (Signed)
  Problem: Health Behavior/Discharge Planning: Goal: Ability to manage health-related needs will improve Outcome: Progressing   Problem: Health Behavior/Discharge Planning: Goal: Ability to manage health-related needs will improve Outcome: Progressing   Problem: Clinical Measurements: Goal: Ability to maintain clinical measurements within normal limits will improve Outcome: Progressing   Problem: Activity: Goal: Risk for activity intolerance will decrease Outcome: Progressing   Problem: Nutrition: Goal: Adequate nutrition will be maintained Outcome: Progressing   Problem: Coping: Goal: Level of anxiety will decrease Outcome: Progressing

## 2021-10-05 NOTE — Discharge Summary (Signed)
Andrew Cummings YQM:[id number] DOB: [birth year] DOA: 10/01/2021  PCP: London Pepper, MD  Admit date: 10/01/2021 Discharge date: 10/05/2021  Time spent: 40 minutes  Recommendations for Outpatient Follow-up:  GI f/u 1 week, needs bmp and cbc then    Discharge Diagnoses:  Principal Problem:   Alcoholic liver disease ([place]) Active Problems:   Hepatorenal syndrome ([place])   Thrombocytopenia ([place])   Anasarca   Pancreatitis   Alcohol use disorder   History of seizure due to alcohol withdrawal   Anemia   AKI (acute kidney injury) ([place])   Constipation   Alcoholic cirrhosis of liver with ascites ([place])   Malnutrition of moderate degree   Decompensated hepatic cirrhosis ([place])   Discharge Condition: stable  Diet recommendation: low sodium fluid restrict  Filed Weights   09/30/21 2357  Weight: 91.2 kg    History of present illness:  From admission h and p Andrew Cummings is a 41 y.o. male with medical history significant for Alcohol use disorder with complications of alcohol withdrawal seizures, alcoholic ketoacidosis and gastritis, thrombocytopenia secondary to alcohol use who presents to the ED with progressively worsening lower extremity edema and alcohol bloating, poor appetite and weakness.  He denies shortness of breath or leg pain or chest pain.  He denies abdominal pain  Hospital Course:  Patient presented with decompensated alcohol liver disease. For his cirrhosis u/s of liver showed no signs hcc with patent portal system. Hiv, hcv, hbv, and rpr negative. For his ascites and edema he was treated with albumin. When kidney function normalized he was started on lasix/spironolactone which he tolerated without worsening of kidney function so these meds will be continued. He presented with aki with cr in the 3s which normalized after several days albumin infusion. There was initial concern for gi bleed given low hemoglobin but no clinical signs of bleeding and hgb held steady in the  7s. He was treated for this possible gi bleed with ppi, ceftriaxone given ascites, and octreotide, which were subsequently discontinued. Given new ascites paracentesis was performed. No signs sbp and analysis consistent w/ portal hypertension. For coagulopathy he received 3 days vitamin k. For constipation he was started on lactulose which he requested we continue at discharge - he showed no signs of encephalopathy. Last drink was several weeks ago and no signs withdrawal here. TOC met with patient and provided substance abuse resources. GI deferred EGD inpatient. Plan will be close (1 wk) f/u with GI. Alcohol cessation strongly advised.  Procedures: paracentesis  Consultations: GI  Discharge Exam: Vitals:   10/05/21 0415 10/05/21 0800  BP: 103/63 99/66  Pulse: 76 70  Resp: 18 18  Temp: 99.2 F (37.3 C) 98.6 F (37 C)  SpO2: 98% 98%    General: nad Cardiovascular: rrr Respiratory: ctab Abd: distended, soft Ext: pitting edema to abdomen  Discharge Instructions   Discharge Instructions     Diet - low sodium heart healthy   Complete by: As directed    Increase activity slowly   Complete by: As directed       Allergies as of 10/05/2021   No Known Allergies      Medication List     STOP taking these medications    bumetanide 1 MG tablet Commonly known as: BUMEX   FLUoxetine 10 MG capsule Commonly known as: PROZAC   traZODone 50 MG tablet Commonly known as: DESYREL       TAKE these medications    folic acid 1 MG tablet Commonly known as: FOLVITE Take  1 tablet (1 mg total) by mouth daily.   furosemide 40 MG tablet Commonly known as: LASIX Take 1 tablet (40 mg total) by mouth daily.   lactulose 10 GM/15ML solution Commonly known as: CHRONULAC Take 15 mLs (10 g total) by mouth 2 (two) times daily.   multivitamin with minerals Tabs tablet Take 1 tablet by mouth daily.   spironolactone 100 MG tablet Commonly known as: ALDACTONE Take 1 tablet (100 mg  total) by mouth daily.       No Known Allergies  Follow-up Information     Jonathon Bellows, MD Follow up.   Specialty: Gastroenterology Contact information: [place] [address] [phone]         London Pepper, MD Follow up.   Specialty: Family Medicine Contact information: [place] [address] [phone]                  The results of significant diagnostics from this hospitalization (including imaging, microbiology, ancillary and laboratory) are listed below for reference.    Significant Diagnostic Studies: US Paracentesis  Result Date: 10/01/2021 INDICATION: Patient with acute liver failure and alcoholic liver cirrhosis with ascites request received for diagnostic and therapeutic paracentesis. EXAM: ULTRASOUND GUIDED  PARACENTESIS MEDICATIONS: Local 1% lidocaine only. COMPLICATIONS: None immediate. PROCEDURE: Informed written consent was obtained from the patient after a discussion of the risks, benefits and alternatives to treatment. A timeout was performed prior to the initiation of the procedure. Initial ultrasound scanning demonstrates a small to moderate amount of ascites within the right upper abdominal quadrant. The right upper abdomen was prepped and draped in the usual sterile fashion. 1% lidocaine was used for local anesthesia. Following this, a 19 gauge, 7-cm, Yueh catheter was introduced. An ultrasound image was saved for documentation purposes. The paracentesis was performed. The catheter was removed and a dressing was applied. The patient tolerated the procedure well without immediate post procedural complication. FINDINGS: A total of approximately 1.5 L of clear yellow fluid was removed. Samples were sent to the laboratory as requested by the clinical team. IMPRESSION: Successful ultrasound-guided paracentesis yielding 1.5 liters of peritoneal fluid. PLAN: If the patient eventually requires  >/=2 paracenteses in a 30 day period, candidacy for formal evaluation by the [facility] will be assessed. This exam was performed by Tsosie Billing PA-C, and was supervised and interpreted by Dr. Anselm Pancoast. Electronically Signed   By: Markus Daft M.D.   On: 10/01/2021 16:49   US LIVER DOPPLER  Result Date: 10/01/2021 CLINICAL DATA:  Hepatic failure. EXAM: DUPLEX ULTRASOUND OF LIVER TECHNIQUE: Color and duplex Doppler ultrasound was performed to evaluate the hepatic in-flow and out-flow vessels. COMPARISON:  Right upper quadrant ultrasound earlier today. FINDINGS: Portal Vein Velocities Main:  26-43 cm/sec Right:  25 cm/sec Left:  14 cm/sec Hepatic Vein Velocities Right:  42 cm/sec Middle:  30 cm/sec Left:  40 cm/sec Hepatic Artery Velocity:  127 cm/sec Splenic Vein Velocity:  17 cm/sec Varices: None visualized. Ascites: Ascites present in the visualized peritoneal cavity. Splenomegaly with estimated splenic volume of at least 450 mL. Recanalized umbilical vein implicating underlying portal hypertension. Portal vein waveforms demonstrate to and fro flow. No evidence of portal vein thrombus. Hepatic vein waveforms are somewhat pulsatile. This can be seen in the setting right heart failure but also in the setting of cirrhosis. Liver morphology is consistent with cirrhosis. IMPRESSION: 1. Evidence of portal hypertension with to and fro  flow in the portal vein, recanalization of the umbilical vein and splenomegaly. No evidence of portal vein thrombosis. 2. Ascites in the visualized peritoneal cavity. 3. Cirrhosis of the liver. Electronically Signed   By: Aletta Edouard M.D.   On: 10/01/2021 16:00   [place] ASCITES (ABDOMEN LIMITED)  Result Date: 10/01/2021 CLINICAL DATA:  Abdominal distension. EXAM: LIMITED ABDOMEN ULTRASOUND FOR ASCITES TECHNIQUE: Limited ultrasound survey for ascites was performed in all four abdominal quadrants. COMPARISON:  None Available. FINDINGS:  Moderate volume 4 quadrant abdominal ascites. IMPRESSION: Moderate volume abdominal ascites. Electronically Signed   By: Marijo Sanes M.D.   On: 10/01/2021 08:10   US Abdomen Limited RUQ (LIVER/GB)  Result Date: 10/01/2021 CLINICAL DATA:  Elevated liver function studies. EXAM: ULTRASOUND ABDOMEN LIMITED RIGHT UPPER QUADRANT COMPARISON:  Ultrasound 03/13/2021.  MRI success 14 2022 FINDINGS: Gallbladder: Markedly thickened gallbladder wall likely due to the surrounding ascites. No gallstones. No sonographic Murphy sign. Common bile duct: Diameter: 2.0 mm Liver: Cirrhotic changes involving the liver with a very irregular liver contour, dilated hepatic fissures and moderate volume ascites. The portal vein is patent but demonstrates reversed flow consistent with portal venous hypertension. Other: Ascites. IMPRESSION: 1. Cirrhotic changes involving the liver with reversed flow in the portal vein. No worrisome hepatic lesions or intrahepatic biliary dilatation. Moderate volume abdominal ascites. 2. Markedly thickened gallbladder wall likely related to the surrounding ascites and probable low albumin. No gallstones. Electronically Signed   By: Marijo Sanes M.D.   On: 10/01/2021 08:09    Microbiology: Recent Results (from the past 240 hour(s))  Varicella-zoster by PCR     Status: None   Collection Time: 10/01/21  1:30 PM   Specimen: Blood  Result Value Ref Range Status   Varicella-Zoster, PCR Negative Negative Final    Comment: (NOTE) No Varicella Zoster Virus DNA detected. This test was developed and its performance characteristics determined by [facility].  It has not been cleared or approved by the Food and Drug Administration.  The FDA has determined that such clearance or approval is not necessary. Performed At: [facility] [place], [place] [id number] Rush Farmer MD [id number]   Body fluid culture w Gram Stain     Status: None (Preliminary result)   Collection Time:  10/01/21  3:27 PM   Specimen: PATH Cytology Peritoneal fluid  Result Value Ref Range Status   Specimen Description   Final    CYTO PERI Performed at [facility], [address] [place], [place] [address]    Special Requests   Final    NONE Performed at [facility], [address] [place], [place] [address]    Gram Stain   Final    CYTOSPIN SMEAR WBC PRESENT, PREDOMINANTLY MONONUCLEAR NO ORGANISMS SEEN    Culture   Final    NO GROWTH 3 DAYS Performed at [facility] [address], [place], [place] [address]    Report Status PENDING  Incomplete     Labs: Basic Metabolic Panel: Recent Labs  Lab 10/01/21 0003 10/02/21 0355 10/03/21 0449 10/04/21 0544 10/05/21 0555  NA 132* 136 136 133* 136  K 3.9 4.1 3.6 3.9 3.9  CL 100 105 103 104 106  CO2 _0 GLUCOSE 114* 112* 95 114* 101*  BUN 28* 25* _1 CREATININE 2.65* 1.87* 1.31* 1.04 1.07  CALCIUM 8.0* 7.9* 7.9* 7.7* 7.5*  MG  --   --  1.5*  --   --  PHOS  --   --  3.9  --   --    Liver Function Tests: Recent Labs  Lab 10/01/21 0003 10/02/21 0355 10/03/21 0449 10/04/21 0544 10/05/21 0555  AST 150* 125* 100* 124* 136*  ALT 52* 47* 38 46* 46*  ALKPHOS 112 116 96 91 78  BILITOT 6.8* 5.3* 4.5* 4.9* 4.4*  PROT 6.8 6.7 6.1* 5.7* 5.6*  ALBUMIN <1.5* 1.5* 1.6* 1.6* 1.7*   Recent Labs  Lab 10/01/21 0003  LIPASE 102*   Recent Labs  Lab 10/01/21 0003  AMMONIA 164*   CBC: Recent Labs  Lab 10/01/21 0003 10/01/21 0541 10/01/21 1330 10/02/21 0355 10/03/21 0449 10/04/21 0544  WBC 9.1  --   --  8.6 7.4 6.6  NEUTROABS 5.3  --   --   --   --   --   HGB 8.3* 7.9* 8.7* 8.6* 7.6* 7.5*  HCT 23.9*  --   --  24.7* 21.7* 22.1*  MCV 93.7  --   --  93.6 94.3 94.8  PLT 112*  --   --  108* 99* 107*   Cardiac Enzymes: No results for input(s): "CKTOTAL", "CKMB", "CKMBINDEX", "TROPONINI" in the last 168 hours. BNP: BNP (last 3 results) No results for input(s): "BNP" in the  last 8760 hours.  ProBNP (last 3 results) No results for input(s): "PROBNP" in the last 8760 hours.  CBG: No results for input(s): "GLUCAP" in the last 168 hours.     Signed:  Desma Maxim MD.  [facility] 10/05/2021, 9:46 AM

## 2021-10-06 LAB — HSV DNA BY PCR (REFERENCE LAB)
HSV 1 DNA: NEGATIVE
HSV 2 DNA: NEGATIVE

## 2021-10-13 IMAGING — CT CT HEAD W/O CM
4 series · 16 of 47 positions shown, 18 images · non-contrast
Comparison: None.

CLINICAL DATA: Seizure, recent drinking band, last drink 24 hours
ago

EXAM:
CT HEAD WITHOUT CONTRAST
TECHNIQUE: Contiguous axial images were obtained from the base of the skull
through the vertex without intravenous contrast.

[Series 2: head wo · axial · 0.41mm/px · z∈[-124,-4]mm · 7 of 32 slices shown, 9 images]
[im 4/32  brain]
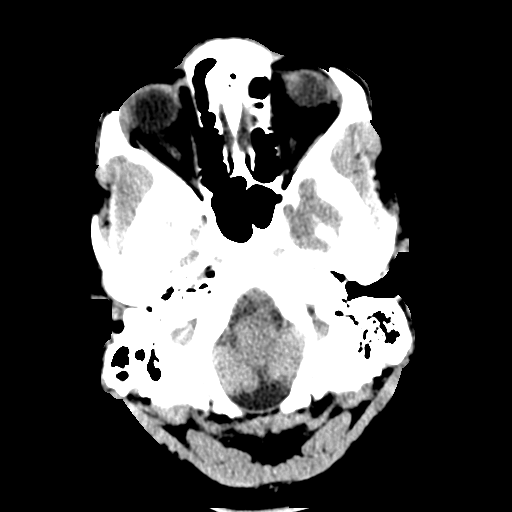
[im 4/32  bone]
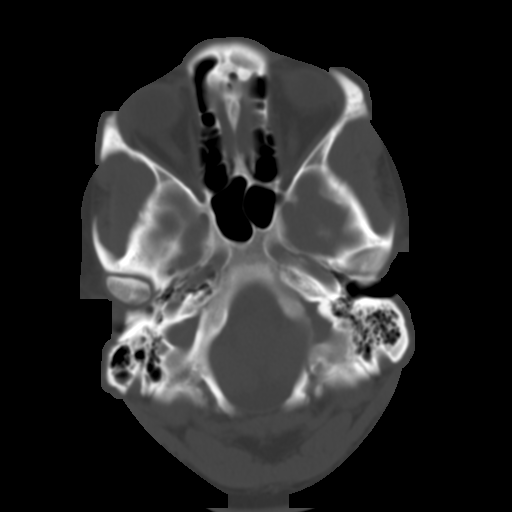
[im 8/32  brain]
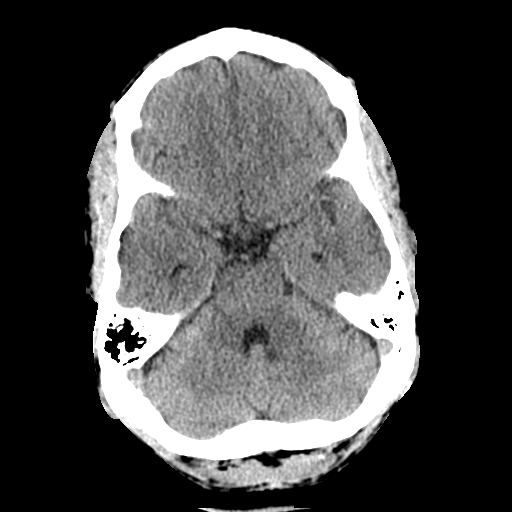
[im 12/32  brain]
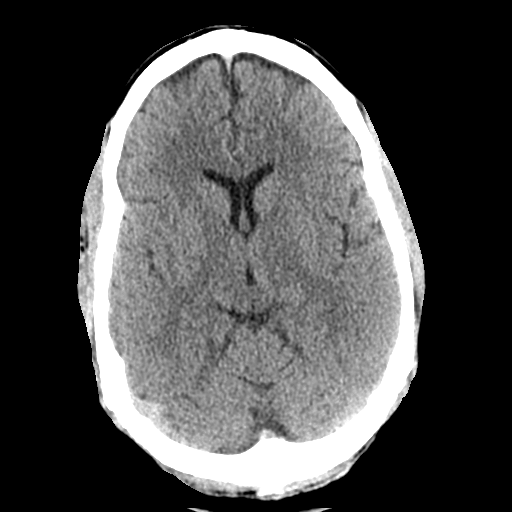
[im 16/32  brain]
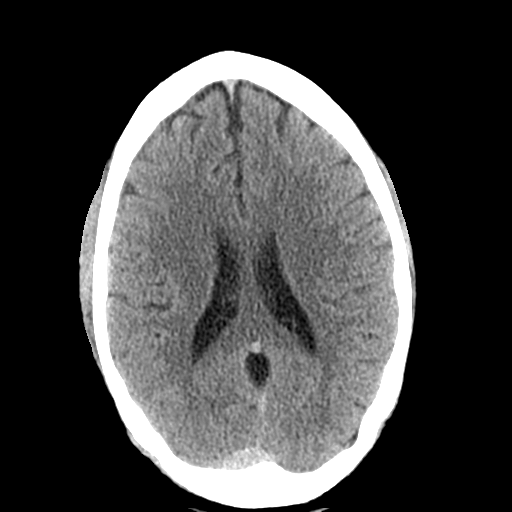
[im 20/32  brain]
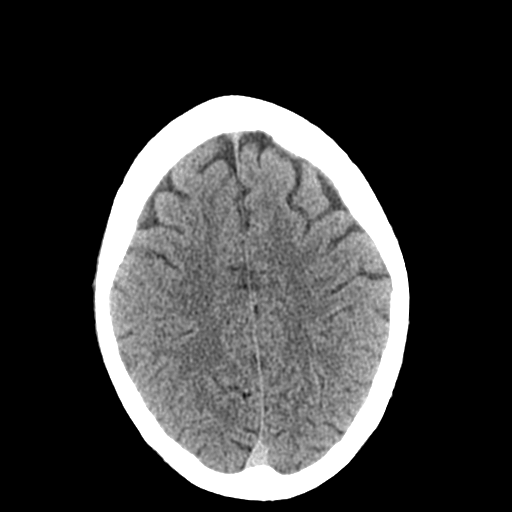
[im 20/32  bone]
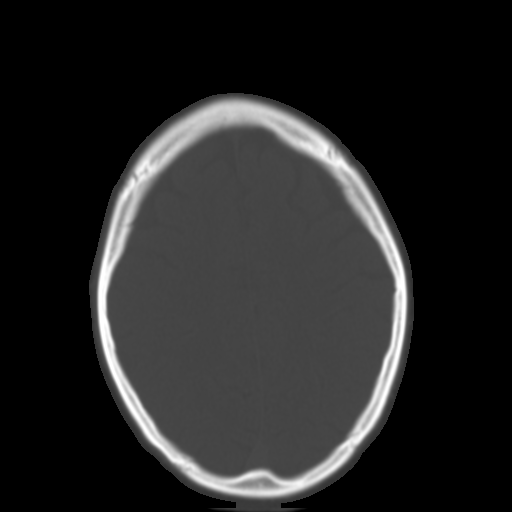
[im 24/32  brain]
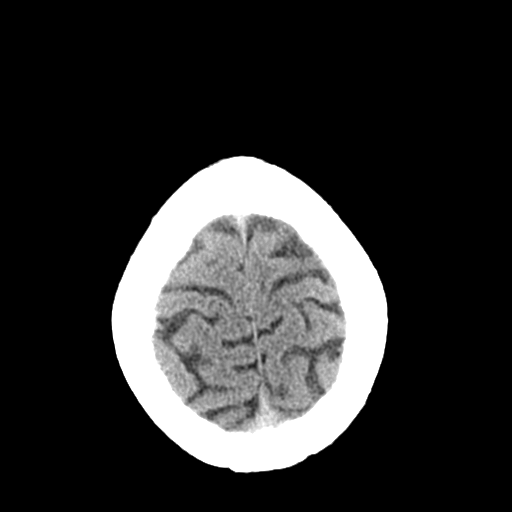
[im 28/32  brain]
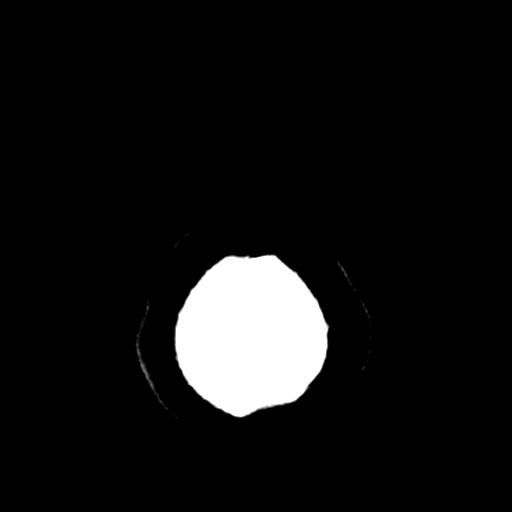

[Series 3: head bone · axial · 0.41mm/px · z∈[-125,-93]mm · 3 of 78 slices shown]
[im 8/78  bone]
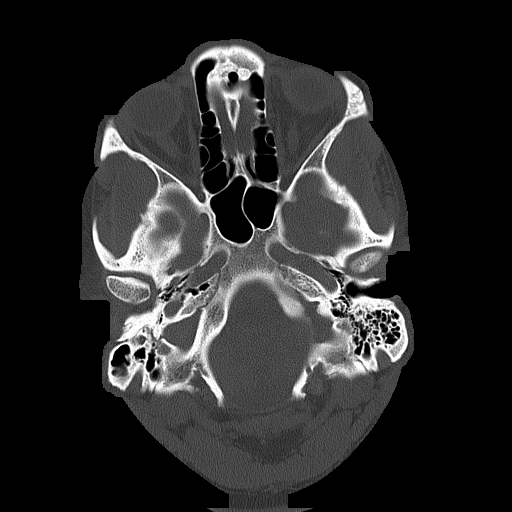
[im 16/78  bone]
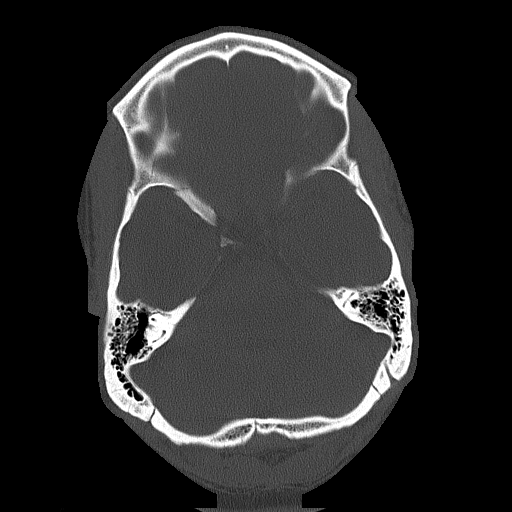
[im 24/78  bone]
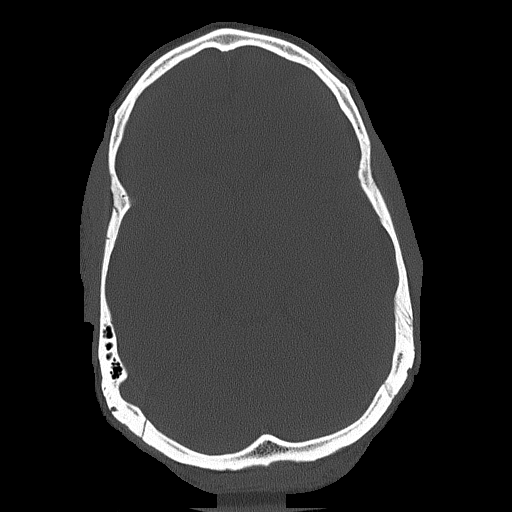

[Series 4: coronal soft tissue · coronal · 0.33mm/px · 3 of 74 slices shown]
[im 25/74  brain]
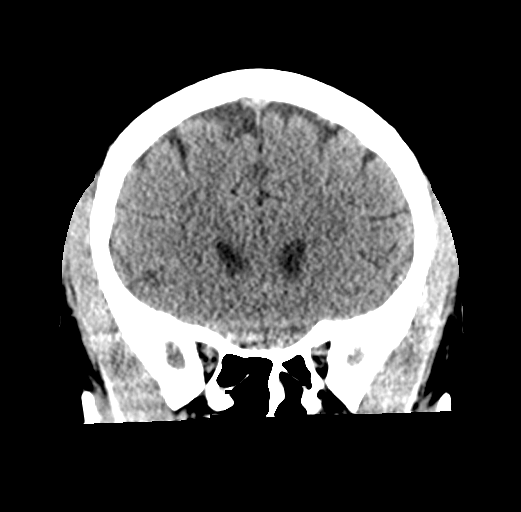
[im 33/74  brain]
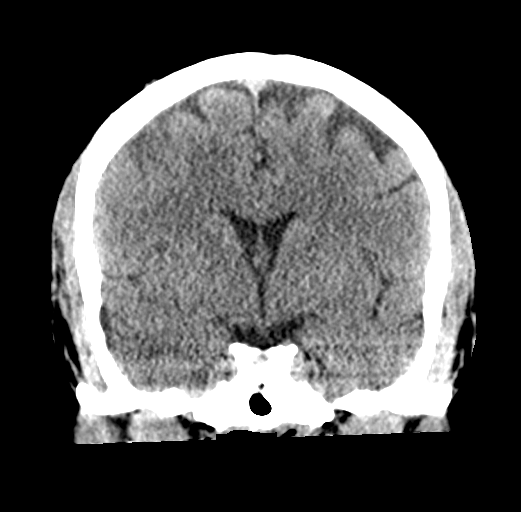
[im 41/74  brain]
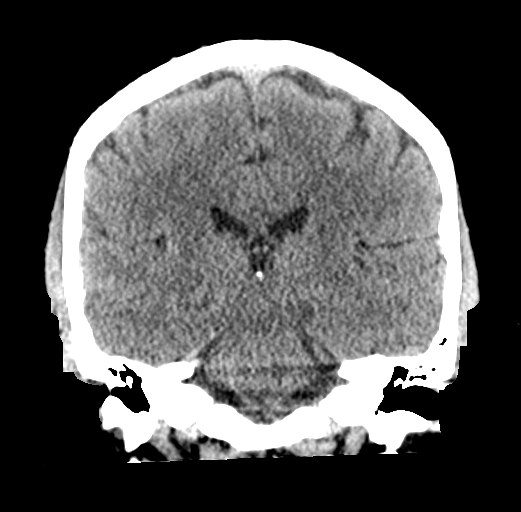

[Series 5: sagittal soft tissue · sagittal · 0.33mm/px · 3 of 58 slices shown]
[im 20/58  brain]
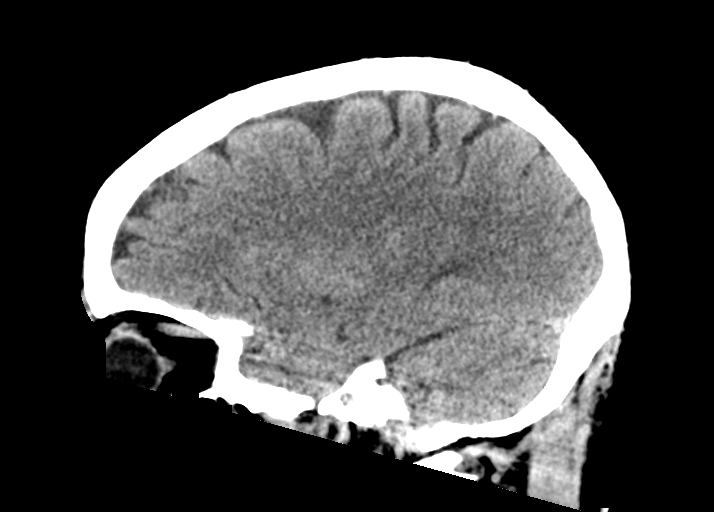
[im 29/58  brain]
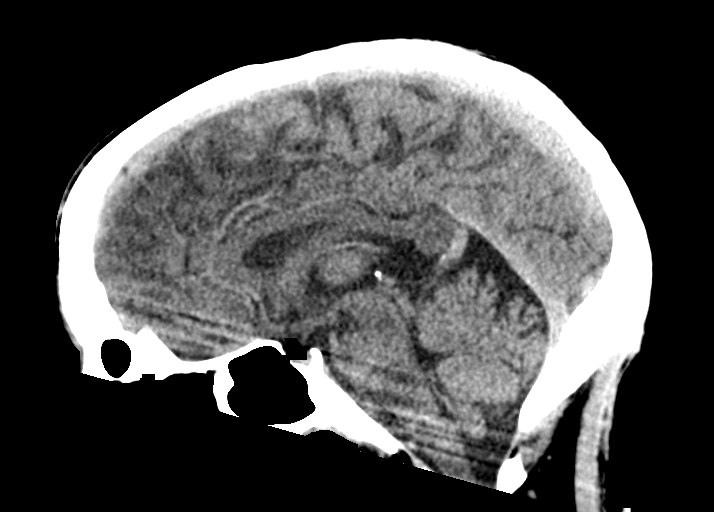
[im 39/58  brain]
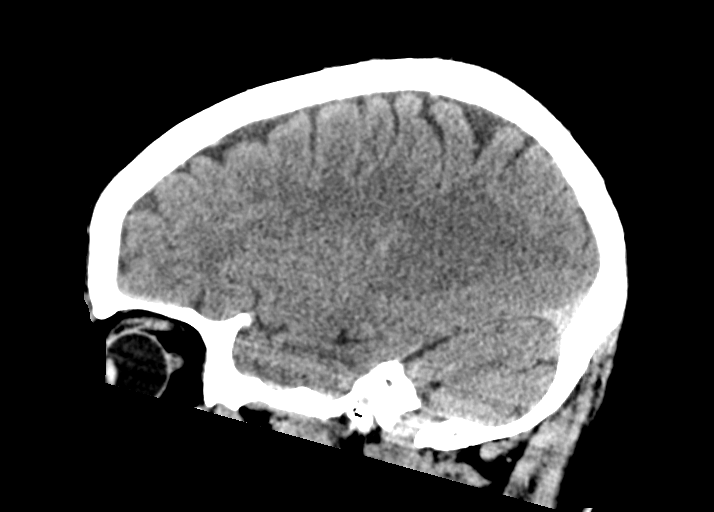

[16 of 47 positions shown; findings below may reference images not displayed]

FINDINGS: Brain: No evidence of acute infarction, hemorrhage, hydrocephalus,
extra-axial collection, visible mass lesion or mass effect.

Vascular: No hyperdense vessel or unexpected calcification.

Skull: No calvarial fracture or suspicious osseous lesion. No scalp
swelling or hematoma.

Sinuses/Orbits: Minimal thickening in the posterior ethmoid and
sphenoid sinuses. Remaining paranasal sinuses and mastoid air cells
are predominantly clear without layering air-fluid levels or
pneumatized secretions. Included orbital structures are
unremarkable.

Other: None.
IMPRESSION: No acute intracranial abnormality.

## 2021-10-13 IMAGING — DX DG SHOULDER 2+V PORT*R*
2 series · 2 of 2 positions shown · non-contrast
Comparison: None.

CLINICAL DATA: Right shoulder pain, deformity after seizure

EXAM:
PORTABLE RIGHT SHOULDER

[shoulder ap]
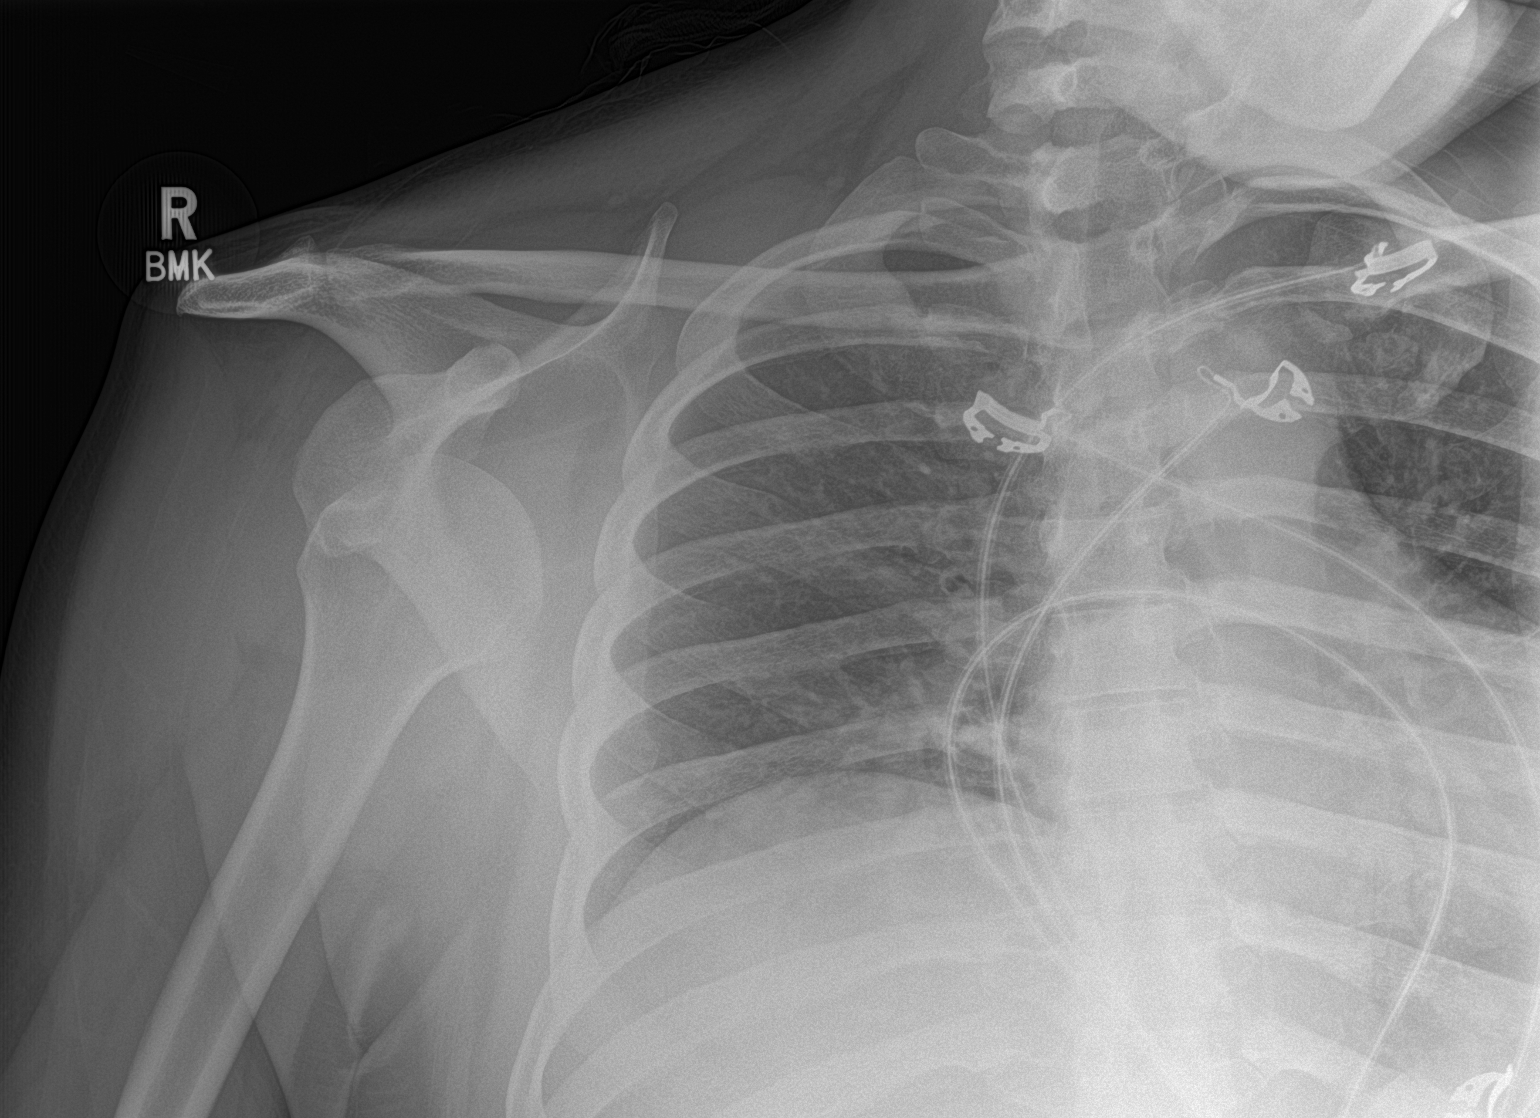

[shoulder obl]
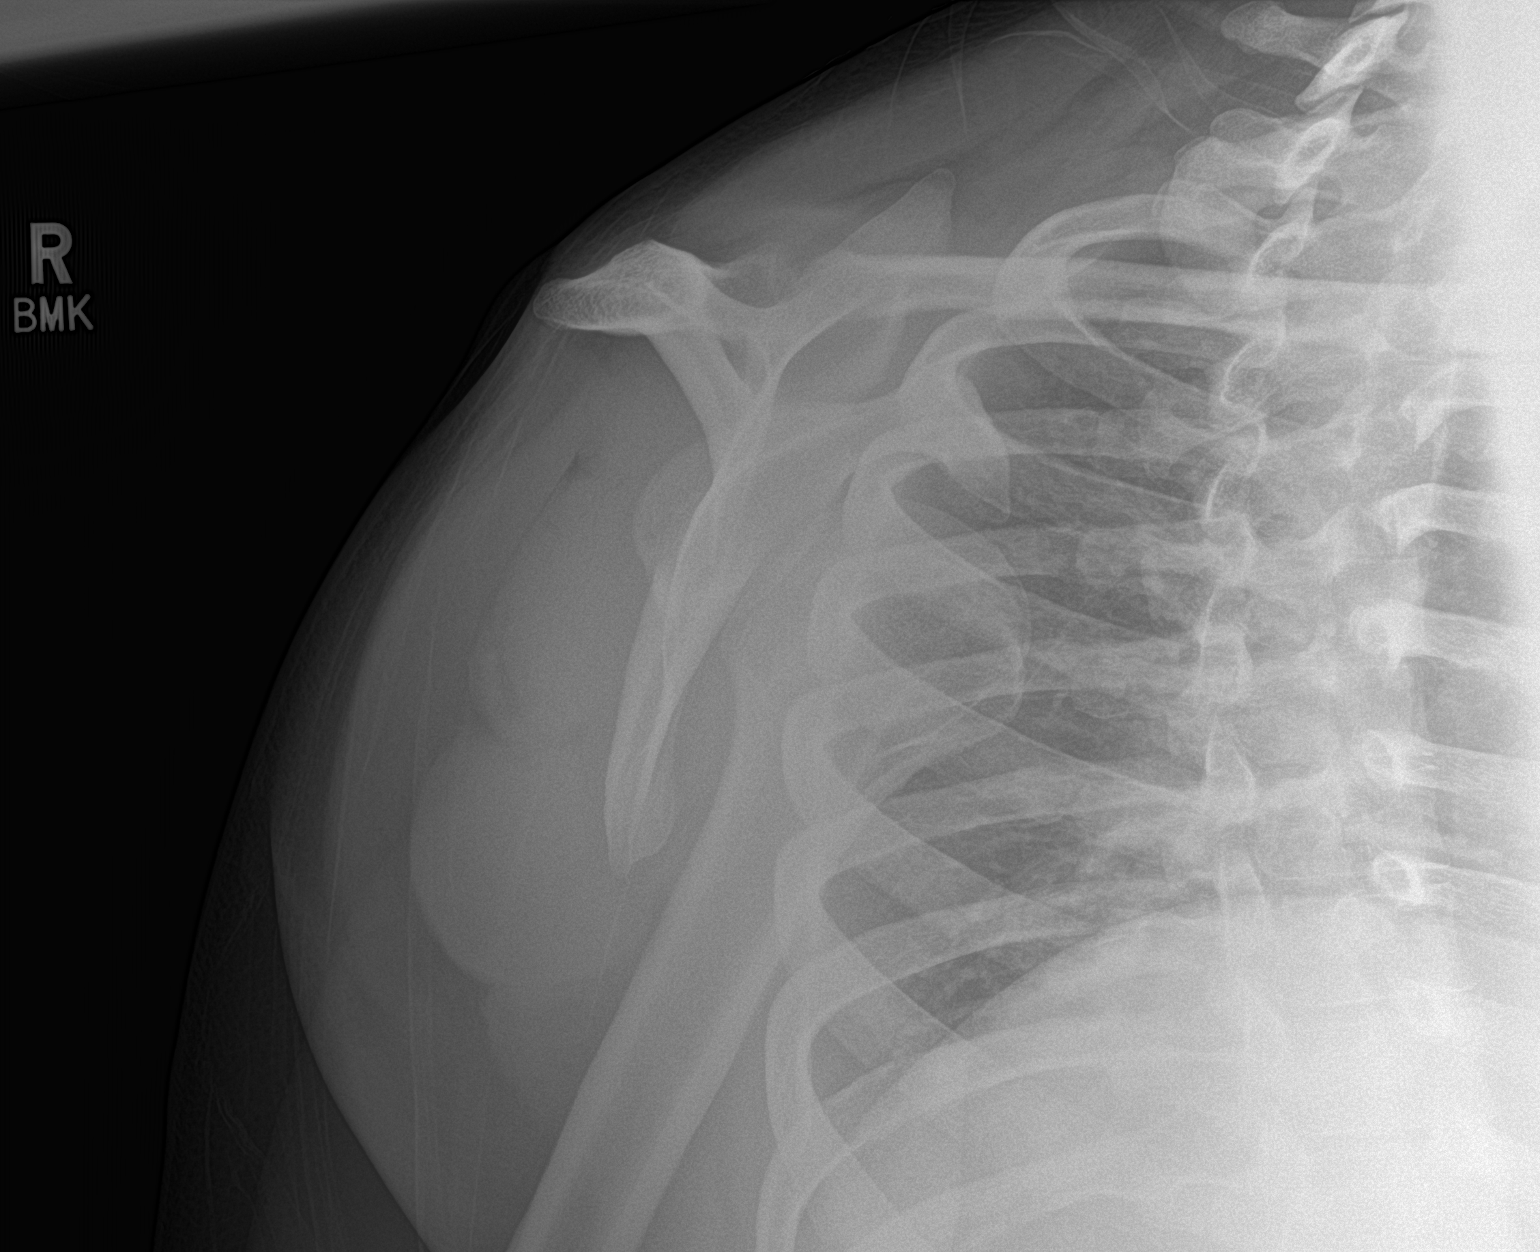

[2 of 2 positions shown; findings below may reference images not displayed]

FINDINGS: Anterior right shoulder dislocation. No visible fracture.
Degenerative changes in the right AC joint.
IMPRESSION: Right shoulder anterior dislocation.

## 2021-10-13 IMAGING — US US ABDOMEN LIMITED
1 series · 14 of 25 positions shown · non-contrast
Comparison: None.

CLINICAL DATA: Abnormal liver function tests

EXAM:
ULTRASOUND ABDOMEN LIMITED RIGHT UPPER QUADRANT

[Series 1: us abdomen limited ruq (liver/gb) · 14 of 97 slices shown]
[im 1/97]
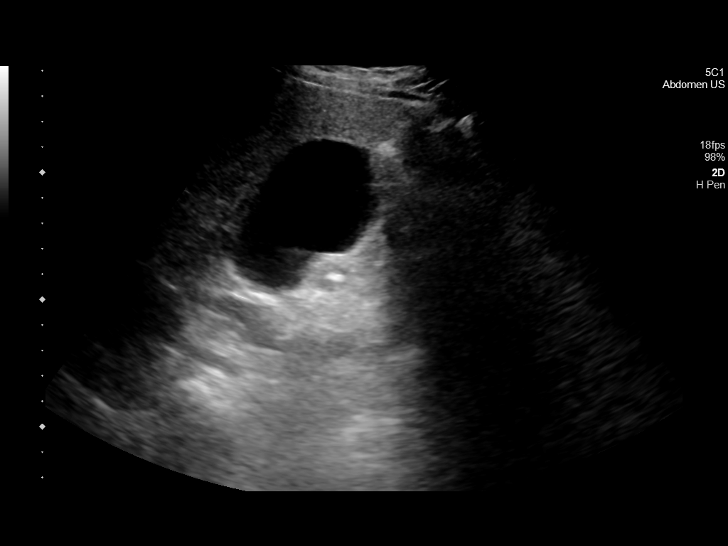
[im 9/97]
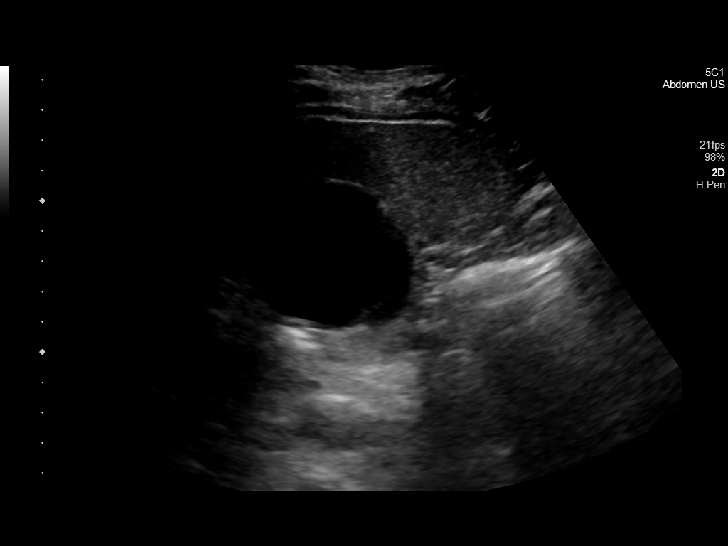
[im 17/97]
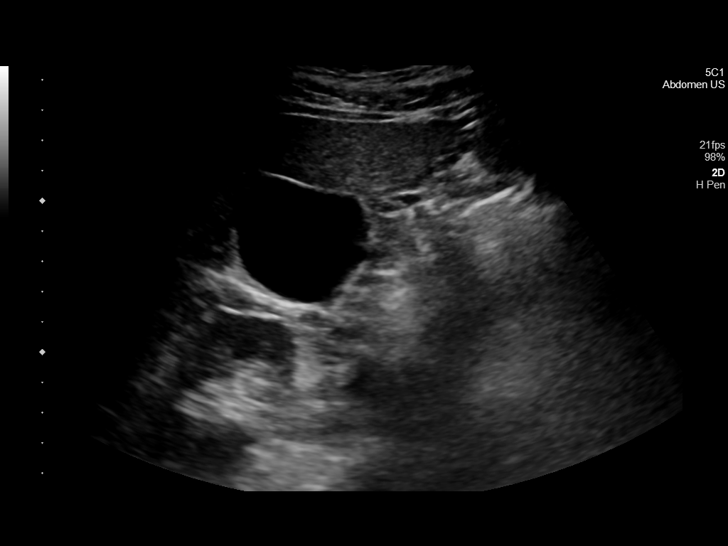
[im 25/97]
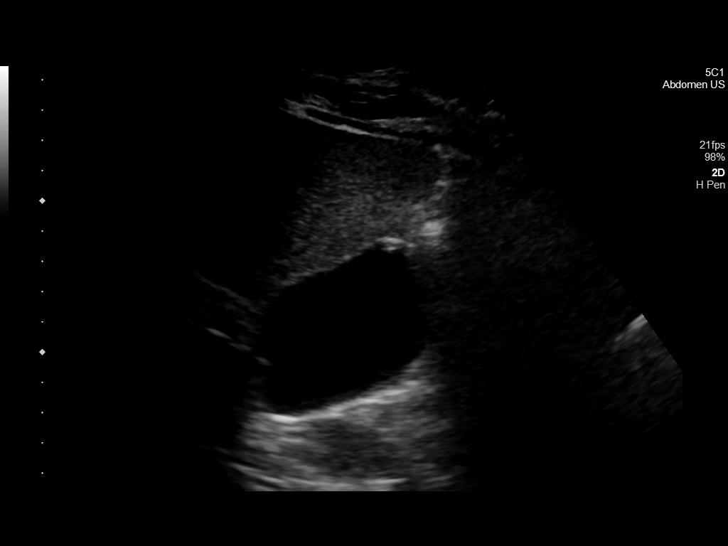
[im 33/97]
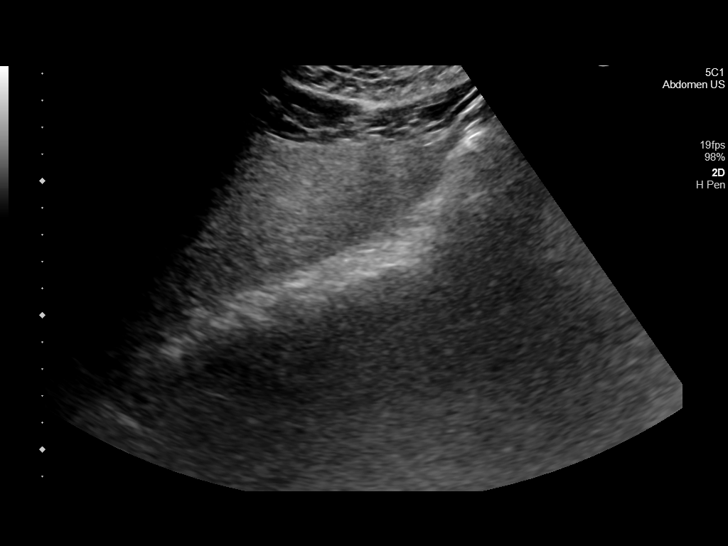
[im 37/97]
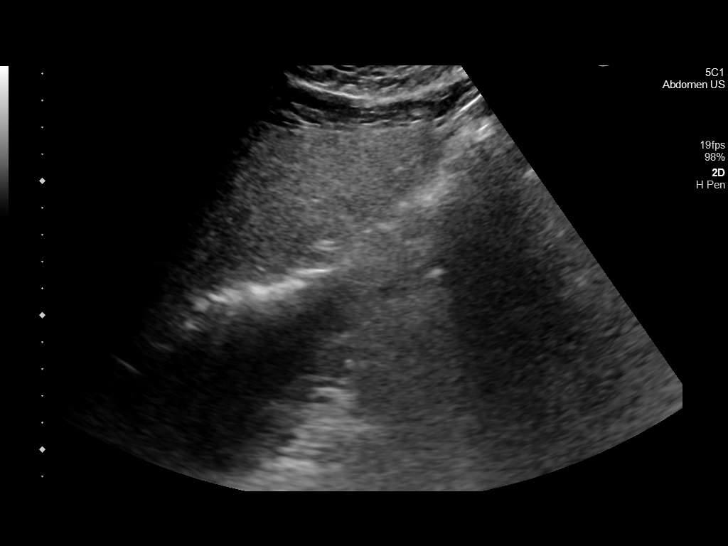
[im 45/97]
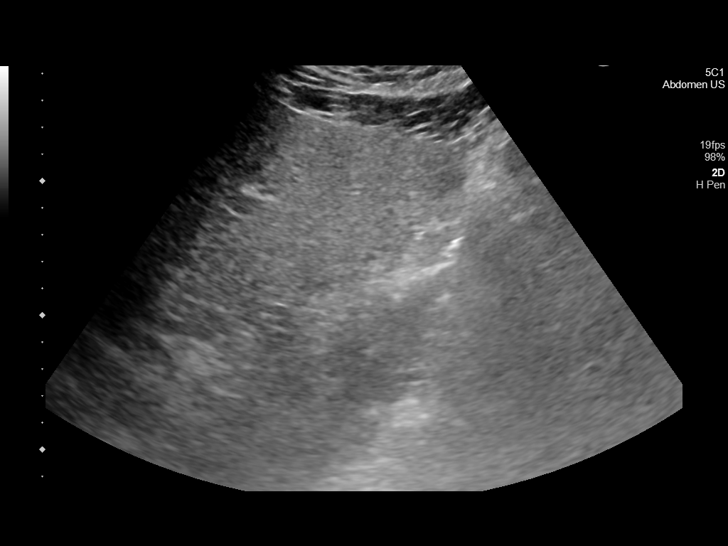
[im 53/97]
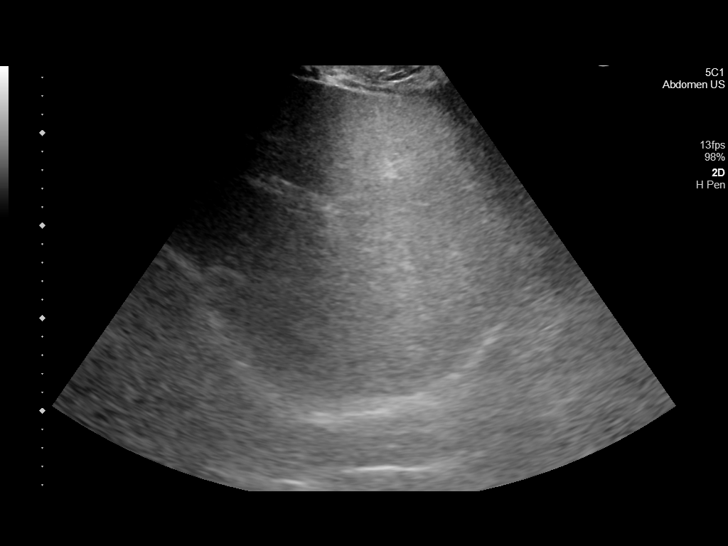
[im 61/97]
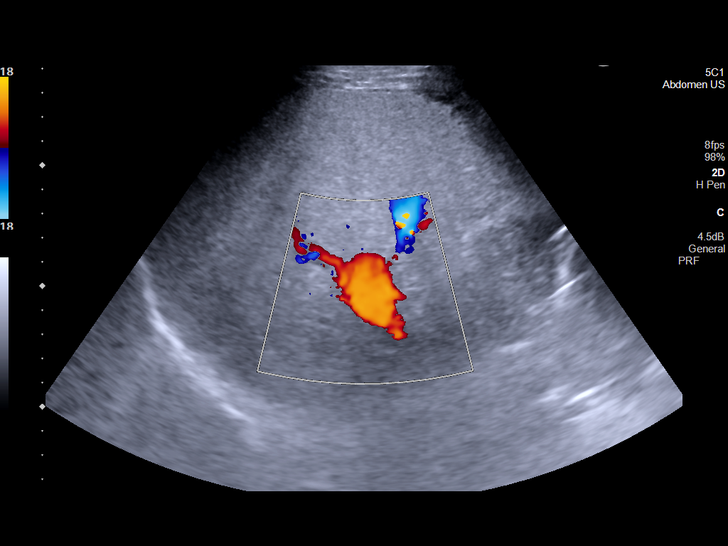
[im 65/97]
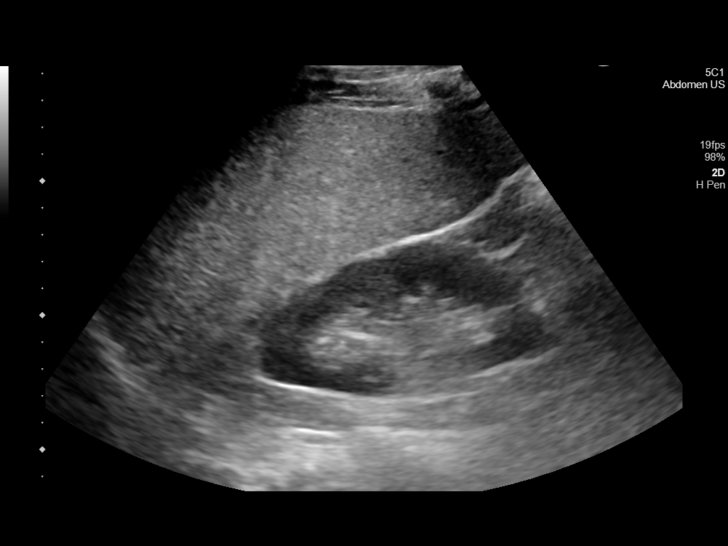
[im 73/97]
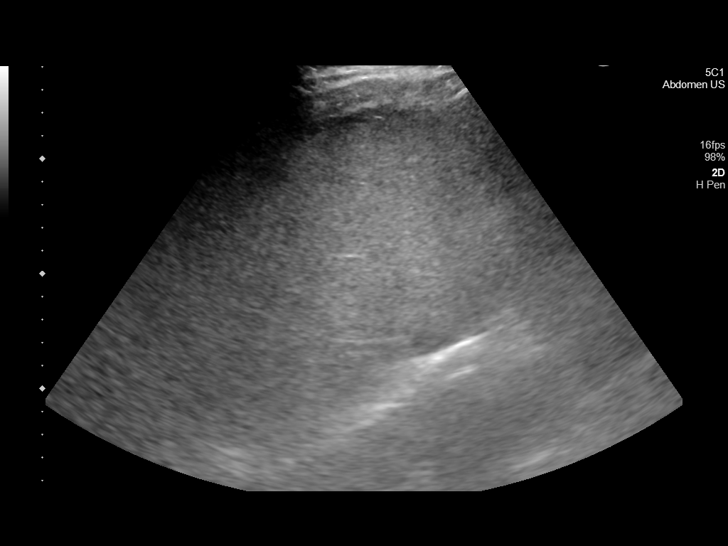
[im 81/97]
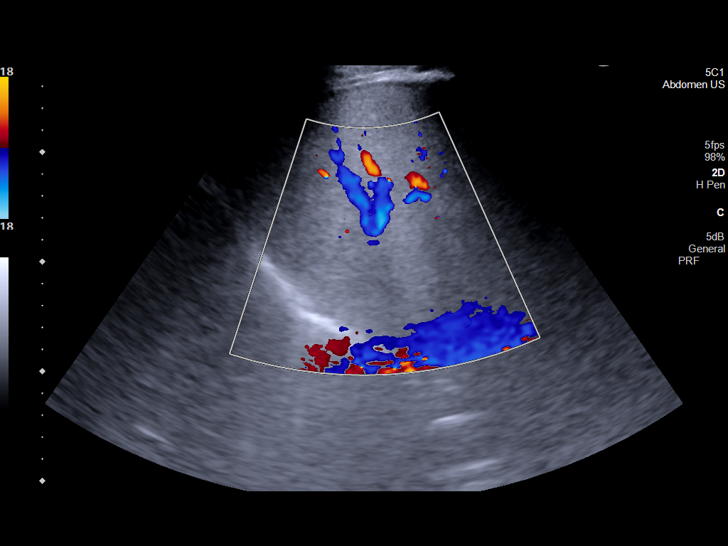
[im 89/97]
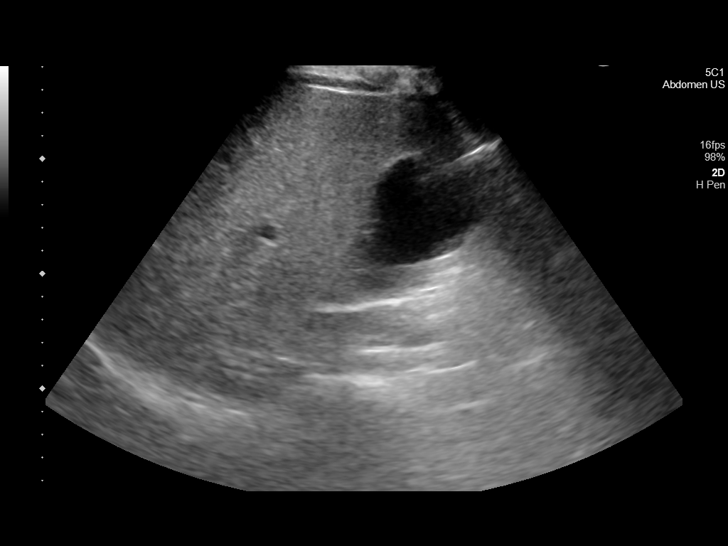
[im 97/97]
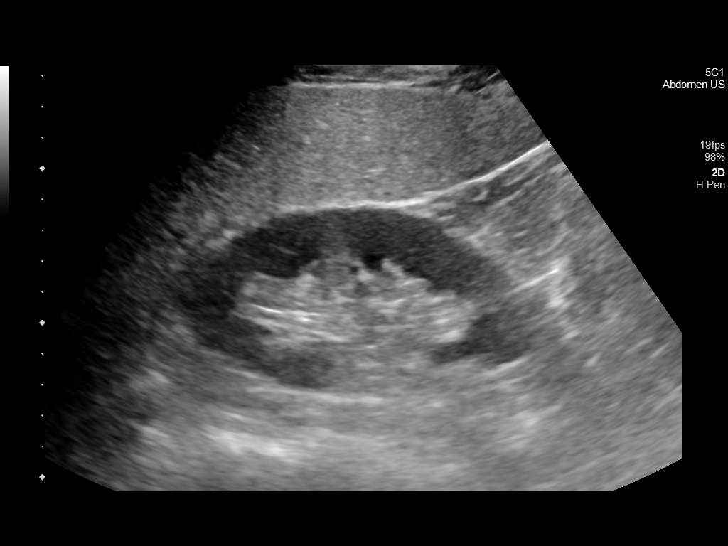

[14 of 25 positions shown; findings below may reference images not displayed]

FINDINGS: Gallbladder:

No gallstones or wall thickening visualized. No sonographic Murphy
sign noted by sonographer.

Common bile duct:

Diameter: 3 mm.  Where visualized, no filling defect.

Liver:

Diffusely increased echogenicity. Negative for mass. Portal vein is
patent on color Doppler imaging with normal direction of blood flow
towards the liver.
IMPRESSION: Hepatic steatosis.

Negative gallbladder.

## 2021-10-14 ENCOUNTER — Other Ambulatory Visit: Payer: Self-pay

## 2021-10-15 ENCOUNTER — Ambulatory Visit: Payer: No Typology Code available for payment source | Admitting: Gastroenterology

## 2021-10-19 ENCOUNTER — Ambulatory Visit: Payer: No Typology Code available for payment source | Admitting: Gastroenterology

## 2021-10-19 ENCOUNTER — Encounter: Payer: Self-pay | Admitting: Gastroenterology

## 2021-10-19 ENCOUNTER — Ambulatory Visit (INDEPENDENT_AMBULATORY_CARE_PROVIDER_SITE_OTHER): Payer: BC Managed Care – PPO | Admitting: Gastroenterology

## 2021-10-19 ENCOUNTER — Other Ambulatory Visit: Payer: Self-pay

## 2021-10-19 VITALS — BP 111/70 | HR 83 | Temp 98.3°F | Ht 71.0 in | Wt 170.8 lb

## 2021-10-19 DIAGNOSIS — K7031 Alcoholic cirrhosis of liver with ascites: Secondary | ICD-10-CM

## 2021-10-19 NOTE — Progress Notes (Signed)
Wyline Mood MD, MRCP(U.K) [address]  [address]  [place], [place] [address]  Main: [phone]  Fax: [phone]   Primary Care Physician: Farris Has, MD  Primary Gastroenterologist:  Dr. Wyline Mood   Chief Complaint  Patient presents with   Anemia    HPI: Andrew Cummings is a 41 y.o. male who was recently admitted to the hospital in 10/07/2021 with lower extremity edema and was seen by myself diagnosed with decompensated alcoholic liver cirrhosis with ascites complicated by acute kidney injury, SAAG gradient demonstrated ascites secondary to portal hypertension no SBP noted.  Doppler ultrasound no evidence of portal vein thrombosis.   10/05/2021 albumin 1.7 total bilirubin 4.4 creatinine 1.07 down from 2.64, INR 2.8, hemoglobin 7.5 g and MCV of 94.8.  10/01/2021 celiac serology, alpha-1 antitrypsin was normal.  Smooth muscle antibody was strongly positive at 59.  Antimitochondrial antibody was negative.  ANA negative.  Iron studies not performed ceruloplasmin low at 15.2 hepatitis B surface antigen nonreactive varicella-zoster PCR negative hepatitis B virus DNA not detected hepatitis A antibody nonreactive hepatitis C antibody nonreactive hepatitis B antigen, BE antibody, B core antibody nonreactive HIV nonreactive.  Has stopped drinking alcohol, no significant distension of abdomen , no nsaid use, on low salt diet , taking lactulose for regular bowel movements . No other complaints  Current Outpatient Medications  Medication Sig Dispense Refill   folic acid (FOLVITE) 1 MG tablet Take 1 tablet (1 mg total) by mouth daily. 90 tablet 1   furosemide (LASIX) 40 MG tablet Take 1 tablet (40 mg total) by mouth daily. 30 tablet 1   lactulose (CHRONULAC) 10 GM/15ML solution Take 15 mLs (10 g total) by mouth 2 (two) times daily. 236 mL 0   Multiple Vitamin (MULTIVITAMIN WITH MINERALS) TABS tablet Take 1 tablet by mouth daily. 90 tablet 1   spironolactone (ALDACTONE) 100 MG tablet  Take 1 tablet (100 mg total) by mouth daily. 30 tablet 1   No current facility-administered medications for this visit.    Allergies as of 10/19/2021   (No Known Allergies)    ROS:  General: Negative for anorexia, weight loss, fever, chills, fatigue, weakness. ENT: Negative for hoarseness, difficulty swallowing , nasal congestion. CV: Negative for chest pain, angina, palpitations, dyspnea on exertion, peripheral edema.  Respiratory: Negative for dyspnea at rest, dyspnea on exertion, cough, sputum, wheezing.  GI: See history of present illness. GU:  Negative for dysuria, hematuria, urinary incontinence, urinary frequency, nocturnal urination.  Endo: Negative for unusual weight change.    Physical Examination:   BP 111/70   Pulse 83   Temp 98.3 F (36.8 C) (Oral)   Ht 5\' 11"  (1.803 m)   Wt 170 lb 12.8 oz (77.5 kg)   BMI 23.82 kg/m   General: Well-nourished, well-developed in no acute distress.  Eyes: No icterus. Conjunctivae pink. Mouth: Oropharyngeal mucosa moist and pink , no lesions erythema or exudate. Abdomen: Bowel sounds are normal, nontender, mild distension , some free fluid not tense, no hepatosplenomegaly or masses, no abdominal bruits or hernia , no rebound or guarding.   Extremities: No lower extremity edema. No clubbing or deformities. Neuro: Alert and oriented x 3.  Grossly intact. Skin: Warm and dry, no jaundice.   Psych: Alert and cooperative, normal mood and affect.   Imaging Studies: Paracentesis  Result Date: 10/01/2021 INDICATION: Patient with acute liver failure and alcoholic liver cirrhosis with ascites request received for diagnostic and therapeutic paracentesis. EXAM: ULTRASOUND GUIDED  PARACENTESIS  MEDICATIONS: Local 1% lidocaine only. COMPLICATIONS: None immediate. PROCEDURE: Informed written consent was obtained from the patient after a discussion of the risks, benefits and alternatives to treatment. A timeout was performed prior to the  initiation of the procedure. Initial ultrasound scanning demonstrates a small to moderate amount of ascites within the right upper abdominal quadrant. The right upper abdomen was prepped and draped in the usual sterile fashion. 1% lidocaine was used for local anesthesia. Following this, a 19 gauge, 7-cm, Yueh catheter was introduced. An ultrasound image was saved for documentation purposes. The paracentesis was performed. The catheter was removed and a dressing was applied. The patient tolerated the procedure well without immediate post procedural complication. FINDINGS: A total of approximately 1.5 L of clear yellow fluid was removed. Samples were sent to the laboratory as requested by the clinical team. IMPRESSION: Successful ultrasound-guided paracentesis yielding 1.5 liters of peritoneal fluid. PLAN: If the patient eventually requires >/=2 paracenteses in a 30 day period, candidacy for formal evaluation by the [facility] will be assessed. This exam was performed by Tsosie Billing PA-C, and was supervised and interpreted by Dr. Anselm Pancoast. Electronically Signed   By: Markus Daft M.D.   On: 10/01/2021 16:49   US LIVER DOPPLER  Result Date: 10/01/2021 CLINICAL DATA:  Hepatic failure. EXAM: DUPLEX ULTRASOUND OF LIVER TECHNIQUE: Color and duplex Doppler ultrasound was performed to evaluate the hepatic in-flow and out-flow vessels. COMPARISON:  Right upper quadrant ultrasound earlier today. FINDINGS: Portal Vein Velocities Main:  26-43 cm/sec Right:  25 cm/sec Left:  14 cm/sec Hepatic Vein Velocities Right:  42 cm/sec Middle:  30 cm/sec Left:  40 cm/sec Hepatic Artery Velocity:  127 cm/sec Splenic Vein Velocity:  17 cm/sec Varices: None visualized. Ascites: Ascites present in the visualized peritoneal cavity. Splenomegaly with estimated splenic volume of at least 450 mL. Recanalized umbilical vein implicating underlying portal hypertension. Portal vein waveforms  demonstrate to and fro flow. No evidence of portal vein thrombus. Hepatic vein waveforms are somewhat pulsatile. This can be seen in the setting right heart failure but also in the setting of cirrhosis. Liver morphology is consistent with cirrhosis. IMPRESSION: 1. Evidence of portal hypertension with to and fro flow in the portal vein, recanalization of the umbilical vein and splenomegaly. No evidence of portal vein thrombosis. 2. Ascites in the visualized peritoneal cavity. 3. Cirrhosis of the liver. Electronically Signed   By: Aletta Edouard M.D.   On: 10/01/2021 16:00   [place] ASCITES (ABDOMEN LIMITED)  Result Date: 10/01/2021 CLINICAL DATA:  Abdominal distension. EXAM: LIMITED ABDOMEN ULTRASOUND FOR ASCITES TECHNIQUE: Limited ultrasound survey for ascites was performed in all four abdominal quadrants. COMPARISON:  None Available. FINDINGS: Moderate volume 4 quadrant abdominal ascites. IMPRESSION: Moderate volume abdominal ascites. Electronically Signed   By: Marijo Sanes M.D.   On: 10/01/2021 08:10   US Abdomen Limited RUQ (LIVER/GB)  Result Date: 10/01/2021 CLINICAL DATA:  Elevated liver function studies. EXAM: ULTRASOUND ABDOMEN LIMITED RIGHT UPPER QUADRANT COMPARISON:  Ultrasound 03/13/2021.  MRI success 14 2022 FINDINGS: Gallbladder: Markedly thickened gallbladder wall likely due to the surrounding ascites. No gallstones. No sonographic Murphy sign. Common bile duct: Diameter: 2.0 mm Liver: Cirrhotic changes involving the liver with a very irregular liver contour, dilated hepatic fissures and moderate volume ascites. The portal vein is patent but demonstrates reversed flow consistent with portal venous hypertension. Other: Ascites. IMPRESSION: 1. Cirrhotic changes involving the liver with reversed flow in the portal vein. No worrisome hepatic lesions or  intrahepatic biliary dilatation. Moderate volume abdominal ascites. 2. Markedly thickened gallbladder wall likely related to the surrounding ascites  and probable low albumin. No gallstones. Electronically Signed   By: Rudie Meyer M.D.   On: 10/01/2021 08:09    Assessment and Plan:   Keatyn Leonhardt is a 41 y.o. y/o male male with history of alcohol abuse presented to the emergency room in 09/2021  with edema weakness found to be in AKI possibly acute liver failure and anemia.  No overt blood loss noted I been consulted for anemia.  He has decompensated alcoholic liver cirrhosis with ascites complicated by acute kidney injury that improved with albumin .  Marland Kitchen  Acetic fluid SAG suggests secondary to portal hypertension no evidence of SBP.  Doppler ultrasound shows no evidence of portal vein thrombosis   Lab work is positive for F-actin antibody borderline low alpha-1 antitrypsin and low ceruloplasmin.   Plan  1.  F-actin strongly positive we will recheck, will recheck hepatitis B surface antibody and alpha-1 antitrypsin levels. 2.  24-hour urinary copper levels, recheck CBC, CMP, iron studies, INR 3.  Refer to ophthalmology to check for KF rings 4.  EGD to screen for esophageal varices 5.  Continue lactulose twice daily or 3 times daily for constipation 6. Stay off alcohol  7. Refer for transplant evaluation  I have discussed alternative options, risks & benefits,  which include, but are not limited to, bleeding, infection, perforation,respiratory complication & drug reaction.  The patient agrees with this plan & written consent will be obtained.        Dr Wyline Mood  MD,MRCP [facility]) Follow up in 4 weeks

## 2021-10-19 NOTE — Patient Instructions (Signed)
We will refer you to [facility] for a liver transplant consult. They will be contacting you after they review your chart.  Please schedule an appointment with an Ophthalmologist as soon as possible to rule out Andrew Cummings.

## 2021-10-20 DIAGNOSIS — K7031 Alcoholic cirrhosis of liver with ascites: Secondary | ICD-10-CM | POA: Diagnosis not present

## 2021-10-21 ENCOUNTER — Telehealth: Payer: Self-pay | Admitting: Gastroenterology

## 2021-10-21 MED ORDER — LACTULOSE 10 GM/15ML PO SOLN: 30.0000 g | Freq: Two times a day (BID) | ORAL | 11 refills | Status: DC

## 2021-10-21 NOTE — Addendum Note (Signed)
Addended by: Wayna Chalet on: 10/21/2021 04:43 PM   Modules accepted: Orders

## 2021-10-21 NOTE — Telephone Encounter (Signed)
Prescription, eye doctor recommendation

## 2021-10-23 DIAGNOSIS — G4700 Insomnia, unspecified: Secondary | ICD-10-CM | POA: Diagnosis not present

## 2021-10-23 DIAGNOSIS — Z09 Encounter for follow-up examination after completed treatment for conditions other than malignant neoplasm: Secondary | ICD-10-CM | POA: Diagnosis not present

## 2021-10-23 DIAGNOSIS — Z01818 Encounter for other preprocedural examination: Secondary | ICD-10-CM | POA: Diagnosis not present

## 2021-10-23 DIAGNOSIS — M25519 Pain in unspecified shoulder: Secondary | ICD-10-CM | POA: Diagnosis not present

## 2021-10-23 LAB — CBC WITH DIFFERENTIAL/PLATELET
Basophils Absolute: 0 10*3/uL (ref 0.0–0.2)
Basos: 0 %
EOS (ABSOLUTE): 0.2 10*3/uL (ref 0.0–0.4)
Eos: 3 %
Hematocrit: 21.2 % — ABNORMAL LOW (ref 37.5–51.0)
Hemoglobin: 7.8 g/dL — ABNORMAL LOW (ref 13.0–17.7)
Immature Grans (Abs): 0 10*3/uL (ref 0.0–0.1)
Immature Granulocytes: 0 %
Lymphocytes Absolute: 2.2 10*3/uL (ref 0.7–3.1)
Lymphs: 33 %
MCH: 31.5 pg (ref 26.6–33.0)
MCHC: 36.8 g/dL — ABNORMAL HIGH (ref 31.5–35.7)
MCV: 86 fL (ref 79–97)
Monocytes Absolute: 0.6 10*3/uL (ref 0.1–0.9)
Monocytes: 9 %
Neutrophils Absolute: 3.7 10*3/uL (ref 1.4–7.0)
Neutrophils: 55 %
Platelets: 109 10*3/uL — ABNORMAL LOW (ref 150–450)
RBC: 2.48 x10E6/uL — CL (ref 4.14–5.80)
RDW: 13.1 % (ref 11.6–15.4)
WBC: 6.7 10*3/uL (ref 3.4–10.8)

## 2021-10-23 LAB — ALPHA-1-ANTITRYPSIN: A-1 Antitrypsin: 170 mg/dL — ABNORMAL HIGH (ref 95–164)

## 2021-10-23 LAB — COMPREHENSIVE METABOLIC PANEL
ALT: 37 IU/L (ref 0–44)
AST: 108 IU/L — ABNORMAL HIGH (ref 0–40)
Albumin/Globulin Ratio: 0.5 — ABNORMAL LOW (ref 1.2–2.2)
Albumin: 2.5 g/dL — ABNORMAL LOW (ref 4.1–5.1)
Alkaline Phosphatase: 190 IU/L — ABNORMAL HIGH (ref 44–121)
BUN/Creatinine Ratio: 11 (ref 9–20)
BUN: 13 mg/dL (ref 6–20)
Bilirubin Total: 5.5 mg/dL — ABNORMAL HIGH (ref 0.0–1.2)
CO2: 23 mmol/L (ref 20–29)
Calcium: 8.3 mg/dL — ABNORMAL LOW (ref 8.7–10.2)
Chloride: 98 mmol/L (ref 96–106)
Creatinine, Ser: 1.22 mg/dL (ref 0.76–1.27)
Globulin, Total: 5.2 g/dL — ABNORMAL HIGH (ref 1.5–4.5)
Glucose: 106 mg/dL — ABNORMAL HIGH (ref 70–99)
Potassium: 4 mmol/L (ref 3.5–5.2)
Sodium: 135 mmol/L (ref 134–144)
Total Protein: 7.7 g/dL (ref 6.0–8.5)
eGFR: 77 mL/min/{1.73_m2} (ref >59)

## 2021-10-23 LAB — ANTI-SMOOTH MUSCLE ANTIBODY, IGG: Smooth Muscle Ab: 57 Units — ABNORMAL HIGH (ref 0–19)

## 2021-10-23 LAB — HGB FRACTIONATION CASCADE
Hgb A2: 2.5 % (ref 1.8–3.2)
Hgb A: 97.5 % (ref 96.4–98.8)
Hgb F: 0 % (ref 0.0–2.0)
Hgb S: 0 %

## 2021-10-23 LAB — COPPER, URINE - RANDOM OR 24 HOUR
Copper / Creatinine Ratio: 12 ug/g creat (ref 0–49)
Copper, 24H Ur: 15 ug/24 hr (ref 3–35)
Copper, Ur: 5 ug/L
Creatinine(Crt),U: 0.41 g/L (ref 0.30–3.00)

## 2021-10-23 LAB — IRON,TIBC AND FERRITIN PANEL
Ferritin: 577 ng/mL — ABNORMAL HIGH (ref 30–400)
Iron Saturation: 70 % — ABNORMAL HIGH (ref 15–55)
Iron: 85 ug/dL (ref 38–169)
Total Iron Binding Capacity: 122 ug/dL — CL (ref 250–450)
UIBC: 37 ug/dL — ABNORMAL LOW (ref 111–343)

## 2021-10-23 LAB — HEPATITIS B SURFACE ANTIBODY,QUALITATIVE: Hep B Surface Ab, Qual: NONREACTIVE

## 2021-10-23 LAB — PROTIME-INR
INR: 2 — ABNORMAL HIGH (ref 0.9–1.2)
Prothrombin Time: 20.3 s — ABNORMAL HIGH (ref 9.1–12.0)

## 2021-10-23 LAB — HEPATITIS A ANTIBODY, TOTAL: hep A Total Ab: POSITIVE — AB

## 2021-10-23 LAB — CERULOPLASMIN: Ceruloplasmin: 16.2 mg/dL (ref 16.0–31.0)

## 2021-10-23 LAB — B12 AND FOLATE PANEL
Folate: >20 ng/mL (ref >3.0)
Vitamin B-12: 1938 pg/mL — ABNORMAL HIGH (ref 232–1245)

## 2021-10-27 DIAGNOSIS — H02051 Trichiasis without entropian right upper eyelid: Secondary | ICD-10-CM | POA: Diagnosis not present

## 2021-10-30 ENCOUNTER — Other Ambulatory Visit: Payer: Self-pay | Admitting: Gastroenterology

## 2021-10-30 ENCOUNTER — Ambulatory Visit: Payer: BC Managed Care – PPO | Admitting: General Practice

## 2021-10-30 ENCOUNTER — Other Ambulatory Visit: Payer: Self-pay

## 2021-10-30 ENCOUNTER — Ambulatory Visit
Admission: RE | Admit: 2021-10-30 | Discharge: 2021-10-30 | Disposition: A | Discharge status: Home / Self Care | Payer: BC Managed Care – PPO | Source: Ambulatory Visit | Attending: Gastroenterology | Admitting: Gastroenterology

## 2021-10-30 ENCOUNTER — Encounter
Admission: RE | Disposition: A | Discharge status: Home / Self Care | Payer: Self-pay | Source: Ambulatory Visit | Attending: Gastroenterology

## 2021-10-30 DIAGNOSIS — K7460 Unspecified cirrhosis of liver: Secondary | ICD-10-CM | POA: Diagnosis not present

## 2021-10-30 DIAGNOSIS — Z87891 Personal history of nicotine dependence: Secondary | ICD-10-CM | POA: Diagnosis not present

## 2021-10-30 DIAGNOSIS — K7031 Alcoholic cirrhosis of liver with ascites: Secondary | ICD-10-CM

## 2021-10-30 DIAGNOSIS — I8510 Secondary esophageal varices without bleeding: Secondary | ICD-10-CM | POA: Diagnosis not present

## 2021-10-30 HISTORY — PX: ESOPHAGOGASTRODUODENOSCOPY (EGD) WITH PROPOFOL: SHX5813

## 2021-10-30 SURGERY — ESOPHAGOGASTRODUODENOSCOPY (EGD) WITH PROPOFOL
Anesthesia: General

## 2021-10-30 MED ORDER — ONDANSETRON HCL 4 MG/2ML IJ SOLN
INTRAMUSCULAR | Status: AC
  Administered 2021-10-30: 4 mg
  Filled 2021-10-30: qty 2

## 2021-10-30 MED ORDER — PROPOFOL 10 MG/ML IV BOLUS
INTRAVENOUS | Status: DC | PRN
  Administered 2021-10-30: 100 mg via INTRAVENOUS
  Administered 2021-10-30 (×2): 40 mg via INTRAVENOUS

## 2021-10-30 MED ORDER — OMEPRAZOLE MAGNESIUM 20 MG PO TBEC: 40.0000 mg | DELAYED_RELEASE_TABLET | Freq: Every day | ORAL | 2 refills | Status: DC

## 2021-10-30 MED ORDER — LIDOCAINE HCL (CARDIAC) PF 100 MG/5ML IV SOSY
PREFILLED_SYRINGE | INTRAVENOUS | Status: DC | PRN
  Administered 2021-10-30: 100 mg via INTRAVENOUS

## 2021-10-30 MED ORDER — SODIUM CHLORIDE 0.9 % IV SOLN: INTRAVENOUS | Status: DC

## 2021-10-30 MED ORDER — FENTANYL CITRATE PF 50 MCG/ML IJ SOSY
PREFILLED_SYRINGE | INTRAMUSCULAR | Status: AC
  Administered 2021-10-30: 25 ug
  Filled 2021-10-30: qty 1

## 2021-10-30 NOTE — Anesthesia Postprocedure Evaluation (Signed)
Anesthesia Post Note  Patient: Andrew Cummings  Procedure(s) Performed: ESOPHAGOGASTRODUODENOSCOPY (EGD) WITH PROPOFOL  Patient location during evaluation: Endoscopy Anesthesia Type: General Level of consciousness: awake and alert Pain management: pain level controlled Vital Signs Assessment: post-procedure vital signs reviewed and stable Respiratory status: spontaneous breathing, nonlabored ventilation, respiratory function stable and patient connected to nasal cannula oxygen Cardiovascular status: blood pressure returned to baseline and stable Postop Assessment: no apparent nausea or vomiting Anesthetic complications: no   No notable events documented.   Last Vitals:  Vitals:   10/30/21 1019 10/30/21 1029  BP: 111/64 112/73  Pulse: 81   Resp: 19   Temp: (!) 35.9 C   SpO2: 100%     Last Pain:  Vitals:   10/30/21 1029  TempSrc:   PainSc: [place]

## 2021-10-30 NOTE — OR Nursing (Signed)
Pt experiencing chest and throat discomfort after banding along with nausea/belching.Dr Barbra Sarks notified. Md orders zofran 4mg  iv and fentanyl 25mg  iv once with relief at 1115

## 2021-10-30 NOTE — Op Note (Signed)
[facility] Patient Name: Andrew Cummings Procedure Date: 10/30/2021 9:55 AM MRN: [id number] Account #: [id number] Date of Birth: 13-Jun-1980 Admit Type: Outpatient Age: 41 Room: [facility] ENDO ROOM 3 Gender: Male Note Status: Finalized Instrument Name: Michaelle Birks [id number] Procedure:             Upper GI endoscopy Indications:           Cirrhosis rule out esophageal varices Providers:             Jonathon Bellows MD, MD Referring MD:          London Pepper (Referring MD) Medicines:             Monitored Anesthesia Care Complications:         No immediate complications. Procedure:             Pre-Anesthesia Assessment:                        - Prior to the procedure, a History and Physical was                         performed, and patient medications, allergies and                         sensitivities were reviewed. The patient's tolerance                         of previous anesthesia was reviewed.                        - The risks and benefits of the procedure and the                         sedation options and risks were discussed with the                         patient. All questions were answered and informed                         consent was obtained.                        - After reviewing the risks and benefits, the patient                         was deemed in satisfactory condition to undergo the                         procedure.                        - ASA Grade Assessment: III - A patient with severe                         systemic disease.                        After obtaining informed consent, the endoscope was                         passed under direct vision.  Throughout the procedure,                         the patient's blood pressure, pulse, and oxygen                         saturations were monitored continuously. The Endoscope                         was introduced through the mouth, and advanced to the                          third part of duodenum. The upper GI endoscopy was                         accomplished with ease. The patient tolerated the                         procedure well. Findings:      Grade III varices were found in the lower third of the esophagus. They       were large in size. Three bands were successfully placed with incomplete       eradication of varices. There was no bleeding during and at the end of       the procedure.      A medium amount of food (residue) was found on the greater curvature of       the stomach.      The examined duodenum was normal. Impression:            - Grade III esophageal varices. Incompletely                         eradicated. Banded.                        - A medium amount of food (residue) in the stomach.                        - Normal examined duodenum.                        - No specimens collected. Recommendation:        - Discharge patient to home (with escort).                        - Clear liquid diet today.                        - Continue present medications.                        - Repeat upper endoscopy in 4 weeks to evaluate the                         response to therapy. Procedure Code(s):     --- Professional ---                        [phone], Esophagogastroduodenoscopy, flexible,  transoral; with band ligation of esophageal/gastric                         varices Diagnosis Code(s):     --- Professional ---                        K74.60, Unspecified cirrhosis of liver                        I85.10, Secondary esophageal varices without bleeding CPT copyright 2019 American Medical Association. All rights reserved. The codes documented in this report are preliminary and upon coder review may  be revised to meet current compliance requirements. Jonathon Bellows, MD Jonathon Bellows MD, MD 10/30/2021 10:18:13 AM This report has been signed electronically. Number of Addenda: 0 Note Initiated On: 10/30/2021 9:55  AM Estimated Blood Loss:  Estimated blood loss: none.      [facility]

## 2021-10-30 NOTE — Anesthesia Preprocedure Evaluation (Signed)
Anesthesia Evaluation  Patient identified by MRN, date of birth, ID band Patient awake    Reviewed: Allergy & Precautions, NPO status , Patient's Chart, lab work & pertinent test results  Airway Mallampati: II  TM Distance: >3 FB Neck ROM: full    Dental  (+) Chipped, Dental Advidsory Given   Pulmonary neg pulmonary ROS, neg shortness of breath, neg COPD, former smoker,    Pulmonary exam normal        Cardiovascular (-) anginanegative cardio ROS Normal cardiovascular exam     Neuro/Psych negative neurological ROS  negative psych ROS   GI/Hepatic negative GI ROS, Neg liver ROS,   Endo/Other  negative endocrine ROS  Renal/GU negative Renal ROS  negative genitourinary   Musculoskeletal   Abdominal   Peds  Hematology negative hematology ROS (+)   Anesthesia Other Findings Past Medical History: No date: Alcohol abuse No date: History of closed shoulder dislocation     Comment:  bilateral  No past surgical history on file.  BMI    Body Mass Index: 24.41 kg/m      Reproductive/Obstetrics negative OB ROS                             Anesthesia Physical Anesthesia Plan  ASA: 2  Anesthesia Plan: General   Post-op Pain Management: Minimal or no pain anticipated   Induction: Intravenous  PONV Risk Score and Plan: 3 and Propofol infusion, TIVA and Ondansetron  Airway Management Planned: Nasal Cannula  Additional Equipment: None  Intra-op Plan:   Post-operative Plan:   Informed Consent: I have reviewed the patients History and Physical, chart, labs and discussed the procedure including the risks, benefits and alternatives for the proposed anesthesia with the patient or authorized representative who has indicated his/her understanding and acceptance.     Dental advisory given  Plan Discussed with: CRNA and Surgeon  Anesthesia Plan Comments: (Discussed risks of anesthesia with  patient, including possibility of difficulty with spontaneous ventilation under anesthesia necessitating airway intervention, PONV, and rare risks such as cardiac or respiratory or neurological events, and allergic reactions. Discussed the role of CRNA in patient's perioperative care. Patient understands.)        Anesthesia Quick Evaluation

## 2021-10-30 NOTE — Transfer of Care (Signed)
Immediate Anesthesia Transfer of Care Note  Patient: Andrew Cummings  Procedure(s) Performed: ESOPHAGOGASTRODUODENOSCOPY (EGD) WITH PROPOFOL  Patient Location: Endoscopy Unit  Anesthesia Type:General  Level of Consciousness: drowsy  Airway & Oxygen Therapy: Patient Spontanous Breathing and Patient connected to nasal cannula oxygen  Post-op Assessment: Report given to RN, Post -op Vital signs reviewed and stable and Patient moving all extremities  Post vital signs: Reviewed and stable  Last Vitals:  Vitals Value Taken Time  BP 111/64 10/30/21 1019  Temp 35.9 C 10/30/21 1019  Pulse 82 10/30/21 1019  Resp 19 10/30/21 1019  SpO2 100 % 10/30/21 1019  Vitals shown include unvalidated device data.  Last Pain:  Vitals:   10/30/21 1019  TempSrc: Temporal      Patients Stated Pain Goal: 0 (24/09/73 5329)  Complications: No notable events documented.

## 2021-10-30 NOTE — H&P (Signed)
Jonathon Bellows, MD [address] [place], [place], [place], [address] [address], [place], [place], [place], [address] Phone: [phone]  Fax: [phone]  Primary Care Physician:  London Pepper, MD   Pre-Procedure History & Physical: HPI:  Andrew Cummings is a 41 y.o. male is here for an endoscopy    Past Medical History:  Diagnosis Date   Alcohol abuse    History of closed shoulder dislocation    bilateral    No past surgical history on file.  Prior to Admission medications   Medication Sig Start Date End Date Taking? Authorizing Provider  folic acid (FOLVITE) 1 MG tablet Take 1 tablet (1 mg total) by mouth daily. 10/05/21  Yes Wouk, Ailene Rud, MD  furosemide (LASIX) 40 MG tablet Take 1 tablet (40 mg total) by mouth daily. 10/05/21  Yes Wouk, Ailene Rud, MD  lactulose (CHRONULAC) 10 GM/15ML solution Take 45 mLs (30 g total) by mouth 2 (two) times daily. 10/21/21  Yes Jonathon Bellows, MD  Multiple Vitamin (MULTIVITAMIN WITH MINERALS) TABS tablet Take 1 tablet by mouth daily. 10/05/21  Yes Wouk, Ailene Rud, MD  spironolactone (ALDACTONE) 100 MG tablet Take 1 tablet (100 mg total) by mouth daily. 10/05/21  Yes Wouk, Ailene Rud, MD    Allergies as of 10/19/2021   (No Known Allergies)    No family history on file.  Social History   Socioeconomic History   Marital status: Married    Spouse name: Not on file   Number of children: Not on file   Years of education: Not on file   Highest education level: Not on file  Occupational History   Not on file  Tobacco Use   Smoking status: Former    Types: Cigarettes   Smokeless tobacco: Never  Vaping Use   Vaping Use: Never used  Substance and Sexual Activity   Alcohol use: Yes    Comment: excessive drinking on weekends, 1 drink a day on week days   Drug use: Not Currently   Sexual activity: Not on file  Other Topics Concern   Not on file  Social History Narrative   Not on file   Social Determinants of  Health   Financial Resource Strain: Not on file  Food Insecurity: No Food Insecurity (10/01/2021)   Hunger Vital Sign    Worried About Running Out of Food in the Last Year: Never true    Ran Out of Food in the Last Year: Never true  Transportation Needs: No Transportation Needs (10/01/2021)   PRAPARE - Hydrologist (Medical): No    Lack of Transportation (Non-Medical): No  Physical Activity: Not on file  Stress: Not on file  Social Connections: Not on file  Intimate Partner Violence: Not At Risk (10/01/2021)   Humiliation, Afraid, Rape, and Kick questionnaire    Fear of Current or Ex-Partner: No    Emotionally Abused: No    Physically Abused: No    Sexually Abused: No    Review of Systems: See HPI, otherwise negative ROS  Physical Exam: BP 121/73   Pulse 73   Temp (!) 96.4 F (35.8 C) (Temporal)   Resp 16   Ht 5\' 11"  (1.803 m)   Wt 79.4 kg   SpO2 100%   BMI 24.41 kg/m  General:   Alert,  pleasant and cooperative in NAD Head:  Normocephalic and atraumatic. Neck:  Supple; no masses or thyromegaly. Lungs:  Clear throughout to auscultation, normal  respiratory effort.    Heart:  +S1, +S2, Regular rate and rhythm, No edema. Abdomen:  Soft, nontender and nondistended. Normal bowel sounds, without guarding, and without rebound.   Neurologic:  Alert and  oriented x4;  grossly normal neurologically.  Impression/Plan: Andrew Cummings is here for an endoscopy  to be performed for  evaluation of esophageal varices    Risks, benefits, limitations, and alternatives regarding endoscopy have been reviewed with the patient.  Questions have been answered.  All parties agreeable.   Jonathon Bellows, MD  10/30/2021, 9:59 AM

## 2021-11-02 ENCOUNTER — Telehealth: Payer: Self-pay

## 2021-11-02 ENCOUNTER — Other Ambulatory Visit: Payer: Self-pay

## 2021-11-02 ENCOUNTER — Encounter: Payer: Self-pay | Admitting: Gastroenterology

## 2021-11-02 DIAGNOSIS — I8510 Secondary esophageal varices without bleeding: Secondary | ICD-10-CM

## 2021-11-02 NOTE — Telephone Encounter (Signed)
Patient has been advised per Dr. Vicente Males, "He should discuss with his surgeon- his liver functions although better than before are still very abnormal and depending on the type of surgery risk can be different".  Patient verbalized understanding and will discuss with his surgeon.   Thanks,  [place], [place]

## 2021-11-02 NOTE — Telephone Encounter (Signed)
Patient has been contacted to schedule repeat EGD with Dr. Vicente Males.  EGD has been scheduled for Tuesday 11/24/21 at [facility].  Patiet has been advised to do Clear Liquid Diet the day before EGD.  Instructions will be mailed and sent via mychart.  Patient would like to know if he can proceed with his shoulder surgery.  No date has been set for shoulder surgery at this time.  Thanks,  [place], [place]

## 2021-11-16 ENCOUNTER — Other Ambulatory Visit: Payer: Self-pay

## 2021-11-16 NOTE — Progress Notes (Signed)
Opened in error.  Mervyn Pflaum, CMA 

## 2021-11-17 ENCOUNTER — Ambulatory Visit: Payer: BC Managed Care – PPO | Admitting: Gastroenterology

## 2021-11-17 NOTE — Progress Notes (Deleted)
Wyline Mood MD, MRCP(U.K) [address]  [address]  [place], [place] [address]  Main: [phone]  Fax: [phone]   Primary Care Physician: Farris Has, MD  Primary Gastroenterologist:  Dr. Wyline Mood   No chief complaint on file.   HPI: Andrew Cummings is a 41 y.o. male   Summary of history :   Andrew Cummings is a 41 y.o. male who was recently admitted to the hospital in 10/07/2021 with lower extremity edema and was seen by myself diagnosed with decompensated alcoholic liver cirrhosis with ascites complicated by acute kidney injury, SAAG gradient demonstrated ascites secondary to portal hypertension no SBP noted.  Doppler ultrasound no evidence of portal vein thrombosis.     10/05/2021 albumin 1.7 total bilirubin 4.4 creatinine 1.07 down from 2.64, INR 2.8, hemoglobin 7.5 g and MCV of 94.8.   10/01/2021 celiac serology, alpha-1 antitrypsin was normal.  Smooth muscle antibody was strongly positive at 59.  Antimitochondrial antibody was negative.  ANA negative.  Iron studies not performed ceruloplasmin low at 15.2 hepatitis B surface antigen nonreactive varicella-zoster PCR negative hepatitis B virus DNA not detected hepatitis A antibody nonreactive hepatitis C antibody nonreactive hepatitis B antigen, BE antibody, B core antibody nonreactive HIV nonreactive.   Has stopped drinking alcohol, no significant distension of abdomen , no nsaid use, on low salt diet , taking lactulose for regular bowel movements . No other complaints  Interval history   10/19/2021-11/17/2021  10/30/2021: EGD: esophageal varices large banded x 3  10/20/2021: 24 hour urinary copper , ceruloplasmin normal.  Alpha 1 at- normal . Smooth muscle ab - elevated 57 . INR 2.0 . Normal hemoglobin electrophoresis.  Creatinine 1.22 total bilirubin 5.5 iron studies iron saturation of 70 and ferritin of 577 and hemoglobin 7.8 g hepatitis B surface antibody nonreactive hepatitis A total antibody  positive  Current Outpatient Medications  Medication Sig Dispense Refill   atovaquone-proguanil (MALARONE) 250-100 MG TABS tablet Take 1 tablet by mouth daily.     folic acid (FOLVITE) 1 MG tablet Take 1 tablet (1 mg total) by mouth daily. 90 tablet 1   furosemide (LASIX) 40 MG tablet Take 1 tablet (40 mg total) by mouth daily. 30 tablet 1   lactulose (CHRONULAC) 10 GM/15ML solution Take 45 mLs (30 g total) by mouth 2 (two) times daily. 2730 mL 11   Multiple Vitamin (MULTIVITAMIN WITH MINERALS) TABS tablet Take 1 tablet by mouth daily. 90 tablet 1   omeprazole (PRILOSEC OTC) 20 MG tablet Take 2 tablets (40 mg total) by mouth daily. 60 tablet 2   omeprazole (PRILOSEC) 20 MG capsule Take by mouth daily.     ondansetron (ZOFRAN) 8 MG tablet Take 8 mg by mouth daily as needed.     spironolactone (ALDACTONE) 100 MG tablet Take 1 tablet (100 mg total) by mouth daily. 30 tablet 1   traZODone (DESYREL) 50 MG tablet Take 25-50 mg by mouth at bedtime as needed.     No current facility-administered medications for this visit.    Allergies as of 11/17/2021   (No Known Allergies)    ROS:  General: Negative for anorexia, weight loss, fever, chills, fatigue, weakness. ENT: Negative for hoarseness, difficulty swallowing , nasal congestion. CV: Negative for chest pain, angina, palpitations, dyspnea on exertion, peripheral edema.  Respiratory: Negative for dyspnea at rest, dyspnea on exertion, cough, sputum, wheezing.  GI: See history of present illness. GU:  Negative for dysuria, hematuria, urinary incontinence, urinary frequency, nocturnal urination.  Endo: Negative  for unusual weight change.    Physical Examination:   There were no vitals taken for this visit.  General: Well-nourished, well-developed in no acute distress.  Eyes: No icterus. Conjunctivae pink. Mouth: Oropharyngeal mucosa moist and pink , no lesions erythema or exudate. Lungs: Clear to auscultation bilaterally.  Non-labored. Heart: Regular rate and rhythm, no murmurs rubs or gallops.  Abdomen: Bowel sounds are normal, nontender, nondistended, no hepatosplenomegaly or masses, no abdominal bruits or hernia , no rebound or guarding.   Extremities: No lower extremity edema. No clubbing or deformities. Neuro: Alert and oriented x 3.  Grossly intact. Skin: Warm and dry, no jaundice.   Psych: Alert and cooperative, normal mood and affect.   Imaging Studies: No results found.  Assessment and Plan:   Andrew Cummings is a 41 y.o. y/o male with history of alcohol abuse presented to the emergency room in 09/2021  with edema weakness found to be in AKI possibly acute liver failure and anemia.  .  He haddecompensated alcoholic liver cirrhosis with ascites complicated by acute kidney injury that improved with albumin .  Ascetic fluid SAG suggests secondary to portal hypertension no evidence of SBP.  Doppler ultrasound showed no evidence of portal vein thrombosis.  Smooth muscle antibody positive     Plan   1.  Smooth muscle antibody positive likely will require liver biopsy showed 2.  Needs hepatitis B vaccine immune to hepatitis A 3.  Iron percentage saturation and ferritin elevated will obtain hemochromatosis gene testing 4.  EGD esophageal varices will continue to band daily eradication 5.  Continue lactulose twice daily or 3 times daily for constipation 6. Stay off alcohol  7.  He has been referred for transplant evaluation to [facility]    Dr Jonathon Bellows  MD,MRCP [facility]) Follow up in ***

## 2021-11-18 NOTE — Progress Notes (Signed)
1. He no showed for his appointment yesterday  2 . Needs hep B vaccine  3. Smooth muscle antibody is positive may have a second reason for his liver disease would have him discuss at [facility] if he needs treatment for the same  4. Check HFE gene mutation

## 2021-11-23 ENCOUNTER — Encounter: Payer: Self-pay | Admitting: Gastroenterology

## 2021-11-24 ENCOUNTER — Ambulatory Visit
Admission: RE | Admit: 2021-11-24 | Payer: BC Managed Care – PPO | Source: Ambulatory Visit | Admitting: Gastroenterology

## 2021-11-24 ENCOUNTER — Encounter: Payer: Self-pay | Admitting: Certified Registered"

## 2021-11-24 ENCOUNTER — Encounter: Admission: RE | Payer: Self-pay | Source: Ambulatory Visit

## 2021-11-24 SURGERY — ESOPHAGOGASTRODUODENOSCOPY (EGD) WITH PROPOFOL
Anesthesia: General

## 2021-11-24 NOTE — Anesthesia Preprocedure Evaluation (Signed)
Anesthesia Evaluation  Patient identified by MRN, date of birth, ID band Patient awake    Reviewed: Allergy & Precautions, H&P , NPO status , Patient's Chart, lab work & pertinent test results, reviewed documented beta blocker date and time   Airway Mallampati: II   Neck ROM: full    Dental  (+) Poor Dentition   Pulmonary neg pulmonary ROS, former smoker   Pulmonary exam normal        Cardiovascular negative cardio ROS Normal cardiovascular exam Rhythm:regular Rate:Normal     Neuro/Psych Seizures -, Well Controlled,   negative psych ROS   GI/Hepatic negative GI ROS, Neg liver ROS,,,  Endo/Other  negative endocrine ROS    Renal/GU Renal disease  negative genitourinary   Musculoskeletal   Abdominal   Peds  Hematology  (+) Blood dyscrasia, anemia   Anesthesia Other Findings Past Medical History: No date: Alcohol abuse No date: History of closed shoulder dislocation     Comment:  bilateral Past Surgical History: 10/30/2021: ESOPHAGOGASTRODUODENOSCOPY (EGD) WITH PROPOFOL; N/A     Comment:  Procedure: ESOPHAGOGASTRODUODENOSCOPY (EGD) WITH               PROPOFOL;  Surgeon: Jonathon Bellows, MD;  Location: [facility]               ENDOSCOPY;  Service: Gastroenterology;  Laterality: N/A;   Reproductive/Obstetrics negative OB ROS                             Anesthesia Physical Anesthesia Plan  ASA: 3  Anesthesia Plan: General   Post-op Pain Management:    Induction:   PONV Risk Score and Plan:   Airway Management Planned:   Additional Equipment:   Intra-op Plan:   Post-operative Plan:   Informed Consent: I have reviewed the patients History and Physical, chart, labs and discussed the procedure including the risks, benefits and alternatives for the proposed anesthesia with the patient or authorized representative who has indicated his/her understanding and acceptance.     Dental Advisory  Given  Plan Discussed with: CRNA  Anesthesia Plan Comments:        Anesthesia Quick Evaluation

## 2021-11-25 ENCOUNTER — Telehealth: Payer: Self-pay

## 2021-11-25 DIAGNOSIS — K7031 Alcoholic cirrhosis of liver with ascites: Secondary | ICD-10-CM

## 2021-11-25 DIAGNOSIS — I8510 Secondary esophageal varices without bleeding: Secondary | ICD-10-CM

## 2021-11-25 NOTE — Telephone Encounter (Signed)
-----   Message from Wyline Mood, MD sent at 11/18/2021 10:03 AM EDT ----- 1. He no showed for his appointment yesterday  2 . Needs hep B vaccine  3. Smooth muscle antibody is positive may have a second reason for his liver disease would have [facility] discuss at [facility] if he needs treatment for the same  4. Check HFE gene mutation

## 2021-11-25 NOTE — Telephone Encounter (Signed)
Dr. Tobi Bastos, I have called patient and he still doesn't answer my calls. I will send him a letter today. I also noticed that he cancelled his procedure with you yesterday. What do you want me to do?

## 2021-11-25 NOTE — Telephone Encounter (Signed)
Dr. Tobi Bastos, I looked online for the appropriate lab from [facility]'s website for this specific code and this is what it said. Is this the right lab? There's one that shows in our system but but it does not have [facility] as the resulting agent, it stated other. Or do you want him to be tested when he gets to be seen by [facility]? Dr. Tobi Bastos, I also wanted to let you know that I looked into Care Everywhere and saw that patient still did not  have an appointment with [facility]. Therefore, I gave them a call to ask if they have reached out to the patient and I was told that they have 3 times with no luck. They stated that they had called him, sent a text message and a letter and no response. I then called patient and he did not answer my call. I left him the [facility] number to call them as soon as possible to schedule an appointment. I have also sent him a MyChart patient message as he is active. We will see where this goes.

## 2021-12-02 ENCOUNTER — Other Ambulatory Visit: Payer: Self-pay | Admitting: Obstetrics and Gynecology

## 2021-12-07 NOTE — Telephone Encounter (Signed)
Called patient and he did not answer. I was not able to leave him a voicemail since it's still not set-up.

## 2022-06-27 IMAGING — US US ABDOMEN LIMITED
2 series · 13 of 25 positions shown · non-contrast
Comparison: Right upper quadrant ultrasound 06/29/2020, MRI abdomen
07/01/2020

CLINICAL DATA: Elevated liver enzymes.

EXAM:
ULTRASOUND ABDOMEN LIMITED RIGHT UPPER QUADRANT

[Series 1: us abdomen limited · 0.26mm/px · 12 of 42 slices shown (1 of 2)]
[im 1/42]
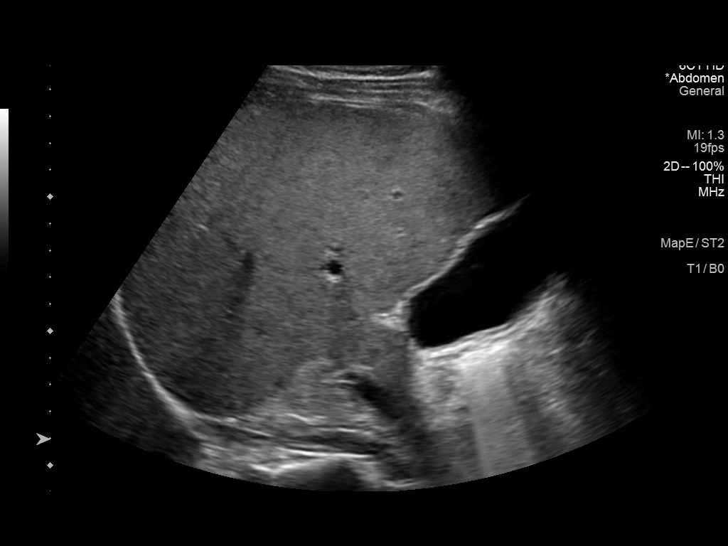
[im 4/42]
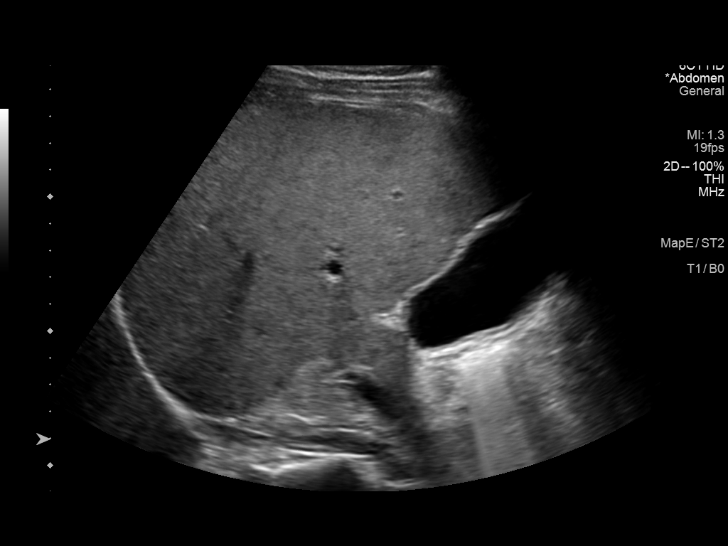
[im 8/42]
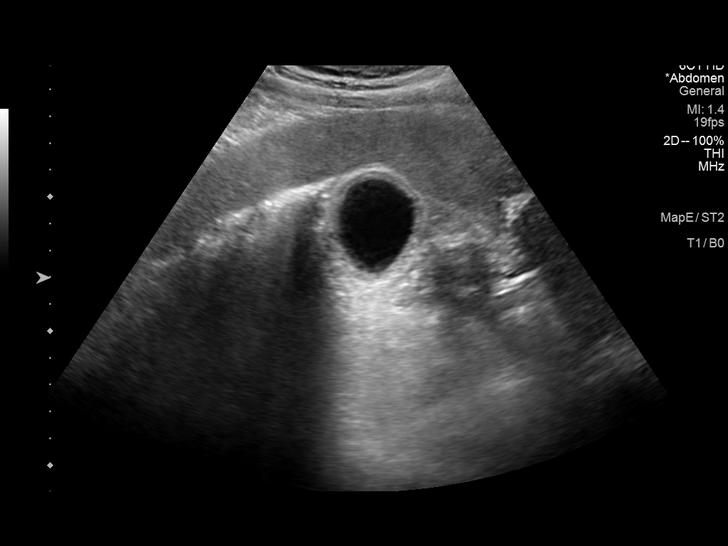
[im 11/42]
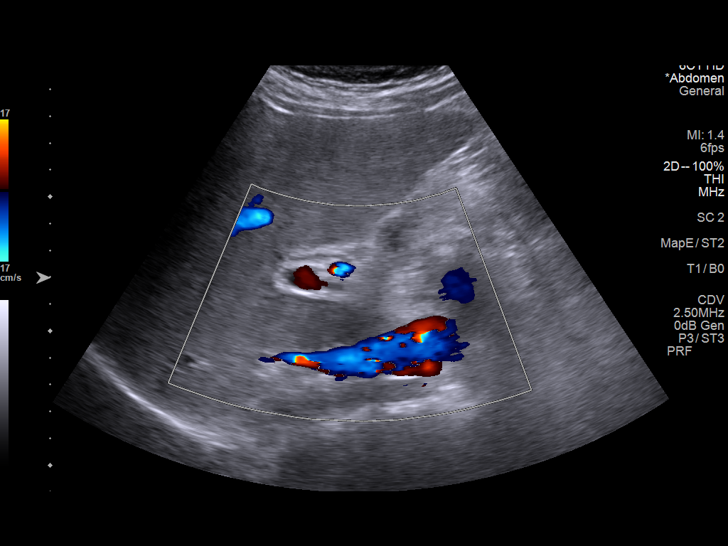
[im 15/42]
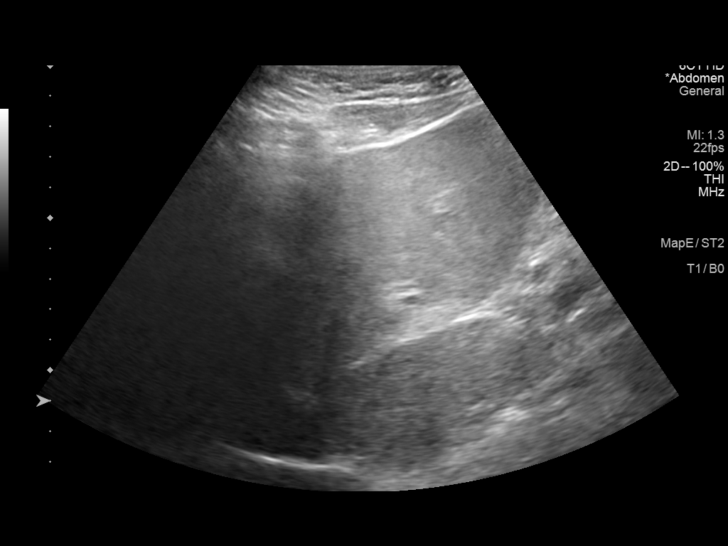
[im 18/42]
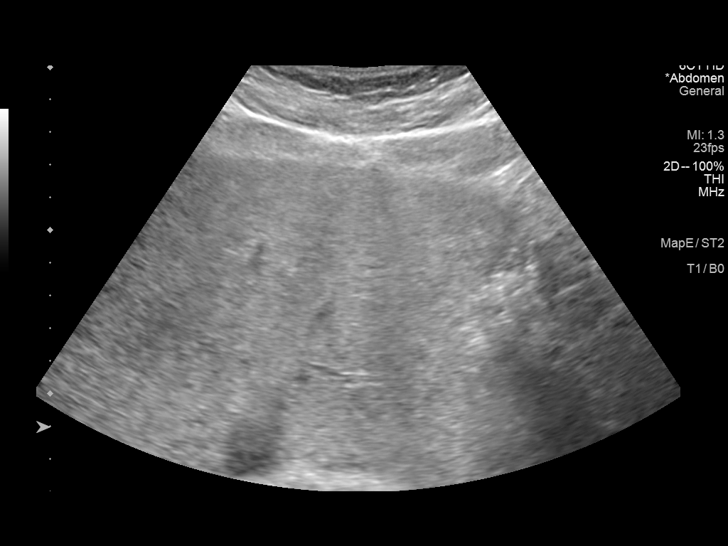
[im 22/42]
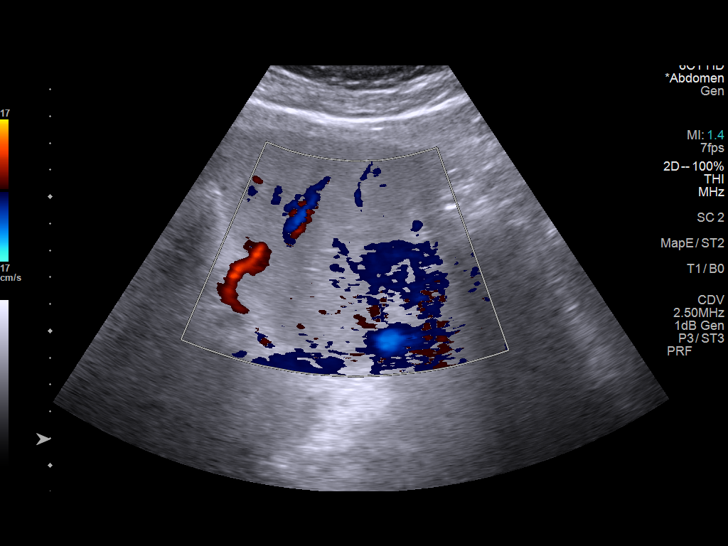
[im 25/42]
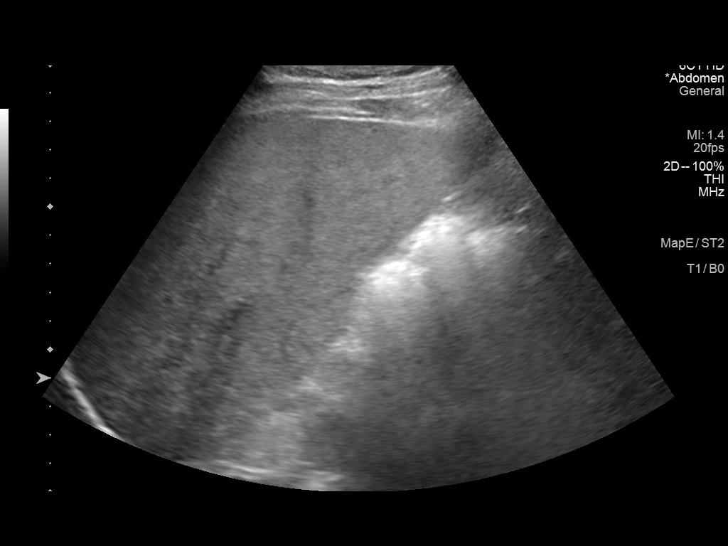
[im 29/42]
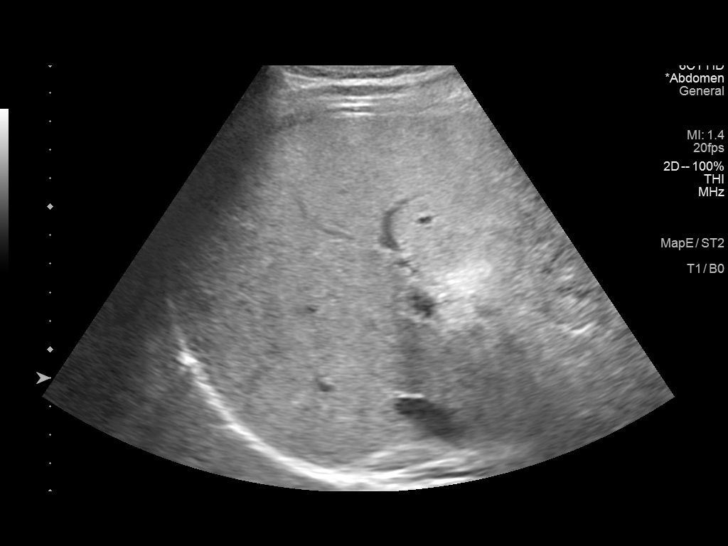
[im 33/42]
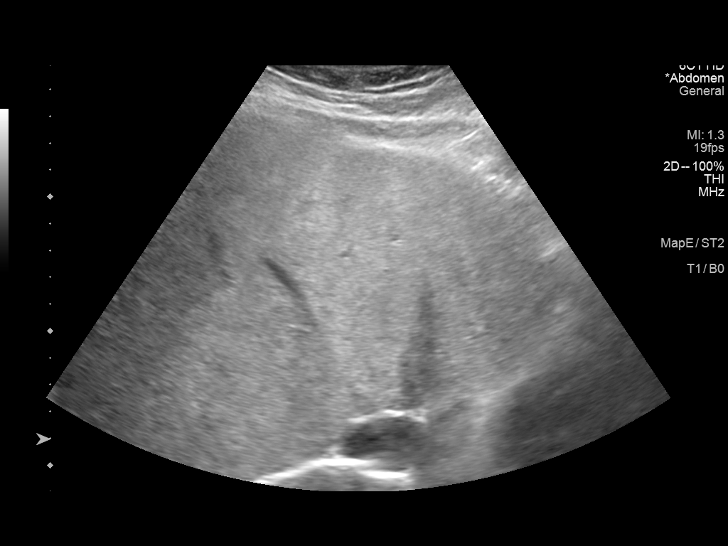
[im 36/42]
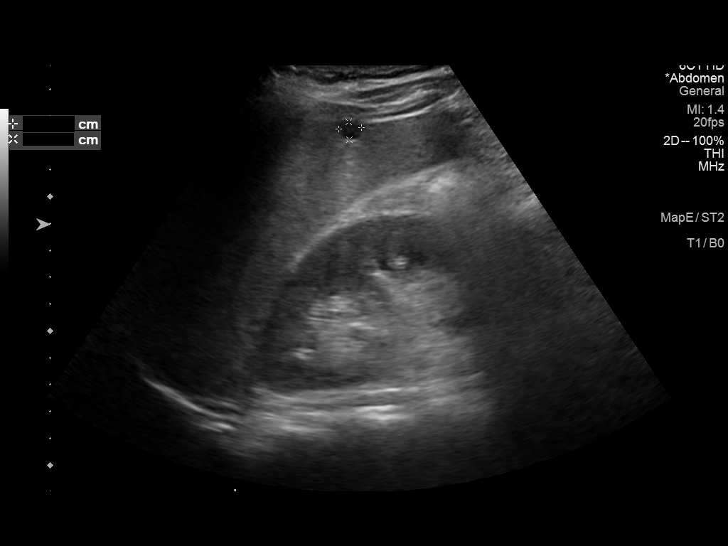
[im 40/42]
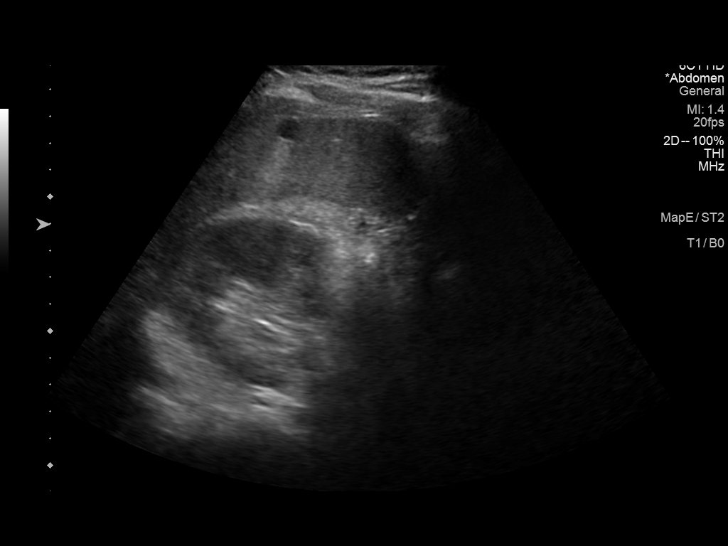

[Series 2001: us abdomen limited · 0.26mm/px · 1 of 1 slices shown (2 of 2)]
[im 1/1]
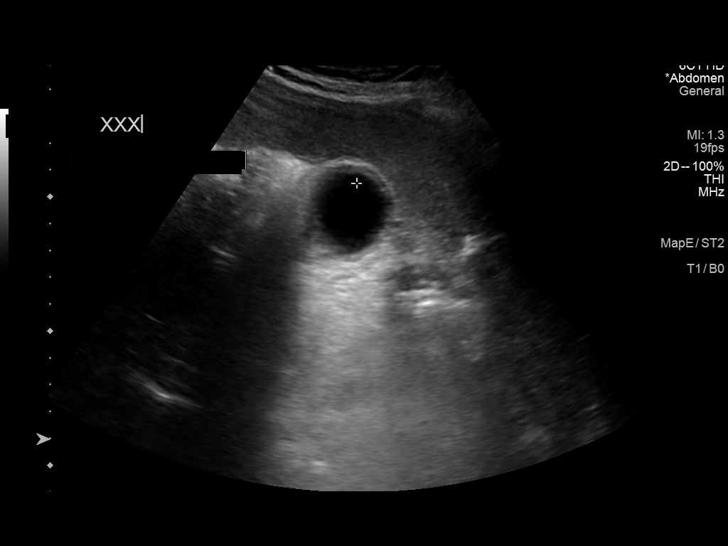

[13 of 25 positions shown; findings below may reference images not displayed]

FINDINGS: Gallbladder:

No gallstones or wall thickening visualized. No sonographic Murphy
sign noted by sonographer.

Common bile duct:

Diameter: 2.8 mm with no appreciable intrahepatic biliary
dilatation.

Liver:

9 mm simple cyst noted in the left lobe dome. Otherwise no focal
lesion identified. Diffuse increased hepatic echogenicity consistent
with steatosis and this was noted on the prior ultrasound as well.
There is a slight coarsening of the hepatic echotexture which was
also seen previously. Portal vein is patent on color Doppler imaging
with normal direction of blood flow towards the liver.

Other: None.
IMPRESSION: Diffuse hepatic attenuation consistent with fatty replacement with
slightly coarsened echotexture. Similar findings on last year's
ultrasound with hepatic steatosis also noted on MRI. Small cyst in
hepatic dome. No other focal lesion is seen through the steatosis.
Gallbladder and bile ducts unremarkable.

## 2022-08-17 ENCOUNTER — Inpatient Hospital Stay
Admission: EM | Admit: 2022-08-17 | Discharge: 2022-08-21 | DRG: 897 | Disposition: A | Discharge status: Home / Self Care | Payer: Self-pay | Attending: Internal Medicine | Admitting: Internal Medicine

## 2022-08-17 ENCOUNTER — Emergency Department: Payer: Self-pay

## 2022-08-17 ENCOUNTER — Other Ambulatory Visit: Payer: Self-pay

## 2022-08-17 DIAGNOSIS — D62 Acute posthemorrhagic anemia: Secondary | ICD-10-CM | POA: Diagnosis present

## 2022-08-17 DIAGNOSIS — K7460 Unspecified cirrhosis of liver: Secondary | ICD-10-CM | POA: Diagnosis present

## 2022-08-17 DIAGNOSIS — S43015A Anterior dislocation of left humerus, initial encounter: Secondary | ICD-10-CM

## 2022-08-17 DIAGNOSIS — Z833 Family history of diabetes mellitus: Secondary | ICD-10-CM

## 2022-08-17 DIAGNOSIS — S01512A Laceration without foreign body of oral cavity, initial encounter: Secondary | ICD-10-CM | POA: Diagnosis present

## 2022-08-17 DIAGNOSIS — X58XXXA Exposure to other specified factors, initial encounter: Secondary | ICD-10-CM | POA: Diagnosis present

## 2022-08-17 DIAGNOSIS — R7989 Other specified abnormal findings of blood chemistry: Secondary | ICD-10-CM

## 2022-08-17 DIAGNOSIS — E876 Hypokalemia: Secondary | ICD-10-CM | POA: Diagnosis present

## 2022-08-17 DIAGNOSIS — F10239 Alcohol dependence with withdrawal, unspecified: Principal | ICD-10-CM | POA: Diagnosis present

## 2022-08-17 DIAGNOSIS — F10939 Alcohol use, unspecified with withdrawal, unspecified: Principal | ICD-10-CM

## 2022-08-17 DIAGNOSIS — D6959 Other secondary thrombocytopenia: Secondary | ICD-10-CM | POA: Diagnosis present

## 2022-08-17 DIAGNOSIS — R569 Unspecified convulsions: Secondary | ICD-10-CM | POA: Diagnosis present

## 2022-08-17 DIAGNOSIS — K7031 Alcoholic cirrhosis of liver with ascites: Secondary | ICD-10-CM | POA: Diagnosis present

## 2022-08-17 DIAGNOSIS — Z79899 Other long term (current) drug therapy: Secondary | ICD-10-CM

## 2022-08-17 DIAGNOSIS — D689 Coagulation defect, unspecified: Secondary | ICD-10-CM | POA: Diagnosis present

## 2022-08-17 DIAGNOSIS — E871 Hypo-osmolality and hyponatremia: Secondary | ICD-10-CM | POA: Diagnosis present

## 2022-08-17 DIAGNOSIS — K219 Gastro-esophageal reflux disease without esophagitis: Secondary | ICD-10-CM | POA: Insufficient documentation

## 2022-08-17 DIAGNOSIS — F10930 Alcohol use, unspecified with withdrawal, uncomplicated: Secondary | ICD-10-CM

## 2022-08-17 DIAGNOSIS — Z87891 Personal history of nicotine dependence: Secondary | ICD-10-CM

## 2022-08-17 DIAGNOSIS — K7011 Alcoholic hepatitis with ascites: Secondary | ICD-10-CM | POA: Diagnosis present

## 2022-08-17 DIAGNOSIS — Z8249 Family history of ischemic heart disease and other diseases of the circulatory system: Secondary | ICD-10-CM

## 2022-08-17 LAB — CBC WITH DIFFERENTIAL/PLATELET
Abs Immature Granulocytes: 0.02 10*3/uL (ref 0.00–0.07)
Basophils Absolute: 0 10*3/uL (ref 0.0–0.1)
Basophils Relative: 0 %
Eosinophils Absolute: 0.1 10*3/uL (ref 0.0–0.5)
Eosinophils Relative: 1 %
HCT: 30.2 % — ABNORMAL LOW (ref 39.0–52.0)
Hemoglobin: 10.8 g/dL — ABNORMAL LOW (ref 13.0–17.0)
Immature Granulocytes: 0 %
Lymphocytes Relative: 38 %
Lymphs Abs: 2.7 10*3/uL (ref 0.7–4.0)
MCH: 33.8 pg (ref 26.0–34.0)
MCHC: 35.8 g/dL (ref 30.0–36.0)
MCV: 94.4 fL (ref 80.0–100.0)
Monocytes Absolute: 0.7 10*3/uL (ref 0.1–1.0)
Monocytes Relative: 10 %
Neutro Abs: 3.5 10*3/uL (ref 1.7–7.7)
Neutrophils Relative %: 51 %
Platelets: 27 10*3/uL — CL (ref 150–400)
RBC: 3.2 MIL/uL — ABNORMAL LOW (ref 4.22–5.81)
RDW: 18 % — ABNORMAL HIGH (ref 11.5–15.5)
Smear Review: NORMAL
WBC: 7 10*3/uL (ref 4.0–10.5)
nRBC: 0 % (ref 0.0–0.2)

## 2022-08-17 LAB — COMPREHENSIVE METABOLIC PANEL
ALT: 66 U/L — ABNORMAL HIGH (ref 0–44)
AST: 244 U/L — ABNORMAL HIGH (ref 15–41)
Albumin: 2.7 g/dL — ABNORMAL LOW (ref 3.5–5.0)
Alkaline Phosphatase: 149 U/L — ABNORMAL HIGH (ref 38–126)
Anion gap: 15 (ref 5–15)
BUN: 11 mg/dL (ref 6–20)
CO2: 19 mmol/L — ABNORMAL LOW (ref 22–32)
Calcium: 8.6 mg/dL — ABNORMAL LOW (ref 8.9–10.3)
Chloride: 98 mmol/L (ref 98–111)
Creatinine, Ser: 0.92 mg/dL (ref 0.61–1.24)
GFR, Estimated: >60 mL/min (ref >60)
Glucose, Bld: 104 mg/dL — ABNORMAL HIGH (ref 70–99)
Potassium: 3.4 mmol/L — ABNORMAL LOW (ref 3.5–5.1)
Sodium: 132 mmol/L — ABNORMAL LOW (ref 135–145)
Total Bilirubin: 6.8 mg/dL — ABNORMAL HIGH (ref 0.3–1.2)
Total Protein: 7.5 g/dL (ref 6.5–8.1)

## 2022-08-17 LAB — PROTIME-INR
INR: 2.2 — ABNORMAL HIGH (ref 0.8–1.2)
Prothrombin Time: 25 seconds — ABNORMAL HIGH (ref 11.4–15.2)

## 2022-08-17 MED ORDER — CHLORDIAZEPOXIDE HCL 25 MG PO CAPS
25.0000 mg | ORAL_CAPSULE | Freq: Three times a day (TID) | ORAL | Status: AC
  Administered 2022-08-18 – 2022-08-20 (×9): 25 mg via ORAL
  Filled 2022-08-17 (×9): qty 1

## 2022-08-17 MED ORDER — TRAZODONE HCL 50 MG PO TABS: 25.0000 mg | ORAL_TABLET | Freq: Every evening | ORAL | Status: DC | PRN

## 2022-08-17 MED ORDER — ACETAMINOPHEN 650 MG RE SUPP: 650.0000 mg | Freq: Four times a day (QID) | RECTAL | Status: DC | PRN

## 2022-08-17 MED ORDER — ONDANSETRON HCL 4 MG/2ML IJ SOLN
4.0000 mg | Freq: Once | INTRAMUSCULAR | Status: AC
  Administered 2022-08-17: 4 mg via INTRAVENOUS
  Filled 2022-08-17: qty 2

## 2022-08-17 MED ORDER — HYDROMORPHONE HCL 1 MG/ML IJ SOLN
0.5000 mg | INTRAMUSCULAR | Status: DC | PRN
  Administered 2022-08-17 – 2022-08-18 (×5): 0.5 mg via INTRAVENOUS
  Filled 2022-08-17 (×6): qty 0.5

## 2022-08-17 MED ORDER — LIDOCAINE HCL (PF) 1 % IJ SOLN
10.0000 mL | Freq: Once | INTRAMUSCULAR | Status: AC
  Administered 2022-08-17: 10 mL via INTRADERMAL
  Filled 2022-08-17: qty 10

## 2022-08-17 MED ORDER — THIAMINE MONONITRATE 100 MG PO TABS: 100.0000 mg | ORAL_TABLET | Freq: Every day | ORAL | Status: DC

## 2022-08-17 MED ORDER — ACETAMINOPHEN 325 MG PO TABS: 650.0000 mg | ORAL_TABLET | Freq: Four times a day (QID) | ORAL | Status: DC | PRN

## 2022-08-17 MED ORDER — MORPHINE SULFATE (PF) 4 MG/ML IV SOLN
4.0000 mg | INTRAVENOUS | Status: DC | PRN
  Administered 2022-08-17: 4 mg via INTRAVENOUS
  Filled 2022-08-17: qty 1

## 2022-08-17 MED ORDER — OMEPRAZOLE MAGNESIUM 20 MG PO TBEC: 40.0000 mg | DELAYED_RELEASE_TABLET | Freq: Every day | ORAL | Status: DC

## 2022-08-17 MED ORDER — THIAMINE MONONITRATE 100 MG PO TABS
100.0000 mg | ORAL_TABLET | Freq: Every day | ORAL | Status: DC
  Administered 2022-08-18 – 2022-08-21 (×3): 100 mg via ORAL
  Filled 2022-08-17 (×4): qty 1

## 2022-08-17 MED ORDER — MAGNESIUM HYDROXIDE 400 MG/5ML PO SUSP: 30.0000 mL | Freq: Every day | ORAL | Status: DC | PRN

## 2022-08-17 MED ORDER — SODIUM CHLORIDE 0.9 % IV SOLN: INTRAVENOUS | Status: DC

## 2022-08-17 MED ORDER — ONDANSETRON HCL 4 MG PO TABS: 8.0000 mg | ORAL_TABLET | Freq: Every day | ORAL | Status: DC | PRN

## 2022-08-17 MED ORDER — ADULT MULTIVITAMIN W/MINERALS CH: 1.0000 | ORAL_TABLET | Freq: Every day | ORAL | Status: DC

## 2022-08-17 MED ORDER — ONDANSETRON HCL 4 MG/2ML IJ SOLN
4.0000 mg | Freq: Four times a day (QID) | INTRAMUSCULAR | Status: DC | PRN
  Administered 2022-08-18: 4 mg via INTRAVENOUS
  Filled 2022-08-17: qty 2

## 2022-08-17 MED ORDER — LACTULOSE 10 GM/15ML PO SOLN
30.0000 g | Freq: Two times a day (BID) | ORAL | Status: DC
  Administered 2022-08-18 – 2022-08-20 (×5): 30 g via ORAL
  Filled 2022-08-17 (×8): qty 60

## 2022-08-17 MED ORDER — ADULT MULTIVITAMIN W/MINERALS CH
1.0000 | ORAL_TABLET | Freq: Every day | ORAL | Status: DC
  Administered 2022-08-18 – 2022-08-21 (×4): 1 via ORAL
  Filled 2022-08-17 (×4): qty 1

## 2022-08-17 MED ORDER — FOLIC ACID 1 MG PO TABS: 1.0000 mg | ORAL_TABLET | Freq: Every day | ORAL | Status: DC

## 2022-08-17 MED ORDER — TRAZODONE HCL 50 MG PO TABS
25.0000 mg | ORAL_TABLET | Freq: Every evening | ORAL | Status: DC | PRN
  Administered 2022-08-18: 50 mg via ORAL
  Filled 2022-08-17: qty 1

## 2022-08-17 MED ORDER — THIAMINE HCL 100 MG/ML IJ SOLN
100.0000 mg | Freq: Every day | INTRAMUSCULAR | Status: DC
  Administered 2022-08-17 – 2022-08-20 (×2): 100 mg via INTRAVENOUS
  Filled 2022-08-17 (×3): qty 2

## 2022-08-17 MED ORDER — PANTOPRAZOLE SODIUM 40 MG PO TBEC: 40.0000 mg | DELAYED_RELEASE_TABLET | Freq: Every day | ORAL | Status: DC

## 2022-08-17 MED ORDER — PANTOPRAZOLE SODIUM 40 MG IV SOLR
40.0000 mg | Freq: Two times a day (BID) | INTRAVENOUS | Status: DC
  Administered 2022-08-17 – 2022-08-19 (×5): 40 mg via INTRAVENOUS
  Filled 2022-08-17 (×5): qty 10

## 2022-08-17 MED ORDER — LORAZEPAM 2 MG PO TABS: 0.0000 mg | ORAL_TABLET | Freq: Two times a day (BID) | ORAL | Status: DC

## 2022-08-17 MED ORDER — LORAZEPAM 2 MG PO TABS: 0.0000 mg | ORAL_TABLET | Freq: Four times a day (QID) | ORAL | Status: AC

## 2022-08-17 MED ORDER — LORAZEPAM 1 MG PO TABS
1.0000 mg | ORAL_TABLET | Freq: Four times a day (QID) | ORAL | Status: AC
  Administered 2022-08-17 – 2022-08-19 (×2): 1 mg via ORAL
  Filled 2022-08-17 (×2): qty 1

## 2022-08-17 MED ORDER — CHLORDIAZEPOXIDE HCL 25 MG PO CAPS
25.0000 mg | ORAL_CAPSULE | Freq: Once | ORAL | Status: AC
  Administered 2022-08-17: 25 mg via ORAL
  Filled 2022-08-17: qty 1

## 2022-08-17 MED ORDER — ONDANSETRON HCL 4 MG PO TABS: 4.0000 mg | ORAL_TABLET | Freq: Four times a day (QID) | ORAL | Status: DC | PRN

## 2022-08-17 MED ORDER — LORAZEPAM 1 MG PO TABS: 1.0000 mg | ORAL_TABLET | Freq: Two times a day (BID) | ORAL | Status: DC

## 2022-08-17 MED ORDER — FOLIC ACID 1 MG PO TABS
1.0000 mg | ORAL_TABLET | Freq: Every day | ORAL | Status: DC
  Administered 2022-08-17 – 2022-08-21 (×5): 1 mg via ORAL
  Filled 2022-08-17 (×5): qty 1

## 2022-08-17 NOTE — Assessment & Plan Note (Signed)
-   LFTs have worsened from October 2023. - This is like secondary to alcoholic hepatitis and liver cirrhosis.

## 2022-08-17 NOTE — ED Triage Notes (Signed)
Pt to ED [facility] from home  Pt was daily drinker for 7 years and stopped 3 days ago   Pt had ETOH withdrawel seizure lasting "7 minutes" witnessed, arms and legs involved  L shoulder is dislocated and pt bit tongue badly  Pt is now alert and oriented  EMS VSS: 94/40, CBG 84, HR 80s  18# to L forearm, fluid so far

## 2022-08-17 NOTE — Assessment & Plan Note (Addendum)
-   This is associated with liver cell failure, manifested by severe  thrombocytopenia and coagulopathy with INR of 2.2 and PT 25. - The patient be placed on IV PPI therapy. - Will follow-up platelets levels. - We will continue lactulose and Aldactone. - We will obtain a GI consult on the patient is here. - I notified Dr. Mia Creek about the patient.

## 2022-08-17 NOTE — ED Provider Notes (Signed)
[facility] Provider Note    Event Date/Time   First MD Initiated Contact with Patient 08/17/22 1728     (approximate)   History   Seizures   HPI  Andrew Cummings is a 42 y.o. male with a history of significant alcohol dependence and abuse recently stopped drinking alcohol a few days ago and had a seizure today lasting about 7 minutes.  Patient bit his tongue fell hitting his left shoulder has had dislocation of his left shoulder before.  Does have reported history of cirrhosis.     Physical Exam   Triage Vital Signs: ED Triage Vitals  Encounter Vitals Group     BP      Systolic BP Percentile      Diastolic BP Percentile      Pulse      Resp      Temp      Temp src      SpO2      Weight      Height      Head Circumference      Peak Flow      Pain Score      Pain Loc      Pain Education      Exclude from Growth Chart     Most recent vital signs: Vitals:   08/17/22 1841 08/17/22 1900  BP: 109/71 129/82  Pulse: 86 82  Resp:  20  Temp:    SpO2:  100%     Constitutional: Alert  Eyes: Conjunctivae are normal.  Head: Atraumatic. Nose: No congestion/rhinnorhea. Mouth/Throat: Mucous membranes are moist.  Does have hemostatic injury to the left lateral tongue.  Her Neck: Painless ROM.  Cardiovascular:   Good peripheral circulation. Respiratory: Normal respiratory effort.  No retractions.  Gastrointestinal: Soft and nontender.  Musculoskeletal: Deformity left shoulder consistent with anterior dislocation.  Vastly intact distally. Neurologic:  No gross focal neurologic deficits are appreciated.  Mildly tremulous. Skin:  Skin is warm, dry and intact. No rash noted. Psychiatric: Anxious but cooperative    ED Results / Procedures / Treatments   Labs (all labs ordered are listed, but only abnormal results are displayed) Labs Reviewed  CBC WITH DIFFERENTIAL/PLATELET - Abnormal; Notable for the following components:      Result  Value   RBC 3.20 (*)    Hemoglobin 10.8 (*)    HCT 30.2 (*)    RDW 18.0 (*)    Platelets 27 (*)    All other components within normal limits  COMPREHENSIVE METABOLIC PANEL - Abnormal; Notable for the following components:   Sodium 132 (*)    Potassium 3.4 (*)    CO2 19 (*)    Glucose, Bld 104 (*)    Calcium 8.6 (*)    Albumin 2.7 (*)    AST 244 (*)    ALT 66 (*)    Alkaline Phosphatase 149 (*)    Total Bilirubin 6.8 (*)    All other components within normal limits  PROTIME-INR - Abnormal; Notable for the following components:   Prothrombin Time 25.0 (*)    INR 2.2 (*)    All other components within normal limits  BASIC METABOLIC PANEL  CBC  CBG MONITORING, ED     EKG  ED ECG REPORT I, Willy Eddy, the attending physician, personally viewed and interpreted this ECG.   Date: 08/17/2022  EKG Time: 17:35  Rate: 85  Rhythm: sinus  Axis: normal  Intervals: normal  ST&T Change:  no stemi    RADIOLOGY Please see ED Course for my review and interpretation.  I personally reviewed all radiographic images ordered to evaluate for the above acute complaints and reviewed radiology reports and findings.  These findings were personally discussed with the patient.  Please see medical record for radiology report.    PROCEDURES:  Critical Care performed: No  .Ortho Injury Treatment  Date/Time: 08/17/2022 9:09 PM  Performed by: Willy Eddy, MD Authorized by: Willy Eddy, MD   Consent:    Consent obtained:  Harvie Heck location: shoulder Location details: left shoulder Injury type: dislocation Dislocation type: anterior Hill-Sachs deformity: no Chronicity: new Pre-procedure neurovascular assessment: neurovascularly intact Anesthesia: local infiltration  Anesthesia: Local anesthesia used: yes Local Anesthetic: lidocaine 1% without epinephrine Anesthetic total: 10 mL Manipulation performed: yes Reduction method: scapular manipulation and external  rotation Reduction successful: yes X-ray confirmed reduction: yes Immobilization: sling      MEDICATIONS ORDERED IN ED: Medications  morphine (PF) 4 MG/ML injection 4 mg (4 mg Intravenous Given 08/17/22 1815)  LORazepam (ATIVAN) tablet 1 mg (1 mg Oral Given 08/17/22 1849)    Or  LORazepam (ATIVAN) tablet 0-4 mg ( Oral See Alternative 08/17/22 1849)  LORazepam (ATIVAN) tablet 1 mg (has no administration in time range)    Or  LORazepam (ATIVAN) tablet 0-4 mg (has no administration in time range)  thiamine (VITAMIN B1) tablet 100 mg ( Oral See Alternative 08/17/22 1847)    Or  thiamine (VITAMIN B1) injection 100 mg (100 mg Intravenous Given 08/17/22 1847)  HYDROmorphone (DILAUDID) injection 0.5 mg (0.5 mg Intravenous Given 08/17/22 1917)  traZODone (DESYREL) tablet 25-50 mg (has no administration in time range)  lactulose (CHRONULAC) 10 GM/15ML solution 30 g (has no administration in time range)  pantoprazole (PROTONIX) EC tablet 40 mg (has no administration in time range)  0.9 %  sodium chloride infusion (has no administration in time range)  acetaminophen (TYLENOL) tablet 650 mg (has no administration in time range)    Or  acetaminophen (TYLENOL) suppository 650 mg (has no administration in time range)  magnesium hydroxide (MILK OF MAGNESIA) suspension 30 mL (has no administration in time range)  ondansetron (ZOFRAN) tablet 4 mg (has no administration in time range)    Or  ondansetron (ZOFRAN) injection 4 mg (has no administration in time range)  folic acid (FOLVITE) tablet 1 mg (has no administration in time range)  multivitamin with minerals tablet 1 tablet (has no administration in time range)  ondansetron (ZOFRAN) injection 4 mg (4 mg Intravenous Given 08/17/22 1815)  lidocaine (PF) (XYLOCAINE) 1 % injection 10 mL (10 mLs Intradermal Given by Other 08/17/22 1854)  chlordiazePOXIDE (LIBRIUM) capsule 25 mg (25 mg Oral Given 08/17/22 2013)     IMPRESSION / MDM / ASSESSMENT AND PLAN /  ED COURSE  I reviewed the triage vital signs and the nursing notes.                              Differential diagnosis includes, but is not limited to, DTs, alcohol withdrawal, seizure, SDH, IPH, dislocation, contusion, fracture, electrolyte abnormality  Patient presenting to the ER for evaluation of symptoms as described above.  Based on symptoms, risk factors and considered above differential, this presenting complaint could reflect a potentially life-threatening illness therefore the patient will be placed on continuous pulse oximetry and telemetry for monitoring.  Laboratory evaluation will be sent to evaluate for the above complaints.  Patient arrives  protecting his airway have high suspicion the patient is in alcohol withdrawal seizures.    Clinical Course as of 08/17/22 2125  Tue Aug 17, 2022  1812 CT head on my review and interpretation without evidence of IPH.  Will await formal radiology report. [PR]  1817 X-ray of left shoulder does show evidence of anterior dislocation. [PR]  1822 With thrombocytopenia.  His findings are consistent with alcoholic cirrhosis. [PR]  1932 Patient tolerated reduction without complication. [PR]    Clinical Course User Index [PR] Willy Eddy, MD   Discussed case in consultation with hospitalist for admission.  FINAL CLINICAL IMPRESSION(S) / ED DIAGNOSES   Final diagnoses:  Alcohol withdrawal seizure with complication (HCC)  Anterior dislocation of left shoulder, initial encounter     Rx / DC Orders   ED Discharge Orders     None        Note:  This document was prepared using Dragon voice recognition software and may include unintentional dictation errors.    Willy Eddy, MD 08/17/22 2125

## 2022-08-17 NOTE — ED Notes (Signed)
X-ray at bedside

## 2022-08-17 NOTE — Assessment & Plan Note (Signed)
-   The patient will be placed on IV PPI therapy as mentioned above.

## 2022-08-17 NOTE — ED Notes (Signed)
EDP at bedside. Seizure pads on bed.

## 2022-08-17 NOTE — Assessment & Plan Note (Signed)
-   This was reduced in the ER.

## 2022-08-17 NOTE — H&P (Signed)
[facility]   PATIENT NAME: Andrew Cummings    MR#:  [id number]  DATE OF BIRTH:  [birth year]  DATE OF ADMISSION:  08/17/2022  PRIMARY CARE PHYSICIAN: Farris Has, MD   Patient is coming from: Home  REQUESTING/REFERRING PHYSICIAN: Willy Eddy, MD  CHIEF COMPLAINT:   Chief Complaint  Patient presents with   Seizures    HISTORY OF PRESENT ILLNESS:  Andrew Cummings is a 42 y.o. African-American male with medical history significant for alcohol dependence, alcoholic liver cirrhosis and liver cell failure, and history of bilateral shoulder dislocation, who presented to the emergency room with acute onset of seizures.  The patient has a tongue bite secondary to seizure as well as left shoulder dislocation that was reduced in the ER.  He denies any fever or chills.  No nausea or vomiting or abdominal pain.  No dysuria, oliguria or hematuria or flank pain.  No chest pain or palpitations.  No cough or wheezing or hemoptysis.  No paresthesias or focal muscle weakness.  ED Course: When he came to the ER vital signs were within normal.  Labs revealed mild hyponatremia and hypokalemia, calcium of 8.6, alk phos 149, albumin 2.7 with total protein of 6.8.  AST was 244 and ALT 66 above previous levels last year.  CBC showed anemia with hemoglobin 10.8 and hematocrit 30.2 significantly better than previous levels in October 2023.  Platelets unfortunately were only 27 compared to 109 and then.  INR was 2.2 and PT 25. EKG as reviewed by me : EKG showed normal sinus rhythm with a rate of 84 with abnormal R wave progression and Q waves inferiorly with T wave inversion. Imaging: Left shoulder x-ray initially showed left anterior shoulder dislocation of the glenohumeral joint and later on repeat x-ray showed successful interval reduction.  Portable chest x-ray showed no acute cardiopulmonary disease with underinflation.  The patient was given IV Ativan, 25 mg p.o. Librium and 4 mg of IV  morphine sulfate and 4 mg of IV Zofran.  He will be admitted to a medical telemetry bed for further evaluation and management. PAST MEDICAL HISTORY:   Past Medical History:  Diagnosis Date   Alcohol abuse    History of closed shoulder dislocation    bilateral  -Liver cirrhosis  PAST SURGICAL HISTORY:   Past Surgical History:  Procedure Laterality Date   ESOPHAGOGASTRODUODENOSCOPY (EGD) WITH PROPOFOL N/A 10/30/2021   Procedure: ESOPHAGOGASTRODUODENOSCOPY (EGD) WITH PROPOFOL;  Surgeon: Wyline Mood, MD;  Location: [facility] ENDOSCOPY;  Service: Gastroenterology;  Laterality: N/A;    SOCIAL HISTORY:   Social History   Tobacco Use   Smoking status: Former    Types: Cigarettes   Smokeless tobacco: Never  Substance Use Topics   Alcohol use: Yes    Comment: 1 bottle wine daily    FAMILY HISTORY:   Positive for hypertension and diabetes mellitus.  DRUG ALLERGIES:  No Known Allergies  REVIEW OF SYSTEMS:   ROS As per history of present illness. All pertinent systems were reviewed above. Constitutional, HEENT, cardiovascular, respiratory, GI, GU, musculoskeletal, neuro, psychiatric, endocrine, integumentary and hematologic systems were reviewed and are otherwise negative/unremarkable except for positive findings mentioned above in the HPI.   MEDICATIONS AT HOME:   Prior to Admission medications   Medication Sig Start Date End Date Taking? Authorizing Provider  atovaquone-proguanil (MALARONE) 250-100 MG TABS tablet Take 1 tablet by mouth daily. 11/02/21   [provider]  folic acid (FOLVITE) 1 MG tablet Take 1 tablet (1  mg total) by mouth daily. 10/05/21   Wouk, Wilfred Curtis, MD  furosemide (LASIX) 40 MG tablet Take 1 tablet (40 mg total) by mouth daily. 10/05/21   Wouk, Wilfred Curtis, MD  lactulose (CHRONULAC) 10 GM/15ML solution Take 45 mLs (30 g total) by mouth 2 (two) times daily. 10/21/21   Wyline Mood, MD  Multiple Vitamin (MULTIVITAMIN WITH MINERALS) TABS tablet Take 1  tablet by mouth daily. 10/05/21   Wouk, Wilfred Curtis, MD  omeprazole (PRILOSEC OTC) 20 MG tablet Take 2 tablets (40 mg total) by mouth daily. 10/30/21 01/07/22  Wyline Mood, MD  omeprazole (PRILOSEC) 20 MG capsule Take by mouth daily. 10/30/21   [provider]  ondansetron (ZOFRAN) 8 MG tablet Take 8 mg by mouth daily as needed. 11/01/21   [provider]  spironolactone (ALDACTONE) 100 MG tablet Take 1 tablet (100 mg total) by mouth daily. 10/05/21   Wouk, Wilfred Curtis, MD  traZODone (DESYREL) 50 MG tablet Take 25-50 mg by mouth at bedtime as needed. 10/23/21   [provider]      VITAL SIGNS:  Blood pressure 129/82, pulse 82, temperature 98.8 F (37.1 C), temperature source Oral, resp. rate 20, height 5\' 11"  (1.803 m), weight 81.6 kg, SpO2 100%.  PHYSICAL EXAMINATION:  Physical Exam  GENERAL:  42 y.o.-year-old African-American male patient lying in the bed with no acute distress.  EYES: Pupils equal, round, reactive to light and accommodation.  Positive scleral icterus. Extraocular muscles intact.  HEENT: Head atraumatic, normocephalic. Oropharynx with left-sided tongue bite with adequate hemostasis and nasopharynx clear.   NECK:  Supple, no jugular venous distention. No thyroid enlargement, no tenderness.  LUNGS: Normal breath sounds bilaterally, no wheezing, rales,rhonchi or crepitation. No use of accessory muscles of respiration.  CARDIOVASCULAR: Regular rate and rhythm, S1, S2 normal. No murmurs, rubs, or gallops.  ABDOMEN: Soft, nondistended, nontender. Bowel sounds present. No organomegaly or mass.  EXTREMITIES: No pedal edema, cyanosis, or clubbing.  NEUROLOGIC: Cranial nerves II through XII are intact. Muscle strength 5/5 in all extremities. Sensation intact. Gait not checked.  PSYCHIATRIC: The patient is alert and oriented x 3.  Normal affect and good eye contact. SKIN: No obvious rash, lesion, or ulcer.   LABORATORY PANEL:   CBC Recent Labs  Lab  08/17/22 1730  WBC 7.0  HGB 10.8*  HCT 30.2*  PLT 27*   ------------------------------------------------------------------------------------------------------------------  Chemistries  Recent Labs  Lab 08/17/22 1730  NA 132*  K 3.4*  CL 98  CO2 19*  GLUCOSE 104*  BUN 11  CREATININE 0.92  CALCIUM 8.6*  AST 244*  ALT 66*  ALKPHOS 149*  BILITOT 6.8*   ------------------------------------------------------------------------------------------------------------------  Cardiac Enzymes No results for input(s): "TROPONINI" in the last 168 hours. ------------------------------------------------------------------------------------------------------------------  RADIOLOGY:  DG Shoulder Left  Result Date: 08/17/2022 CLINICAL DATA:  Post reduction. EXAM: LEFT SHOULDER - 2+ VIEW COMPARISON:  Earlier radiograph dated 08/17/2022. FINDINGS: Interval reduction of the previously seen dislocated left shoulder. The humeral head appears in anatomic alignment with the glenoid on the provided images. No acute fracture or dislocation. The bones are well mineralized. No arthritic changes. The soft tissues are unremarkable. IMPRESSION: Successful interval reduction of the left shoulder. Electronically Signed   By: Elgie Collard M.D.   On: 08/17/2022 20:12   DG Shoulder Left  Result Date: 08/17/2022 CLINICAL DATA:  Left shoulder dislocation. EXAM: LEFT SHOULDER - 2+ VIEW COMPARISON:  December 19, 2020. FINDINGS: Anterior dislocation of left glenohumeral joint is noted. No definite fracture  is noted. IMPRESSION: Anterior dislocation of left glenohumeral joint. Electronically Signed   By: Lupita Raider M.D.   On: 08/17/2022 18:26   DG Chest Portable 1 View  Result Date: 08/17/2022 CLINICAL DATA:  Seizure. EXAM: PORTABLE CHEST 1 VIEW COMPARISON:  01/27/2021. FINDINGS: No consolidation, pneumothorax or effusion. No edema. Normal cardiopericardial silhouette. Overlapping cardiac leads. Stomach is  distended with air beneath the left hemidiaphragm. Underinflation. IMPRESSION: No acute cardiopulmonary disease.  Underinflation. Electronically Signed   By: Karen Kays M.D.   On: 08/17/2022 18:23   CT HEAD WO CONTRAST ( )  Result Date: 08/17/2022 CLINICAL DATA:  Seizure. EXAM: CT HEAD WITHOUT CONTRAST TECHNIQUE: Contiguous axial images were obtained from the base of the skull through the vertex without intravenous contrast. RADIATION DOSE REDUCTION: This exam was performed according to the departmental dose-optimization program which includes automated exposure control, adjustment of the mA and/or kV according to patient size and/or use of iterative reconstruction technique. COMPARISON:  June 29, 2020 FINDINGS: Brain: No evidence of acute infarction, hemorrhage, hydrocephalus, extra-axial collection or mass lesion/mass effect. Vascular: No hyperdense vessel or unexpected calcification. Skull: Normal. Negative for fracture or focal lesion. Sinuses/Orbits: No acute finding. Other: None. IMPRESSION: Normal head CT. Electronically Signed   By: Aram Candela M.D.   On: 08/17/2022 18:21      IMPRESSION AND PLAN:  Assessment and Plan: * Alcohol withdrawal seizure (HCC) - The patient will be admitted to a medical telemetry bed. - The patient will be placed on seizure precautions. - He was placed on CIWA protocol. - As needed IV Ativan will be utilized for seizure as well. - EEG will be obtained. - Neurology consult will be obtained. - I notified Dr. Selina Cooley about patient.  Alcoholic cirrhosis of liver with ascites (HCC) - This is associated with liver cell failure, manifested by severe  thrombocytopenia and coagulopathy with INR of 2.2 and PT 25. - The patient be placed on IV PPI therapy. - Will follow-up platelets levels. - We will continue lactulose and Aldactone.  Anterior dislocation of left shoulder - This was reduced in the ER.  Elevated LFTs - LFTs have worsened from October  2023. - This is like secondary to alcoholic hepatitis and liver cirrhosis.  GERD without esophagitis - The patient will be placed on IV PPI therapy as mentioned above.    DVT prophylaxis: SCDs.  Medical prophylaxis currently contraindicated due to his coagulopathy. Advanced Care Planning:  Code Status: full code. Family Communication:  The plan of care was discussed in details with the patient (and family). I answered all questions. The patient agreed to proceed with the above mentioned plan. Further management will depend upon hospital course. Disposition Plan: Back to previous home environment Consults called: Neurology. All the records are reviewed and case discussed with ED provider.  Status is: Inpatient  At the time of the admission, it appears that the appropriate admission status for this patient is inpatient.  This is judged to be reasonable and necessary in order to provide the required intensity of service to ensure the patient's safety given the presenting symptoms, physical exam findings and initial radiographic and laboratory data in the context of comorbid conditions.  The patient requires inpatient status due to high intensity of service, high risk of further deterioration and high frequency of surveillance required.  I certify that at the time of admission, it is my clinical judgment that the patient will require inpatient hospital care extending more than 2 midnights.  Dispo: The patient is from: Home              Anticipated d/c is to: Home              Patient currently is not medically stable to d/c.              Difficult to place patient: No  Hannah Beat M.D on 08/17/2022 at 9:28 PM  [facility]   From 7 PM-7 AM, contact night-coverage [email or web address]  CC: Primary care physician; Farris Has, MD

## 2022-08-17 NOTE — Assessment & Plan Note (Signed)
-   The patient will be admitted to a medical telemetry bed. - The patient will be placed on seizure precautions. - He was placed on CIWA protocol. - As needed IV Ativan will be utilized for seizure as well. - EEG will be obtained. - Neurology consult will be obtained. - I notified Dr. Selina Cooley about patient.

## 2022-08-18 ENCOUNTER — Ambulatory Visit: Payer: Self-pay

## 2022-08-18 ENCOUNTER — Encounter: Payer: Self-pay | Admitting: Family Medicine

## 2022-08-18 DIAGNOSIS — F10939 Alcohol use, unspecified with withdrawal, unspecified: Secondary | ICD-10-CM

## 2022-08-18 LAB — BASIC METABOLIC PANEL
Anion gap: 6 (ref 5–15)
BUN: 13 mg/dL (ref 6–20)
CO2: 23 mmol/L (ref 22–32)
Calcium: 7.8 mg/dL — ABNORMAL LOW (ref 8.9–10.3)
Chloride: 101 mmol/L (ref 98–111)
Creatinine, Ser: 0.5 mg/dL — ABNORMAL LOW (ref 0.61–1.24)
GFR, Estimated: >60 mL/min (ref >60)
Glucose, Bld: 113 mg/dL — ABNORMAL HIGH (ref 70–99)
Potassium: 5 mmol/L (ref 3.5–5.1)
Sodium: 130 mmol/L — ABNORMAL LOW (ref 135–145)

## 2022-08-18 MED ORDER — LORAZEPAM 2 MG/ML IJ SOLN: 1.0000 mg | Freq: Four times a day (QID) | INTRAMUSCULAR | Status: DC | PRN

## 2022-08-18 NOTE — Progress Notes (Signed)
Progress Note   Patient: Andrew Cummings HCW:[id number] DOB: 1980-09-05 DOA: 08/17/2022     1 DOS: the patient was seen and examined on 08/18/2022    Subjective:  Patient seen and examined at bedside this morning Denies nausea abdominal pain chest pain or cough He had an episode of vomiting yesterday I counseled him extensively concerning stopping alcohol use   Brief hospital course: Varnell Suit is a 42 y.o. African-American male with medical history significant for alcohol dependence, alcoholic liver cirrhosis and liver cell failure, and history of bilateral shoulder dislocation, who presented to the emergency room with acute onset of seizures.  The patient has a tongue bite secondary to seizure as well as left shoulder dislocation that was reduced in the ER.  He denies any fever or chills.  No nausea or vomiting or abdominal pain.  No dysuria, oliguria or hematuria or flank pain.  No chest pain or palpitations.  No cough or wheezing or hemoptysis.  No paresthesias or focal muscle weakness.   Assessment and Plan:   Alcohol withdrawal seizure [facility]) - The patient will be admitted to a medical telemetry bed. - The patient will be placed on seizure precautions. - He was placed on CIWA protocol. - As needed IV Ativan will be utilized for seizure as well. I personally reviewed patient's CT scan of the brain that did not show any acute intracranial pathology - EEG done pending results - Neurology on board and does not recommend any antiepileptic drugs - I personally discussed the case with neurologist Dr. Selina Cooley   Alcoholic cirrhosis of liver with ascites (HCC) - This is associated with liver cell failure, manifested by severe  thrombocytopenia and coagulopathy with INR of 2.2 and PT 25. - The patient be placed on IV PPI therapy. - Will follow-up platelets levels. - Continue lactulose and Aldactone. - I discussed with gastroenterologist and at this point there is no acute GI needs  and so gastroenterologist will be on standby    Anterior dislocation of left shoulder - This was reduced in the ER.   Elevated LFTs - LFTs have worsened from October 2023. - This is like secondary to alcoholic hepatitis and liver cirrhosis.   GERD without esophagitis - The patient will be placed on IV PPI therapy as mentioned above.       DVT prophylaxis: Continue SCD, holding pharmacologic agent on account of coagulopathy  Advanced Care Planning:  Code Status: full code.  Family Communication: None present at bedside at this time   Physical Exam:  GENERAL: Middle-age male laying in bed in no distress EYES: Pupils equal HEENT: Head atraumatic,  Tongue bite noted to the left side of the tongue NECK:  Supple LUNGS: Normal breath sounds bilaterally CARDIOVASCULAR: Regular rate and rhythm, S1, S2 normal.  ABDOMEN: Soft, nondistended, nontender.  EXTREMITIES: No pedal edema, cyanosis, or clubbing.  NEUROLOGIC: Cranial nerves II through XII are intact.  PSYCHIATRIC: Awake and alert x 3 SKIN: No obvious rash, lesion, or ulcer.   Vitals:   08/18/22 0900 08/18/22 1200 08/18/22 1242 08/18/22 1324  BP: 129/87 (!) 127/93 (!) 136/93 128/84  Pulse: 63 68 69 60  Resp: 14 18 15 16   Temp:    97.9 F (36.6 C)  TempSrc:    Oral  SpO2: 99% 97% 100% 100%  Weight:    87.1 kg  Height:        Data Reviewed: I have reviewed patient's CT scan of the brain, lab results, CBC BMP gastroenterology documentation radiology  documentation, nursing documentation as well as previous chart  Author: [name], MD 08/18/2022 3:33 PM  For on call review [email or web address].

## 2022-08-18 NOTE — Progress Notes (Signed)
Eeg done 

## 2022-08-18 NOTE — Progress Notes (Signed)
Consult cancelled-D/w Dr Meriam Sprague.  please call GI if consult needs to be seen .

## 2022-08-19 LAB — CBC WITH DIFFERENTIAL/PLATELET
Abs Immature Granulocytes: 0.04 10*3/uL (ref 0.00–0.07)
Basophils Absolute: 0 10*3/uL (ref 0.0–0.1)
Basophils Relative: 0 %
Eosinophils Absolute: 0.1 10*3/uL (ref 0.0–0.5)
Eosinophils Relative: 2 %
HCT: 27.9 % — ABNORMAL LOW (ref 39.0–52.0)
Hemoglobin: 10.1 g/dL — ABNORMAL LOW (ref 13.0–17.0)
Immature Granulocytes: 1 %
Lymphocytes Relative: 28 %
Lymphs Abs: 1.9 10*3/uL (ref 0.7–4.0)
MCH: 33.8 pg (ref 26.0–34.0)
MCHC: 36.2 g/dL — ABNORMAL HIGH (ref 30.0–36.0)
MCV: 93.3 fL (ref 80.0–100.0)
Monocytes Absolute: 0.6 10*3/uL (ref 0.1–1.0)
Monocytes Relative: 10 %
Neutro Abs: 4 10*3/uL (ref 1.7–7.7)
Neutrophils Relative %: 59 %
Platelets: 33 10*3/uL — ABNORMAL LOW (ref 150–400)
RBC: 2.99 MIL/uL — ABNORMAL LOW (ref 4.22–5.81)
RDW: 18.2 % — ABNORMAL HIGH (ref 11.5–15.5)
Smear Review: NORMAL
WBC: 6.7 10*3/uL (ref 4.0–10.5)
nRBC: 0 % (ref 0.0–0.2)

## 2022-08-19 LAB — BPAM PLATELET PHERESIS
Blood Product Expiration Date: 202408032359
ISSUE DATE / TIME: 202408011455
Unit Type and Rh: 6200

## 2022-08-19 LAB — PREPARE PLATELET PHERESIS: Unit division: 0

## 2022-08-19 LAB — APTT: aPTT: 42 seconds — ABNORMAL HIGH (ref 24–36)

## 2022-08-19 LAB — MAGNESIUM: Magnesium: 1.6 mg/dL — ABNORMAL LOW (ref 1.7–2.4)

## 2022-08-19 LAB — PROTIME-INR
INR: 2.2 — ABNORMAL HIGH (ref 0.8–1.2)
Prothrombin Time: 24.7 seconds — ABNORMAL HIGH (ref 11.4–15.2)

## 2022-08-19 LAB — TYPE AND SCREEN
ABO/RH(D): A POS
Antibody Screen: NEGATIVE

## 2022-08-19 MED ORDER — SODIUM CHLORIDE 0.9% IV SOLUTION: Freq: Once | INTRAVENOUS | Status: AC

## 2022-08-19 MED ORDER — VITAMIN K1 10 MG/ML IJ SOLN
5.0000 mg | Freq: Once | INTRAMUSCULAR | Status: AC
  Administered 2022-08-19: 5 mg via SUBCUTANEOUS
  Filled 2022-08-19: qty 0.5

## 2022-08-19 MED ORDER — MAGNESIUM SULFATE 4 GM/100ML IV SOLN
4.0000 g | Freq: Once | INTRAVENOUS | Status: AC
  Administered 2022-08-19: 4 g via INTRAVENOUS
  Filled 2022-08-19: qty 100

## 2022-08-19 NOTE — Plan of Care (Signed)
  Problem: Education: Goal: Knowledge of General Education information will improve Description: Including pain rating scale, medication(s)/side effects and non-pharmacologic comfort measures 08/19/2022 0241 by Smiley Houseman, LPN Outcome: Progressing 08/19/2022 0240 by Smiley Houseman, LPN Outcome: Progressing   Problem: Health Behavior/Discharge Planning: Goal: Ability to manage health-related needs will improve 08/19/2022 0241 by Smiley Houseman, LPN Outcome: Progressing 08/19/2022 0240 by Smiley Houseman, LPN Outcome: Progressing   Problem: Clinical Measurements: Goal: Ability to maintain clinical measurements within normal limits will improve 08/19/2022 0241 by Smiley Houseman, LPN Outcome: Progressing 08/19/2022 0240 by Smiley Houseman, LPN Outcome: Progressing Goal: Will remain free from infection 08/19/2022 0241 by Smiley Houseman, LPN Outcome: Progressing 08/19/2022 0240 by Smiley Houseman, LPN Outcome: Progressing Goal: Diagnostic test results will improve 08/19/2022 0241 by Smiley Houseman, LPN Outcome: Progressing 08/19/2022 0240 by Smiley Houseman, LPN Outcome: Progressing Goal: Respiratory complications will improve 08/19/2022 0241 by Smiley Houseman, LPN Outcome: Progressing 08/19/2022 0240 by Smiley Houseman, LPN Outcome: Progressing Goal: Cardiovascular complication will be avoided 08/19/2022 0241 by Smiley Houseman, LPN Outcome: Progressing 08/19/2022 0240 by Smiley Houseman, LPN Outcome: Progressing   Problem: Activity: Goal: Risk for activity intolerance will decrease 08/19/2022 0241 by Smiley Houseman, LPN Outcome: Progressing 08/19/2022 0240 by Smiley Houseman, LPN Outcome: Progressing   Problem: Nutrition: Goal: Adequate nutrition will be maintained 08/19/2022 0241 by Smiley Houseman, LPN Outcome: Progressing 08/19/2022 0240 by Smiley Houseman, LPN Outcome: Progressing   Problem: Coping: Goal: Level of anxiety will decrease 08/19/2022 0241 by Smiley Houseman, LPN Outcome: Progressing 08/19/2022 0240 by Smiley Houseman,  LPN Outcome: Progressing   Problem: Elimination: Goal: Will not experience complications related to bowel motility 08/19/2022 0241 by Smiley Houseman, LPN Outcome: Progressing 08/19/2022 0240 by Smiley Houseman, LPN Outcome: Progressing Goal: Will not experience complications related to urinary retention 08/19/2022 0241 by Smiley Houseman, LPN Outcome: Progressing 08/19/2022 0240 by Smiley Houseman, LPN Outcome: Progressing   Problem: Pain Managment: Goal: General experience of comfort will improve 08/19/2022 0241 by Smiley Houseman, LPN Outcome: Progressing 08/19/2022 0240 by Smiley Houseman, LPN Outcome: Progressing   Problem: Safety: Goal: Ability to remain free from injury will improve 08/19/2022 0241 by Smiley Houseman, LPN Outcome: Progressing 08/19/2022 0240 by Smiley Houseman, LPN Outcome: Progressing   Problem: Skin Integrity: Goal: Risk for impaired skin integrity will decrease 08/19/2022 0241 by Smiley Houseman, LPN Outcome: Progressing 08/19/2022 0240 by Smiley Houseman, LPN Outcome: Progressing

## 2022-08-19 NOTE — Procedures (Signed)
Routine EEG Report  Andrew Cummings is a 42 y.o. male with a history of seizure who is undergoing an EEG to evaluate for seizures.  Report: This EEG was acquired with electrodes placed according to the International 10-20 electrode system (including Fp1, Fp2, F3, F4, C3, C4, P3, P4, O1, O2, T3, T4, T5, T6, A1, A2, Fz, Cz, Pz). The following electrodes were missing or displaced: none.  The occipital dominant rhythm was 9-10 Hz with overriding beta frequencies. This activity is reactive to stimulation. Drowsiness was manifested by background fragmentation; deeper stages of sleep were not identified. There was no focal slowing. There were no interictal epileptiform discharges. There were no electrographic seizures identified. There was no abnormal response to photic stimulation or hyperventilation.   Impression: This EEG was obtained while awake and drowsy and is normal.    Clinical Correlation: Normal EEGs, however, do not rule out epilepsy.  [name], MD [facility] [phone]  If 7pm- 7am, please page neurology on call as listed in AMION.

## 2022-08-19 NOTE — Progress Notes (Signed)
Progress Note   Patient: Andrew Cummings WUJ:[id number] DOB: [birth year] DOA: 08/17/2022     2 DOS: the patient was seen and examined on 08/19/2022    Subjective:  Patient started experiencing bleeding from the lateral aspect of the tongue on the left where he sustained a laceration from the bite during the seizure this morning. I consulted ENT surgeon who came in to see patient.  Bleeding thought to be due to severe thrombocytopenia Platelets being transfused as well as vitamin K given. Denies worsening headache abdominal pain chest pain or cough     Brief hospital course: Andrew Cummings is a 42 y.o. African-American male with medical history significant for alcohol dependence, alcoholic liver cirrhosis and liver cell failure, and history of bilateral shoulder dislocation, who presented to the emergency room with acute onset of seizures.  The patient has a tongue bite secondary to seizure as well as left shoulder dislocation that was reduced in the ER.  He denies any fever or chills.  No nausea or vomiting or abdominal pain.  No dysuria, oliguria or hematuria or flank pain.  No chest pain or palpitations.  No cough or wheezing or hemoptysis.  No paresthesias or focal muscle weakness.    Assessment and Plan:    Tongue laceration with bleeding Patient started experiencing bleeding from the lateral aspect of the tongue on the left where he sustained a laceration from the bite during the seizure. I consulted ENT surgeon who came in to see patient.  Bleeding thought to be due to severe thrombocytopenia Platelets being transfused as well as vitamin K given. I have discussed the case with ENT surgeon and we appreciate input Continue to monitor platelet closely   Alcohol withdrawal seizure [facility] - The patient will be admitted to a medical telemetry bed. - The patient will be placed on seizure precautions. - Continue CIWA protocol. - Continue as needed Ativan for seizures - EEG results  still pending - Neurology on board and does not recommend any antiepileptic drugs - I personally discussed the case with neurologist Dr. Selina Cooley   Alcoholic cirrhosis of liver with ascites (HCC) - This is associated with liver cell failure, manifested by severe  thrombocytopenia and coagulopathy with INR of 2.2 and PT 25. - T continue PPI therapy -Monitor platelet level We will transfuse 1 unit of platelets today as well as IM vitamin K given ongoing bleeding - Continue lactulose and Aldactone. -GI is on standby     Anterior dislocation of left shoulder - This was reduced in the ER.   Elevated LFTs - LFTs have worsened from October 2023. - This is like secondary to alcoholic hepatitis and liver cirrhosis.   GERD without esophagitis - The patient will be placed on IV PPI therapy as mentioned above.       DVT prophylaxis: Continue SCD, holding pharmacologic agent on account of coagulopathy   Advanced Care Planning:  Code Status: full code.   Family Communication: None present at bedside at this time     Physical Exam:   GENERAL: Blood noted in the MOUTH EYES: Pupils equal HEENT: Head atraumatic,  Tongue bite noted to the left side of the tongue NECK:  Supple LUNGS: Normal breath sounds bilaterally CARDIOVASCULAR: Regular rate and rhythm, S1, S2 normal.  ABDOMEN: Soft, nondistended, nontender.  EXTREMITIES: No pedal edema, cyanosis, or clubbing.  NEUROLOGIC: Cranial nerves II through XII are intact.  PSYCHIATRIC: Awake and alert x 3 SKIN: No obvious rash, lesion, or ulcer.  Data Reviewed: I have reviewed patient's documentation including labs, vitals as well as ENT surgeon documentation  Vitals:   08/18/22 2347 08/19/22 0357 08/19/22 1120 08/19/22 1449  BP: 122/88 102/72 108/65 106/71  Pulse: 75 93 87 89  Resp:  18 17   Temp: 99.4 F (37.4 C) 97.7 F (36.5 C) 98.2 F (36.8 C) 98.4 F (36.9 C)  TempSrc: Oral Oral  Oral  SpO2: 100% 99% 100%   Weight:       Height:         Author: [name], MD 08/19/2022 3:05 PM  For on call review [email or web address].

## 2022-08-19 NOTE — Plan of Care (Signed)

## 2022-08-19 NOTE — Plan of Care (Signed)

## 2022-08-19 NOTE — Progress Notes (Signed)
Pt high fall risks refusing fall intervention see safety flowsheet.Refuses assitance while ambulating. Education prvided per LPN and Charity fundraiser. Refusal stands.

## 2022-08-19 NOTE — Progress Notes (Signed)
Pt noted with elevated heart rate per telemetry monitor, this nurse entered room to eval pt. Pt  observed in his bed, sheets were bloody and there were bloody tissues and large clots. Practitioner notified with orders stat labs. New order for mag 4mg  IV received.

## 2022-08-19 NOTE — Consult Note (Signed)
Andrew Cummings, Andrew Cummings [id number] [birth year] Andrew Dys, MD  Reason for Consult: Tongue laceration  HPI: 42 year old male with history of alcoholic cirrhosis admitted following seizure.  At the time of the seizure he was noted to have dental trauma to left lateral tongue.  This initially was not an issue until last night when he developed bleeding from his trauma site.  This was estimated as of fluid and treated with gauze.  The bleeding has since stopped.  He denies any breathing difficulty.  The tongue is tender and swollen.  No prior history of similar trauma.  He currently is on CIWA protocol.    Allergies: No Known Allergies  ROS: Review of systems normal other than 12 systems except per HPI.  PMH:  Past Medical History:  Diagnosis Date   Alcohol abuse    History of closed shoulder dislocation    bilateral    FH: History reviewed. No pertinent family history.  SH:  Social History   Socioeconomic History   Marital status: Married    Spouse name: Not on file   Number of children: Not on file   Years of education: Not on file   Highest education level: Not on file  Occupational History   Not on file  Tobacco Use   Smoking status: Former    Types: Cigarettes   Smokeless tobacco: Never  Vaping Use   Vaping status: Never Used  Substance and Sexual Activity   Alcohol use: Yes    Comment: 1 bottle wine daily   Drug use: Not Currently   Sexual activity: Not on file  Other Topics Concern   Not on file  Social History Narrative   Not on file   Social Determinants of Health   Financial Resource Strain: Not on file  Food Insecurity: No Food Insecurity (08/18/2022)   Hunger Vital Sign    Worried About Running Out of Food in the Last Year: Never true    Ran Out of Food in the Last Year: Never true  Transportation Needs: No Transportation Needs (08/18/2022)   PRAPARE - Administrator, Civil Service (Medical): No    Lack of Transportation  (Non-Medical): No  Physical Activity: Not on file  Stress: Not on file  Social Connections: Not on file  Intimate Partner Violence: Not At Risk (08/18/2022)   Humiliation, Afraid, Rape, and Kick questionnaire    Fear of Current or Ex-Partner: No    Emotionally Abused: No    Physically Abused: No    Sexually Abused: No    PSH:  Past Surgical History:  Procedure Laterality Date   ESOPHAGOGASTRODUODENOSCOPY (EGD) WITH PROPOFOL N/A 10/30/2021   Procedure: ESOPHAGOGASTRODUODENOSCOPY (EGD) WITH PROPOFOL;  Surgeon: Wyline Mood, MD;  Location: [facility] ENDOSCOPY;  Service: Gastroenterology;  Laterality: N/A;    Physical  Exam:  GEN-  sitting upright in bed, NAD NEURO-  CN 2-12 grossly intact and symmetric. EARS- external ears clear OC/OP-  edematous and ecchymosis of left lateral tongue with areas of abrasion and 2 focal areas of puncture.  Small amount of bloody ooze from anterior lateral puncture wound.  Larger posterior abrasion superficial with dried clot present. NECK- no lad RESP- unlabored  A/P: Tongue laceration with hemorrhage  Plan:  Improved bleeding with pressure.  Recommend ICE to oral cavity and ice water to help decrease bleeding.  Continue intermittent use of gauze.  No significant bleeding currently and given 48 hours since trauma and puncture wound risk of infection is present  if suture enough to control pain.  Significant thrombocytopenia and non-functional platelets so agree with correction of platelets.  Will recheck after correction in morning and if continues to ooze, can consider suture closure at that time.   Andrew Cummings 08/19/2022 11:52 AM

## 2022-08-19 NOTE — Progress Notes (Signed)
With assuming care of Pt this morning this Pt was bleeding from tongue. Upon assessment had Pt hold gauze over site to attempt to stop bleeding, bleeding continued with an hr of pressure. Rosezetta Schlatter notified of continuous bleeding. Surgery consulted to rule out need for sutures and new order placed to include platelets and Vit K. Administer see MAR Pt current no longer bleeding will continue to monitor and notify oncoming nurse.

## 2022-08-20 ENCOUNTER — Other Ambulatory Visit: Payer: Self-pay

## 2022-08-20 LAB — CBC WITH DIFFERENTIAL/PLATELET
Abs Immature Granulocytes: 0.04 10*3/uL (ref 0.00–0.07)
Basophils Absolute: 0 10*3/uL (ref 0.0–0.1)
Basophils Relative: 1 %
Eosinophils Absolute: 0.1 10*3/uL (ref 0.0–0.5)
Eosinophils Relative: 2 %
HCT: 19.4 % — ABNORMAL LOW (ref 39.0–52.0)
Hemoglobin: 6.9 g/dL — ABNORMAL LOW (ref 13.0–17.0)
Immature Granulocytes: 1 %
Lymphocytes Relative: 32 %
Lymphs Abs: 1.7 10*3/uL (ref 0.7–4.0)
MCH: 34.2 pg — ABNORMAL HIGH (ref 26.0–34.0)
MCHC: 35.6 g/dL (ref 30.0–36.0)
MCV: 96 fL (ref 80.0–100.0)
Monocytes Absolute: 0.8 10*3/uL (ref 0.1–1.0)
Monocytes Relative: 15 %
Neutro Abs: 2.6 10*3/uL (ref 1.7–7.7)
Neutrophils Relative %: 49 %
Platelets: 79 10*3/uL — ABNORMAL LOW (ref 150–400)
RBC: 2.02 MIL/uL — ABNORMAL LOW (ref 4.22–5.81)
RDW: 19 % — ABNORMAL HIGH (ref 11.5–15.5)
WBC: 5.2 10*3/uL (ref 4.0–10.5)
nRBC: 0.6 % — ABNORMAL HIGH (ref 0.0–0.2)

## 2022-08-20 LAB — PREPARE RBC (CROSSMATCH)

## 2022-08-20 MED ORDER — LACTULOSE 10 GM/15ML PO SOLN
30.0000 g | Freq: Two times a day (BID) | ORAL | 0 refills | Status: AC | PRN
  Filled 2022-08-20: qty 1419, 16d supply, fill #0

## 2022-08-20 MED ORDER — ONDANSETRON HCL 4 MG PO TABS
4.0000 mg | ORAL_TABLET | Freq: Four times a day (QID) | ORAL | 0 refills | Status: DC | PRN
  Filled 2022-08-20: qty 20, 5d supply, fill #0

## 2022-08-20 MED ORDER — PANTOPRAZOLE SODIUM 40 MG PO TBEC
40.0000 mg | DELAYED_RELEASE_TABLET | Freq: Two times a day (BID) | ORAL | Status: DC
  Administered 2022-08-20 (×2): 40 mg via ORAL
  Filled 2022-08-20 (×3): qty 1

## 2022-08-20 MED ORDER — VITAMIN B-1 100 MG PO TABS
100.0000 mg | ORAL_TABLET | Freq: Every day | ORAL | 0 refills | Status: DC
  Filled 2022-08-20: qty 30, 30d supply, fill #0

## 2022-08-20 MED ORDER — FOLIC ACID 1 MG PO TABS
1.0000 mg | ORAL_TABLET | Freq: Every day | ORAL | 0 refills | Status: AC
  Filled 2022-08-20: qty 30, 30d supply, fill #0

## 2022-08-20 MED ORDER — TRAZODONE HCL 50 MG PO TABS
25.0000 mg | ORAL_TABLET | Freq: Every evening | ORAL | 0 refills | Status: AC | PRN
  Filled 2022-08-20: qty 30, 30d supply, fill #0

## 2022-08-20 MED ORDER — SODIUM CHLORIDE 0.9% IV SOLUTION: Freq: Once | INTRAVENOUS | Status: AC

## 2022-08-20 MED ORDER — OMEPRAZOLE 20 MG PO CPDR
40.0000 mg | DELAYED_RELEASE_CAPSULE | Freq: Every day | ORAL | 0 refills | Status: DC
  Filled 2022-08-20: qty 60, 30d supply, fill #0

## 2022-08-20 NOTE — Progress Notes (Signed)
..  08/20/2022 8:28 AM  Andrew Cummings [id number]    Temp:  [98 F (36.7 C)-99 F (37.2 C)] 98.6 F (37 C) (08/02 0023) Pulse Rate:  [81-99] 99 (08/02 0023) Resp:  [16-18] 16 (08/02 0023) BP: (106-135)/(65-73) 109/67 (08/02 0023) SpO2:  [98 %-100 %] 100 % (08/02 0023),     Intake/Output Summary (Last 24 hours) at 08/20/2022 2595 Last data filed at 08/19/2022 1916 Gross per 24 hour  Intake 2457.57 ml  Output --  Net 2457.57 ml    Results for orders placed or performed during the hospital encounter of 08/17/22 (from the past 24 hour(s))  Prepare platelet pheresis     Status: None (Preliminary result)   Collection Time: 08/19/22 10:08 AM  Result Value Ref Range   Unit Number [id number]    Blood Component Type PLTPH LR2 7D    Unit division 00    Status of Unit ISSUED    Transfusion Status      OK TO TRANSFUSE Performed at [facility], [address] [place], [place] [address]   Type and screen [facility]     Status: None   Collection Time: 08/19/22 11:13 AM  Result Value Ref Range   ABO/RH(D) A POS    Antibody Screen NEG    Sample Expiration      08/22/2022,2359 Performed at [facility], [address] [place], [place] [address]   Comprehensive metabolic panel     Status: Abnormal   Collection Time: 08/20/22  3:49 AM  Result Value Ref Range   Sodium 135 135 - 145 mmol/L   Potassium 4.3 3.5 - 5.1 mmol/L   Chloride 105 98 - 111 mmol/L   CO2 24 22 - 32 mmol/L   Glucose, Bld 78 70 - 99 mg/dL   BUN 15 6 - 20 mg/dL   Creatinine, Ser 1.60 0.61 - 1.24 mg/dL   Calcium 7.9 (L) 8.9 - 10.3 mg/dL   Total Protein 5.7 (L) 6.5 - 8.1 g/dL   Albumin 2.1 (L) 3.5 - 5.0 g/dL   AST 109 (H) 15 - 41 U/L   ALT 44 0 - 44 U/L   Alkaline Phosphatase 85 38 - 126 U/L   Total Bilirubin 6.2 (H) 0.3 - 1.2 mg/dL   GFR, Estimated >32 >35 mL/min   Anion gap 6 5 - 15    SUBJECTIVE:  No acute events overnight per nursing  OBJECTIVE:  GEN-  sleeping in bed open mouthed OC/OP-  laceration to left lateral tongue with clot adjacent to it  IMPRESSION:  Puncture laceration on 7/30 with thrombocytopenia and bleeding on 8/1.  PLAN:  No post-tranfusion platelet number seen today.  Given improvement in bleeding with time recommend continued conservative measures of pressure/ICE/platelet transfusion/Vitamin K.  Given timing of injury if bleeding controlled with conservative measures no role for closure at this time.  Please re-consult ENT on call if issue develops.    08/20/2022, 8:28 AM

## 2022-08-20 NOTE — Discharge Instructions (Signed)

## 2022-08-20 NOTE — TOC CM/SW Note (Signed)
Transition of Care [facility]) - Inpatient Brief Assessment   Patient Details  Name: Andrew Cummings MRN: [id number] Date of Birth: 05/16/1980  Transition of Care [facility]) CM/SW Contact:    Allena Katz, LCSW Phone Number: 08/20/2022, 11:15 AM   Clinical Narrative:   Substance use resources added to AVS.     Transition of Care Asessment: Insurance and Status: Insurance coverage has been reviewed Patient has primary care physician: Yes Home environment has been reviewed: [id number] [facility] [place] [id number] Prior level of function:: independent Prior/Current Home Services: No current home services Social Determinants of Health Reivew: SDOH reviewed no interventions necessary Readmission risk has been reviewed: Yes Transition of care needs: no transition of care needs at this time

## 2022-08-20 NOTE — Progress Notes (Signed)
Progress Note   Patient: Andrew Cummings CZY:[id number] DOB: 30-Mar-1980 DOA: 08/17/2022     3 DOS: the patient was seen and examined on 08/20/2022     Subjective:  Patient seen and examined at bedside this morning Bleeding from the tongue is resolved Denies nausea vomiting abdominal pain or chest pain Liver function test improving Platelet level much better today 79 Hemoglobin noted to be 6.9 for which patient is being transfused a unit of blood   Brief hospital course: Andrew Cummings is a 42 y.o. African-American male with medical history significant for alcohol dependence, alcoholic liver cirrhosis and liver cell failure, and history of bilateral shoulder dislocation, who presented to the emergency room with acute onset of seizures.  The patient has a tongue bite secondary to seizure as well as left shoulder dislocation that was reduced in the ER.  He denies any fever or chills.  No nausea or vomiting or abdominal pain.  No dysuria, oliguria or hematuria or flank pain.  No chest pain or palpitations.  No cough or wheezing or hemoptysis.  No paresthesias or focal muscle weakness.    Assessment and Plan:     Tongue laceration with bleeding Acute blood loss anemia secondary to tongue laceration Patient started experiencing bleeding from the lateral aspect of the tongue on the left where he sustained a laceration from the bite during the seizure. I consulted ENT surgeon who came in to see patient.  Bleeding thought to be due to severe thrombocytopenia Patient received a unit of platelet transfusion yesterday with improvement in platelet counts ENT surgeon has seen patient today and case discussed We will give 1 unit of packed RBC today Continue to monitor CBC    Alcohol withdrawal seizure (HCC) - The patient will be admitted to a medical telemetry bed. - The patient will be placed on seizure precautions. - Continue CIWA protocol - Continue as needed Ativan for seizures - EEG  report reviewed and normal - Neurology on board and does not recommend any antiepileptic drugs - I personally discussed the case with neurologist Dr. Selina Cooley   Alcoholic cirrhosis of liver with ascites (HCC) - This is associated with liver cell failure, manifested by severe  thrombocytopenia and coagulopathy with INR of 2.2 and PT 25. - T continue PPI therapy - Continue to monitor platelet level Patient received a unit of platelet transfusion yesterday as well as vitamin K administration - Continue lactulose and Aldactone. -GI is on standby     Anterior dislocation of left shoulder - This was reduced in the ER.   Elevated LFTs-improved - LFTs have worsened from October 2023. - This is like secondary to alcoholic hepatitis and liver cirrhosis. LFTs continue to improve   GERD without esophagitis - The patient will be placed on IV PPI therapy as mentioned above.       DVT prophylaxis: Continue SCD, holding pharmacologic agent on account of coagulopathy   Advanced Care Planning:  Code Status: full code.   Family Communication: Discussed with patient's father present at bedside   Physical Exam:   GENERAL: Bleeding in the mouth resolved EYES: Pupils equal HEENT: Head atraumatic,  Tongue bite noted to the left side of the tongue NECK:  Supple LUNGS: Normal breath sounds bilaterally CARDIOVASCULAR: Regular rate and rhythm, S1, S2 normal.  ABDOMEN: Soft, nondistended, nontender.  EXTREMITIES: No pedal edema, cyanosis, or clubbing.  NEUROLOGIC: Cranial nerves II through XII are intact.  PSYCHIATRIC: Awake and alert x 3 SKIN: No obvious rash, lesion, or ulcer.  Data Reviewed: I have reviewed patient's CBC including low hemoglobin and improving platelet, ENT surgeon documentation, nursing documentation, vitals     Vitals:   08/20/22 0830 08/20/22 1203 08/20/22 1520 08/20/22 1523  BP: 114/67 (!) 90/53  105/70  Pulse: 89 81  93  Resp: 16 16 16    Temp: 98.9 F (37.2 C) 98.5  F (36.9 C)  98.2 F (36.8 C)  TempSrc: Oral Oral  Oral  SpO2: 100% 100%    Weight:      Height:         Author: [name], MD 08/20/2022 3:51 PM  For on call review [email or web address].

## 2022-08-20 NOTE — Progress Notes (Signed)
PHARMACIST - PHYSICIAN COMMUNICATION  DR:   Rosezetta Schlatter  CONCERNING: IV to Oral Route Change Policy  RECOMMENDATION: This patient is receiving 40 mg Pantoprazole by the intravenous route.  Based on criteria approved by the Pharmacy and Therapeutics Committee, the intravenous medication(s) is/are being converted to the equivalent oral dose form(s).   DESCRIPTION: These criteria include: The patient is eating (either orally or via tube) and/or has been taking other orally administered medications for a least 24 hours The patient has no evidence of active gastrointestinal bleeding or impaired GI absorption (gastrectomy, short bowel, patient on TNA or NPO).  If you have questions about this conversion, please contact the Pharmacy Department  []   [phone] )  [facility] [x]   [phone] )  [facility] []   [phone] )  [facility] []   [phone] )  [facility] []   [phone] )  [facility]   [name], [facility] 08/20/2022 7:38 AM

## 2022-08-20 NOTE — Progress Notes (Signed)
Pt hemoglobin 6.9 MD notified at 1206  and RBC ordered

## 2022-08-20 NOTE — Plan of Care (Signed)

## 2022-08-21 NOTE — Discharge Summary (Signed)
  Physician Discharge Summary   Patient: Andrew Cummings MRN: [id number] DOB: 03-14-1980  Admit date:     08/17/2022  Discharge date: 08/21/22  Discharge Physician: [name]   PCP: Farris Has, MD    Discharge Diagnoses: Tongue laceration with bleeding Acute blood loss anemia secondary to tongue laceration Alcohol withdrawal seizure (HCC) Alcoholic cirrhosis of liver with ascites (HCC) Patient received a unit of platelet transfusion yesterday as well as vitamin K administration Anterior dislocation of left shoulder Elevated LFTs-improved GERD without esophagitis  Hospital Course: Miguel Groman is a 42 y.o. African-American male with medical history significant for alcohol dependence, alcoholic liver cirrhosis and liver cell failure, and history of bilateral shoulder dislocation, who presented to the emergency room with acute onset of seizures.  The patient has a tongue bite secondary to seizure as well as left shoulder dislocation that was reduced in the ER.  He denies any fever or chills.  Currently not having any alcohol withdrawal, liver function tests have improved and patient has now been cleared for discharge today and to follow-up with primary care physician  Consultants: none  Procedures performed: none   Disposition: Home  Diet recommendation:  Cardiac diet DISCHARGE MEDICATION: Allergies as of 08/21/2022   No Known Allergies      Medication List     STOP taking these medications    atovaquone-proguanil 250-100 MG Tabs tablet Commonly known as: MALARONE   furosemide 40 MG tablet Commonly known as: LASIX   multivitamin with minerals Tabs tablet   omeprazole 20 MG tablet Commonly known as: PriLOSEC OTC Replaced by: omeprazole 20 MG capsule   spironolactone 100 MG tablet Commonly known as: ALDACTONE       TAKE these medications    folic acid 1 MG tablet Commonly known as: FOLVITE Take 1 tablet (1 mg total) by mouth daily.   lactulose 10  GM/15ML solution Commonly known as: CHRONULAC Take 45 mLs (30 g total) by mouth 2 (two) times daily as needed for mild constipation or moderate constipation. What changed:  when to take this reasons to take this   omeprazole 20 MG capsule Commonly known as: PRILOSEC Take 2 capsules (40 mg total) by mouth daily. Replaces: omeprazole 20 MG tablet   ondansetron 4 MG tablet Commonly known as: ZOFRAN Take 1 tablet (4 mg total) by mouth every 6 (six) hours as needed for nausea. What changed:  medication strength how much to take when to take this reasons to take this   thiamine 100 MG tablet Commonly known as: VITAMIN B1 Take 1 tablet (100 mg total) by mouth daily.   traZODone 50 MG tablet Commonly known as: DESYREL Take 0.5-1 tablets (25-50 mg total) by mouth at bedtime as needed.        Discharge Exam: Filed Weights   08/17/22 1734 08/18/22 1324  Weight: 81.6 kg 87.1 kg     Condition at discharge: good    Discharge time spent:  34 minutes.  Signed: [name], MD [facility] 08/21/2022

## 2022-09-19 ENCOUNTER — Other Ambulatory Visit: Payer: Self-pay

## 2023-08-23 ENCOUNTER — Ambulatory Visit: Payer: Self-pay

## 2023-08-23 NOTE — Telephone Encounter (Signed)
  Called Nurse Triage reporting Foot Swelling, trouble eating, and Urinary Retention.  Symptoms began about a month ago.  Interventions attempted: Rest, hydration, or home remedies.  Symptoms are: gradually worsening.  Triage Disposition: Go to ED Now (or PCP Triage)  Patient/caregiver understands and will follow disposition?: Yes  Copied from CRM [id number]. Topic: Clinical - Red Word Triage >> Aug 23, 2023  2:08 PM Rea C wrote: Red Word that prompted transfer to Nurse Triage: Two days ago, patient's shoulder dislocated but he said it went back in. He didn't go to the ER for it.  Having trouble eating, drinking a lot of water, not urinating regularly, feet is swollen unless he stays off of them for days. Reason for Disposition  Patient sounds very sick or weak to the triager  Patient sounds very sick or weak to the triager  Patient sounds very sick or weak to the triager  Answer Assessment - Initial Assessment Questions 1. SYMPTOM: What's the main symptom you're concerned about? (e.g., frequency, incontinence)     Possible urinary retention-patient believes he has been retaining fluid. 2. ONSET: When did the  urinary retention  start?     Over a month 3. PAIN: Is there any pain? If Yes, ask: How bad is it? (Scale: 1-10; mild, moderate, severe)     no 4. CAUSE: What do you think is causing the symptoms?     unsure 5. OTHER SYMPTOMS: Do you have any other symptoms? (e.g., blood in urine, fever, flank pain, pain with urination)     no  Patient reports that he has drank 80 oz of water today and has only gone once today.  Answer Assessment - Initial Assessment Questions 1. ONSET: When did the pain start?      Patient reports pain with swelling to his feet that started up again a month ago 2. LOCATION: Where is the pain located?      Bilateral feet 3. PAIN: How bad is the pain?    (Scale 1-10; or mild, moderate, severe)     mild 4. WORK OR EXERCISE: Has there  been any recent work or exercise that involved this part of the body?      no 5. CAUSE: What do you think is causing the foot pain?     Swelling to feet 6. OTHER SYMPTOMS: Do you have any other symptoms? (e.g., leg pain, rash, fever, numbness)     no  Answer Assessment - Initial Assessment Questions 1. MAIN CONCERN: What is your main concern today?     Along with other symptoms patient reports weight loss due to no appetite.  2. WEIGHT LOSS: How much weight have you lost?  (e.g., lbs., kgs.)  Over what period of time have you lost this weight?  (e.g., number of days, weeks, months, years)     15 pounds over two weeks 4. CAUSE: What do you think is causing the weight loss? (e.g., depression, anxiety, medicine side effect, pain, trouble swallowing, substance or alcohol  use problem, eating disorder)     Unsure but has multiple health concerns 7. OTHER SYMPTOMS: Do you have any other symptoms? (e.g., anxiety or depression, blood in stool, breathing difficulty, diarrhea, fever, trouble swallowing)     Feet swelling, difficulty urinating-where unable to go-patient reports only going once today.  Protocols used: Foot Pain-A-AH, Urinary Symptoms-A-AH, Weight Loss - Unintended-A-AH

## 2023-09-05 ENCOUNTER — Ambulatory Visit: Payer: Self-pay

## 2023-09-05 NOTE — Telephone Encounter (Signed)
 Patient requesting an MRI to check his liver. Patient advised that he would need a doctor to put in that order. Patient verbalized understanding and states he would go see a provider local to him and call back if needed.     Copied from CRM #[id number]. Topic: Clinical - Red Word Triage >> Sep 05, 2023  2:20 PM Gennette ORN wrote: Patient is having in both feet and shoulders. This has been going on for two weeks. The left shoulder popped out as well.  Patient has taken Ibuprofen.    Reason for Disposition  [1] MILD swelling of both ankles (i.e., pedal edema) AND [2] new-onset or getting worse  Answer Assessment - Initial Assessment Questions 1. ONSET: When did the swelling start? (e.g., minutes, hours, days)     Chronic problem, worse over the last 2 weeks  2. LOCATION: What part of the leg is swollen?  Are both legs swollen or just one leg?     Bilateral feet 3. SEVERITY: How bad is the swelling? (e.g., localized; mild, moderate, severe)     Mild to moderate  5. PAIN: Is the swelling painful to touch? If Yes, ask: How painful is it?   (Scale 1-10; mild, moderate or severe)     Mild to moderate  6. FEVER: Do you have a fever? If Yes, ask: What is it, how was it measured, and when did it start?      No 7. CAUSE: What do you think is causing the leg swelling?     History of cirrhosis  8. MEDICAL HISTORY: Do you have a history of blood clots (e.g., DVT), cancer, heart failure, kidney disease, or liver failure?     Cirrhosis of the liver  9. RECURRENT SYMPTOM: Have you had leg swelling before? If Yes, ask: When was the last time? What happened that time?     Yes 10. OTHER SYMPTOMS: Do you have any other symptoms? (e.g., chest pain, difficulty breathing)       Shoulder pain, states his shoulder recently dislocated and he is still experiencing some pan  Protocols used: Leg Swelling and Edema-A-AH

## 2023-09-09 DIAGNOSIS — R7989 Other specified abnormal findings of blood chemistry: Secondary | ICD-10-CM | POA: Diagnosis not present

## 2023-09-27 DIAGNOSIS — M25511 Pain in right shoulder: Secondary | ICD-10-CM | POA: Diagnosis not present

## 2023-09-27 DIAGNOSIS — M25512 Pain in left shoulder: Secondary | ICD-10-CM | POA: Diagnosis not present

## 2023-10-17 DIAGNOSIS — R945 Abnormal results of liver function studies: Secondary | ICD-10-CM | POA: Diagnosis not present

## 2023-10-21 ENCOUNTER — Other Ambulatory Visit: Payer: Self-pay

## 2023-10-21 DIAGNOSIS — F411 Generalized anxiety disorder: Secondary | ICD-10-CM | POA: Insufficient documentation

## 2023-10-21 DIAGNOSIS — G4700 Insomnia, unspecified: Secondary | ICD-10-CM | POA: Insufficient documentation

## 2023-10-21 DIAGNOSIS — R4589 Other symptoms and signs involving emotional state: Secondary | ICD-10-CM | POA: Insufficient documentation

## 2023-10-21 DIAGNOSIS — K760 Fatty (change of) liver, not elsewhere classified: Secondary | ICD-10-CM | POA: Insufficient documentation

## 2023-10-21 DIAGNOSIS — Z789 Other specified health status: Secondary | ICD-10-CM | POA: Insufficient documentation

## 2023-10-21 DIAGNOSIS — J309 Allergic rhinitis, unspecified: Secondary | ICD-10-CM | POA: Insufficient documentation

## 2023-10-24 NOTE — Progress Notes (Unsigned)
 10/25/2023 [name] [id number] 04/25/1980  Gastroenterology Office Note    Referring Provider: Kip Righter, MD Primary Care Physician:  Kip Righter, MD  Primary GI Provider: Jinny Carmine, MD    Chief Complaint   Chief Complaint  Patient presents with   Other    Cirrhosis elevated LFT's- no symptoms today     History of Present Illness   Andrew Cummings is a 43 y.o. male with PMHX of alcohol  dependence, alcoholic liver cirrhosis, bilateral shoulder displacement, presenting today to discuss elevated LFT's and clearance for shoulder surgery.  Patient reports he is planning to have left shoulder surgery and surgeon concerned about his blood counts.   Patient was last seen in this office by Dr. Therisa on 10/19/2021. States he has not followed up with GI since then. Patient reports he stopped drinking alcohol  from Aug 2024-Apr 2025. He started back occasionally drinking in May 2025, 2-3 times a month. In the last month, he drank very heavily on 2 different occasions, drank white wine all day long, was unsure of total amount.   Continues to take lactulose  and drinks about a gallon of water a day.   10/30/2021 upper endoscopy - Grade III esophageal varices. Incompletely eradicated. Banded.  - A medium amount of food (residue) in the stomach.  - Normal examined duodenum.  - No specimens collected.  Impression:  - Continue present medications.  - Repeat upper endoscopy in 4 weeks to evaluate the response to therapy  10/01/2021: US  ABD RUQ IMPRESSION:  1. Cirrhotic changes involving the liver with reversed flow in the portal vein. No worrisome hepatic lesions or intrahepatic biliary dilatation. Moderate volume abdominal ascites. 2. Markedly thickened gallbladder wall likely related to the surrounding ascites and probable low albumin . No gallstones.  03/13/2021 US  ABD RUQ IMPRESSION: Diffuse hepatic attenuation consistent with fatty replacement with slightly  coarsened echotexture. Similar findings on last year's ultrasound with hepatic steatosis also noted on MRI. Small cyst in hepatic dome. No other focal lesion is seen through the steatosis. Gallbladder and bile ducts unremarkable.  10/01/2021 celiac serology, alpha-1 antitrypsin was normal.  Smooth muscle antibody was strongly positive at 59.  Antimitochondrial antibody was negative.  ANA negative.  Iron studies not performed ceruloplasmin low at 15.2 hepatitis B surface antigen nonreactive varicella-zoster PCR negative hepatitis B virus DNA not detected hepatitis A antibody nonreactive hepatitis C antibody nonreactive hepatitis B antigen, BE antibody, B core antibody nonreactive HIV nonreactive.    Past Medical History:  Diagnosis Date   Alcohol  abuse    Allergic rhinitis 10/21/2023   Current drinker of alcohol  10/21/2023   Depressed mood 10/21/2023   Generalized anxiety disorder 10/21/2023   History of closed shoulder dislocation    bilateral   Insomnia 10/21/2023   Steatosis of liver 10/21/2023    Past Surgical History:  Procedure Laterality Date   ESOPHAGOGASTRODUODENOSCOPY (EGD) WITH PROPOFOL  N/A 10/30/2021   Procedure: ESOPHAGOGASTRODUODENOSCOPY (EGD) WITH PROPOFOL ;  Surgeon: Therisa Bi, MD;  Location: [facility] ENDOSCOPY;  Service: Gastroenterology;  Laterality: N/A;    Current Outpatient Medications  Medication Sig Dispense Refill   doxepin (SINEQUAN) 10 MG capsule 1 capsule at bedtime as needed Orally Once a day; Duration: 30 days     folic acid  (FOLVITE ) 1 MG tablet Take 1 tablet (1 mg total) by mouth daily. 30 tablet 0   lactulose  (CHRONULAC ) 10 GM/15ML solution Take 45 mLs (30 g total) by mouth 2 (two) times daily as needed for mild constipation or moderate constipation. 2730 mL  0   traZODone  (DESYREL ) 50 MG tablet Take 0.5-1 tablets (25-50 mg total) by mouth at bedtime as needed. 30 tablet 0   No current facility-administered medications for this visit.    Allergies as of  10/25/2023   (No Known Allergies)    Family History  Problem Relation Age of Onset   Diabetes Father     Social History   Socioeconomic History   Marital status: Married    Spouse name: Not on file   Number of children: Not on file   Years of education: Not on file   Highest education level: Not on file  Occupational History   Not on file  Tobacco Use   Smoking status: Former    Types: Cigarettes   Smokeless tobacco: Never  Vaping Use   Vaping status: Never Used  Substance and Sexual Activity   Alcohol  use: Yes    Comment: occasionally drinks   Drug use: Not Currently   Sexual activity: Not on file  Other Topics Concern   Not on file  Social History Narrative   Not on file   Social Drivers of Health   Financial Resource Strain: Not on file  Food Insecurity: No Food Insecurity (08/18/2022)   Hunger Vital Sign    Worried About Running Out of Food in the Last Year: Never true    Ran Out of Food in the Last Year: Never true  Transportation Needs: No Transportation Needs (08/18/2022)   PRAPARE - Administrator, Civil Service (Medical): No    Lack of Transportation (Non-Medical): No  Physical Activity: Not on file  Stress: Not on file  Social Connections: Not on file  Intimate Partner Violence: Not At Risk (08/18/2022)   Humiliation, Afraid, Rape, and Kick questionnaire    Fear of Current or Ex-Partner: No    Emotionally Abused: No    Physically Abused: No    Sexually Abused: No     RELEVANT GI HISTORY, IMAGING AND LABS: CBC 10/17/2023 [facility] WBC 3.9 4.0-11.0 K/ul    RBC 4.15 4.20-5.80 M/uL    HGB 13.3 13.0-17.0 g/dL    HCT 62.0 60.9-47.9 %    MCV 91.2 80.0-94.0 fL    MCH 32.0 27.0-33.0 pg    MCHC 35.1 32.0-36.0 g/dL    RDW 83.2 88.4-84.4 %    PLT 38 150-400 K/uL Confirmed by smear review  MPV 10.9 7.5-10.7 fL     CMP  Glucose 88 70-99 mg/dL    BUN 11 3-73 mg/dL    Creatinine 9.21 9.39-8.69 mg/dl    [id number] >39 calc Stage 1  > 90 ML/Min plus Albuminuria;Stage 2 60-89 ML/MIN;Stage 3 30-59 ML/MIN;Stage 4 15-29 ML/MIN;Stage 5 <15 ML/MIN  Sodium 131 136-145 mmol/L    Potassium 4.1 3.5-5.5 mmol/L    Chloride 100 98-107 mmol/L    CO2 27 22-32 mmol/L    Anion Gap 8.5 6.0-20.0 mmol/L    Calcium 8.9 8.6-10.3 mg/dL    CA-corrected 0.73 1.39-89.69 mg/dL    Protein, Total 7.1 3.9-1.6 g/dL    Albumin  3.6 3.4-4.8 g/dL     TBIL 2.3 9.6-8.9 mg/dL    ALP 843 61-873 U/L    AST 128 0-39 U/L    ALT 58 0-52 U/L       Component Value Date/Time   NA 132 (L) 08/21/2022 0808   NA 135 10/19/2021 1605   K 4.0 08/21/2022 0808   CL 104 08/21/2022 0808   CO2 24 08/21/2022 0808   GLUCOSE  76 08/21/2022 0808   BUN 12 08/21/2022 0808   BUN 13 10/19/2021 1605   CREATININE 0.75 08/21/2022 0808   CALCIUM 7.7 (L) 08/21/2022 0808   PROT 5.1 (L) 08/21/2022 0808   PROT 7.7 10/19/2021 1605   ALBUMIN  1.9 (L) 08/21/2022 0808   ALBUMIN  2.5 (L) 10/19/2021 1605   AST 126 (H) 08/21/2022 0808   ALT 38 08/21/2022 0808   ALKPHOS 100 08/21/2022 0808   BILITOT 5.9 (H) 08/21/2022 0808   BILITOT 5.5 (H) 10/19/2021 1605   GFRNONAA >60 08/21/2022 0808      Latest Ref Rng & Units 08/21/2022    8:08 AM 08/20/2022    3:49 AM 08/19/2022    4:17 AM  Hepatic Function  Total Protein 6.5 - 8.1 g/dL 5.1  5.7  7.4   Albumin  3.5 - 5.0 g/dL 1.9  2.1  2.6   AST 15 - 41 U/L 126  146  215   ALT 0 - 44 U/L 38  44  61   Alk Phosphatase 38 - 126 U/L 100  85  121   Total Bilirubin 0.3 - 1.2 mg/dL 5.9  6.2  8.4     Review of Systems   All systems reviewed and negative except where noted in HPI.    Physical Exam  BP 121/78   Pulse 73   Temp 98.6 F (37 C)   Ht 5' 11 (1.803 m)   Wt 222 lb 3.2 oz (100.8 kg)   SpO2 98%   BMI 30.99 kg/m  No LMP for male patient. General:   Alert and oriented. Pleasant and cooperative. Well-nourished and well-developed.  Head:  Normocephalic and atraumatic. Eyes:  mild icterus Ears:  Normal auditory acuity. Lungs:   Respirations even and unlabored.  Clear throughout to auscultation.   No wheezes, crackles, or rhonchi. No acute distress. Heart:  Regular rate and rhythm; no murmurs, clicks, rubs, or gallops. Abdomen:  Normal bowel sounds.  No bruits.  Soft, non-tender and non-distended without masses, hepatosplenomegaly or hernias noted.  No guarding or rebound tenderness.  Rectal:  Deferred. Msk:  Symmetrical without gross deformities. Normal posture. Extremities:  Without edema. Neurologic:  Alert and  oriented x4;  grossly normal neurologically. Skin:  Intact without significant lesions or rashes. Psych:  Alert and cooperative. Normal mood and affect.   Assessment & Plan   Andrew Cummings is a 43 y.o. male presenting today with concerns for elevated liver function, low platelets, and surgical clearance.  Alcoholic cirrhosis of liver. 10/17/2023 ALP 156, AST 128, ALT 58 - lengthy discussion of abstaining from alcohol   -Recheck AFP -Will order EGD to screen for esophageal varices.  - Schedule ultrasound RUQ.  - Schedule EGD for screening of esophageal varices. I discussed risks of EGD with patient today, including risk of sedation, bleeding or perforation. Patient provides understanding and gave verbal consent to proceed.  Thrombocytopenia. 10/17/2023 PLT 38 - check CBC, INR, PT today - Patient seeking clearance for surgery.  Had considered Doptelet or Mulpleta but these medications are very expensive and not covered by state or federal government programs.  Will refer patient to hematology for other treatment options or possible infusion prior to surgery.   Hyponatremia: mild at 131. Asymptomatic. -Discussed this is common finding with cirrhosis - Advised to limit excessive water intake.   Follow-up in 4 weeks  I discussed the assessment and treatment plan with the patient. The patient was provided an opportunity to ask questions and all were answered. The patient  agreed with the plan and  demonstrated an understanding of the instructions.   The patient was advised to call back or seek an in-person evaluation if the symptoms worsen or if the condition fails to improve as anticipated.  [name] Bohr, DNP, AGNP-C [facility] Gastroenterology

## 2023-10-25 ENCOUNTER — Encounter: Payer: Self-pay | Admitting: Family Medicine

## 2023-10-25 ENCOUNTER — Other Ambulatory Visit: Payer: Self-pay | Admitting: Family Medicine

## 2023-10-25 ENCOUNTER — Telehealth: Payer: Self-pay

## 2023-10-25 ENCOUNTER — Ambulatory Visit (INDEPENDENT_AMBULATORY_CARE_PROVIDER_SITE_OTHER): Admitting: Family Medicine

## 2023-10-25 VITALS — BP 121/78 | HR 73 | Temp 98.6°F | Ht 71.0 in | Wt 222.2 lb

## 2023-10-25 DIAGNOSIS — I8510 Secondary esophageal varices without bleeding: Secondary | ICD-10-CM | POA: Diagnosis not present

## 2023-10-25 DIAGNOSIS — D696 Thrombocytopenia, unspecified: Secondary | ICD-10-CM

## 2023-10-25 DIAGNOSIS — K7030 Alcoholic cirrhosis of liver without ascites: Secondary | ICD-10-CM | POA: Diagnosis not present

## 2023-10-25 DIAGNOSIS — E871 Hypo-osmolality and hyponatremia: Secondary | ICD-10-CM

## 2023-10-25 NOTE — Addendum Note (Signed)
 Addended by: CELESTIA RIMA on: 10/25/2023 04:37 PM   Modules accepted: Level of Service

## 2023-10-25 NOTE — Patient Instructions (Signed)
 Ultrasound scheduled 10/28/23 Friday @ [facility]. Please arrive at 9:00 am. Do not eat/drink after midnight the night before .  Please go have labs done today.

## 2023-10-25 NOTE — Telephone Encounter (Signed)
 Placed referral to Hematology for Thrombocytopenia per Robin.   Also LVM stating that no medication is being sent and that someone from Hematology will call to schedule a consult appointment.  Also call office once you receive this message.

## 2023-10-26 ENCOUNTER — Ambulatory Visit: Payer: Self-pay | Admitting: Family Medicine

## 2023-10-26 LAB — CBC WITH DIFFERENTIAL/PLATELET
Basophils Absolute: 0.1 x10E3/uL (ref 0.0–0.2)
Basos: 1 %
EOS (ABSOLUTE): 0.1 x10E3/uL (ref 0.0–0.4)
Eos: 2 %
Hematocrit: 43.1 % (ref 37.5–51.0)
Hemoglobin: 14 g/dL (ref 13.0–17.7)
Immature Grans (Abs): 0 x10E3/uL (ref 0.0–0.1)
Immature Granulocytes: 0 %
Lymphocytes Absolute: 3 x10E3/uL (ref 0.7–3.1)
Lymphs: 59 %
MCH: 31.3 pg (ref 26.6–33.0)
MCHC: 32.5 g/dL (ref 31.5–35.7)
MCV: 96 fL (ref 79–97)
Monocytes Absolute: 0.7 x10E3/uL (ref 0.1–0.9)
Monocytes: 15 %
Neutrophils Absolute: 1.1 x10E3/uL — ABNORMAL LOW (ref 1.4–7.0)
Neutrophils: 23 %
Platelets: 107 x10E3/uL — ABNORMAL LOW (ref 150–450)
RBC: 4.48 x10E6/uL (ref 4.14–5.80)
RDW: 15.9 % — ABNORMAL HIGH (ref 11.6–15.4)
WBC: 5 x10E3/uL (ref 3.4–10.8)

## 2023-10-26 LAB — AFP TUMOR MARKER: AFP, Serum, Tumor Marker: 6.5 ng/mL (ref 0.0–6.9)

## 2023-10-26 LAB — PROTIME-INR
INR: 1.4 — ABNORMAL HIGH (ref 0.9–1.2)
Prothrombin Time: 14.5 s — ABNORMAL HIGH (ref 9.1–12.0)

## 2023-10-28 ENCOUNTER — Ambulatory Visit
Admission: RE | Admit: 2023-10-28 | Discharge: 2023-10-28 | Disposition: A | Discharge status: Home / Self Care | Source: Ambulatory Visit | Attending: Family Medicine | Admitting: Family Medicine

## 2023-10-28 DIAGNOSIS — K7030 Alcoholic cirrhosis of liver without ascites: Secondary | ICD-10-CM

## 2023-10-29 ENCOUNTER — Emergency Department
Admission: EM | Admit: 2023-10-29 | Discharge: 2023-10-29 | Disposition: A | Discharge status: Home / Self Care | Attending: Emergency Medicine | Admitting: Emergency Medicine

## 2023-10-29 ENCOUNTER — Emergency Department

## 2023-10-29 ENCOUNTER — Other Ambulatory Visit: Payer: Self-pay

## 2023-10-29 ENCOUNTER — Encounter: Payer: Self-pay | Admitting: Emergency Medicine

## 2023-10-29 DIAGNOSIS — X503XXA Overexertion from repetitive movements, initial encounter: Secondary | ICD-10-CM | POA: Diagnosis not present

## 2023-10-29 DIAGNOSIS — S4992XA Unspecified injury of left shoulder and upper arm, initial encounter: Secondary | ICD-10-CM | POA: Diagnosis present

## 2023-10-29 DIAGNOSIS — S43015A Anterior dislocation of left humerus, initial encounter: Secondary | ICD-10-CM | POA: Diagnosis not present

## 2023-10-29 MED ORDER — MORPHINE SULFATE (PF) 4 MG/ML IV SOLN
4.0000 mg | Freq: Once | INTRAVENOUS | Status: AC
  Administered 2023-10-29: 4 mg via INTRAVENOUS
  Filled 2023-10-29: qty 1

## 2023-10-29 NOTE — ED Provider Notes (Signed)
 [facility] Provider Note    Event Date/Time   First MD Initiated Contact with Patient 10/29/23 1245     (approximate)   History   Shoulder Pain   HPI  Andrew Cummings is a 43 y.o. male with PMH of shoulder dislocations presents for evaluation of left shoulder pain.  Patient states he was moving boxes in his storage unit when he felt his shoulder dislocate.  He is sometimes able to reduce it at home but has not been able to get it back in 2 joints so he came to the emergency department.      Physical Exam   Triage Vital Signs: ED Triage Vitals  Encounter Vitals Group     BP 10/29/23 1231 (!) 137/98     Girls Systolic BP Percentile --      Girls Diastolic BP Percentile --      Boys Systolic BP Percentile --      Boys Diastolic BP Percentile --      Pulse Rate 10/29/23 1231 80     Resp 10/29/23 1231 18     Temp 10/29/23 1231 97.9 F (36.6 C)     Temp Source 10/29/23 1231 Oral     SpO2 10/29/23 1231 98 %     Weight 10/29/23 1230 210 lb (95.3 kg)     Height 10/29/23 1230 5' 11 (1.803 m)     Head Circumference --      Peak Flow --      Pain Score 10/29/23 1230 8     Pain Loc --      Pain Education --      Exclude from Growth Chart --     Most recent vital signs: Vitals:   10/29/23 1231  BP: (!) 137/98  Pulse: 80  Resp: 18  Temp: 97.9 F (36.6 C)  SpO2: 98%   General: Awake, no distress.  CV:  Good peripheral perfusion.  Resp:  Normal effort.  Abd:  No distention.  Other:  Obvious deformity to the left shoulder, very tender to palpation, radial pulse 2+ and regular, patient neurovascularly intact in the hand.   ED Results / Procedures / Treatments   Labs (all labs ordered are listed, but only abnormal results are displayed) Labs Reviewed - No data to display   RADIOLOGY  Left shoulder x-rays obtained, interpreted the images as well as reviewed the radiologist report.  First x-ray shows an anterior shoulder dislocation,  second x-ray shows a successful reduction.  PROCEDURES:  Critical Care performed: No  Procedures   MEDICATIONS ORDERED IN ED: Medications  morphine  (PF) 4 MG/ML injection 4 mg (4 mg Intravenous Given 10/29/23 1328)     IMPRESSION / MDM / ASSESSMENT AND PLAN / ED COURSE  I reviewed the triage vital signs and the nursing notes.                             43 year old male presents for evaluation of left shoulder dislocation.  Blood pressure is elevated and patient is very uncomfortable on initial assessment.  Vital signs stable otherwise.  Differential diagnosis includes, but is not limited to, shoulder dislocation, shoulder fracture, muscle strain, cervical radiculopathy.  Patient's presentation is most consistent with acute complicated illness / injury requiring diagnostic workup.  Left shoulder x-ray shows an anterior shoulder dislocation.  Patient was given IV morphine  and I attempted to reduce shoulder by [facility] method.  Massage patient's shoulder for  about 2 to 3 minutes but patient's pain was so severe he was unable to tolerate me lifting his wrist at all.  I suggested multiple other shoulder reduction methods and patient does not feel he can tolerate any of them.  Will plan to do a conscious sedation for shoulder reduction.  Nursing staff came to notify me that shoulder spontaneously reduced on its own.  Patient's pain is improved.  On repeat x-ray shoulder has in fact reduced.  On reassessment of patient he appears to be much more comfortable, is able to do some gentle range of motion.  Will place patient in shoulder sling and advised him to follow-up with his orthopedic provider.  He voiced understanding, all questions were answered he was stable at discharge.       FINAL CLINICAL IMPRESSION(S) / ED DIAGNOSES   Final diagnoses:  Anterior dislocation of left shoulder, initial encounter     Rx / DC Orders   ED Discharge Orders     None        Note:  This  document was prepared using Dragon voice recognition software and may include unintentional dictation errors.   Cleaster Tinnie LABOR, PA-C 10/29/23 1505    Floy Roberts, MD 10/29/23 1558

## 2023-10-29 NOTE — ED Triage Notes (Signed)
 Pt via POV from home. Pt was moving boxes out of a storage unit approx 20 mins ago. Pt has a hx of frequent dislocations in the L shoulder, pt states he is sometimes able to place it back in place himself but sometimes does have to come to the hospital for a reduction. Pt is A&Ox4 and NAD, ambulatory to triage with steady gait.

## 2023-10-29 NOTE — Discharge Instructions (Signed)
 Please wear the shoulder sling for the next few days. You can take 650 mg of Tylenol  and 600 mg of ibuprofen every 6 hours as needed for pain. You can use ice, heat, muscle creams and other topical pain relievers as well.  Schedule follow-up appointment with your orthopedic provider.  Return to the emergency department with any worsening symptoms.

## 2023-10-29 NOTE — ED Notes (Signed)
 Pt may have popped shoulder back into place. Pt was getting up to move to room 8 and felt something pop. Shoulders looks symmetric now. Provider informed, rpt xray will be done.

## 2023-10-31 ENCOUNTER — Ambulatory Visit
Admission: RE | Admit: 2023-10-31 | Discharge: 2023-10-31 | Disposition: A | Discharge status: Home / Self Care | Source: Ambulatory Visit | Attending: Family Medicine | Admitting: Family Medicine

## 2023-10-31 DIAGNOSIS — K7030 Alcoholic cirrhosis of liver without ascites: Secondary | ICD-10-CM | POA: Insufficient documentation

## 2023-11-01 ENCOUNTER — Ambulatory Visit: Admission: RE | Admit: 2023-11-01 | Source: Home / Self Care | Admitting: Gastroenterology

## 2023-11-01 ENCOUNTER — Telehealth: Payer: Self-pay

## 2023-11-01 ENCOUNTER — Encounter: Admission: RE | Payer: Self-pay | Source: Home / Self Care

## 2023-11-01 SURGERY — EGD (ESOPHAGOGASTRODUODENOSCOPY)
Anesthesia: General

## 2023-11-01 NOTE — Telephone Encounter (Signed)
 Received a phone call from Trish in Endo.  She stated that patient did not show up for his EGD today.  He was made aware of his time yesterday, and when contacted this am to see if he was coming he said he was not coming.   Message routed to NP-Robin and her CMA Shelia to make them both aware.  Thank you,  Rosaline, CMA

## 2023-11-04 NOTE — Telephone Encounter (Signed)
 Noted, I called him myself to discuss and left him a VM to call me back.

## 2023-11-04 NOTE — Progress Notes (Signed)
 Ultrasound consistent with cirrhosis with reversal of blood flow in the portal vein.   EGD was planned for patient but he did not show up for procedure, Dorothe CMA spoke with him and said he did not think it was needed at this time.  With history of large varices in 2023,  I have called patient and left message to discuss this with him, awaiting return call.

## 2023-11-08 ENCOUNTER — Inpatient Hospital Stay

## 2023-11-08 ENCOUNTER — Inpatient Hospital Stay: Attending: Oncology | Admitting: Oncology

## 2023-11-14 ENCOUNTER — Telehealth: Payer: Self-pay

## 2023-11-14 NOTE — Telephone Encounter (Signed)
 Pt called wanting to reschedule his EGD that was scheduled for 11/01/23. Pt no showed for that procedure. I advised him the next available appt for a procedure would not be until late December or when Dr. Melany arrives. He has a follow up appt with you on 116/25. Please advise pt if he should keep this follow up appt and you can schedule his EGD from there or if he will need to do the EGD, then schedule his office follow up.

## 2023-11-15 ENCOUNTER — Telehealth: Payer: Self-pay

## 2023-11-15 NOTE — Telephone Encounter (Signed)
 Spoke with this patient  to reschedule his EGD and he has decided to wait until his appointment with Robin 11/24/23 to discuss this further.

## 2023-11-22 ENCOUNTER — Encounter: Payer: Self-pay | Admitting: Oncology

## 2023-11-22 ENCOUNTER — Inpatient Hospital Stay: Attending: Oncology | Admitting: Oncology

## 2023-11-22 ENCOUNTER — Inpatient Hospital Stay

## 2023-11-23 DIAGNOSIS — F329 Major depressive disorder, single episode, unspecified: Secondary | ICD-10-CM | POA: Insufficient documentation

## 2023-11-23 NOTE — Progress Notes (Deleted)
 11/23/2023 Keshon Markovitz [id number] Aug 24, 1980  Gastroenterology Office Note    Referring Provider: Kip Righter, MD Primary Care Physician:  Kip Righter, MD  Primary GI Provider: Jinny Carmine, MD    Chief Complaint   No chief complaint on file.    History of Present Illness   Montana Nanney is a 43 y.o. male with PMHX of *** , presenting today   Patient last seen by myself on 10/25/2023   Past Medical History:  Diagnosis Date   Alcohol  abuse    Allergic rhinitis 10/21/2023   Current drinker of alcohol  10/21/2023   Depressed mood 10/21/2023   Generalized anxiety disorder 10/21/2023   History of closed shoulder dislocation    bilateral   Insomnia 10/21/2023   Major depressive disorder, single episode, unspecified 11/23/2023   Steatosis of liver 10/21/2023    Past Surgical History:  Procedure Laterality Date   ESOPHAGOGASTRODUODENOSCOPY (EGD) WITH PROPOFOL  N/A 10/30/2021   Procedure: ESOPHAGOGASTRODUODENOSCOPY (EGD) WITH PROPOFOL ;  Surgeon: Therisa Bi, MD;  Location: [facility] ENDOSCOPY;  Service: Gastroenterology;  Laterality: N/A;    Current Outpatient Medications  Medication Sig Dispense Refill   doxepin (SINEQUAN) 10 MG capsule 1 capsule at bedtime as needed Orally Once a day; Duration: 30 days     folic acid  (FOLVITE ) 1 MG tablet Take 1 tablet (1 mg total) by mouth daily. 30 tablet 0   lactulose  (CHRONULAC ) 10 GM/15ML solution Take 45 mLs (30 g total) by mouth 2 (two) times daily as needed for mild constipation or moderate constipation. 2730 mL 0   traZODone  (DESYREL ) 50 MG tablet Take 0.5-1 tablets (25-50 mg total) by mouth at bedtime as needed. 30 tablet 0   No current facility-administered medications for this visit.    Allergies as of 11/24/2023   (No Known Allergies)    Family History  Problem Relation Age of Onset   Diabetes Father     Social History   Socioeconomic History   Marital status: Single    Spouse name: Not on file    Number of children: Not on file   Years of education: Not on file   Highest education level: Not on file  Occupational History   Not on file  Tobacco Use   Smoking status: Former    Types: Cigarettes   Smokeless tobacco: Never  Vaping Use   Vaping status: Never Used  Substance and Sexual Activity   Alcohol  use: Yes    Comment: occasionally drinks   Drug use: Not Currently   Sexual activity: Not on file  Other Topics Concern   Not on file  Social History Narrative   Not on file   Social Drivers of Health   Financial Resource Strain: Not on file  Food Insecurity: No Food Insecurity (08/18/2022)   Hunger Vital Sign    Worried About Running Out of Food in the Last Year: Never true    Ran Out of Food in the Last Year: Never true  Transportation Needs: No Transportation Needs (08/18/2022)   PRAPARE - Administrator, Civil Service (Medical): No    Lack of Transportation (Non-Medical): No  Physical Activity: Not on file  Stress: Not on file  Social Connections: Not on file  Intimate Partner Violence: Not At Risk (08/18/2022)   Humiliation, Afraid, Rape, and Kick questionnaire    Fear of Current or Ex-Partner: No    Emotionally Abused: No    Physically Abused: No    Sexually Abused: No  RELEVANT GI HISTORY, IMAGING AND LABS: CBC    Component Value Date/Time   WBC 5.0 10/25/2023 1618   WBC 4.4 08/21/2022 0808   RBC 4.48 10/25/2023 1618   RBC 2.30 (L) 08/21/2022 0808   HGB 14.0 10/25/2023 1618   HCT 43.1 10/25/2023 1618   PLT 107 (L) 10/25/2023 1618   MCV 96 10/25/2023 1618   MCH 31.3 10/25/2023 1618   MCH 33.0 08/21/2022 0808   MCHC 32.5 10/25/2023 1618   MCHC 36.2 (H) 08/21/2022 0808   RDW 15.9 (H) 10/25/2023 1618   LYMPHSABS 3.0 10/25/2023 1618   MONOABS 0.7 08/21/2022 0808   EOSABS 0.1 10/25/2023 1618   BASOSABS 0.1 10/25/2023 1618   Recent Labs    10/25/23 1618  HGB 14.0    CMP     Component Value Date/Time   NA 132 (L) 08/21/2022 0808    NA 135 10/19/2021 1605   K 4.0 08/21/2022 0808   CL 104 08/21/2022 0808   CO2 24 08/21/2022 0808   GLUCOSE 76 08/21/2022 0808   BUN 12 08/21/2022 0808   BUN 13 10/19/2021 1605   CREATININE 0.75 08/21/2022 0808   CALCIUM 7.7 (L) 08/21/2022 0808   PROT 5.1 (L) 08/21/2022 0808   PROT 7.7 10/19/2021 1605   ALBUMIN  1.9 (L) 08/21/2022 0808   ALBUMIN  2.5 (L) 10/19/2021 1605   AST 126 (H) 08/21/2022 0808   ALT 38 08/21/2022 0808   ALKPHOS 100 08/21/2022 0808   BILITOT 5.9 (H) 08/21/2022 0808   BILITOT 5.5 (H) 10/19/2021 1605   GFRNONAA >60 08/21/2022 0808      Latest Ref Rng & Units 08/21/2022    8:08 AM 08/20/2022    3:49 AM 08/19/2022    4:17 AM  Hepatic Function  Total Protein 6.5 - 8.1 g/dL 5.1  5.7  7.4   Albumin  3.5 - 5.0 g/dL 1.9  2.1  2.6   AST 15 - 41 U/L 126  146  215   ALT 0 - 44 U/L 38  44  61   Alk Phosphatase 38 - 126 U/L 100  85  121   Total Bilirubin 0.3 - 1.2 mg/dL 5.9  6.2  8.4       Review of Systems   All systems reviewed and negative except where noted in HPI.    Physical Exam  There were no vitals taken for this visit. No LMP for male patient. General:   Alert and oriented. Pleasant and cooperative. Well-nourished and well-developed.  Head:  Normocephalic and atraumatic. Eyes:  Without icterus Ears:  Normal auditory acuity. Neck:  Supple; no masses or thyromegaly. Lungs:  Respirations even and unlabored.  Clear throughout to auscultation.   No wheezes, crackles, or rhonchi. No acute distress. Heart:  Regular rate and rhythm; no murmurs, clicks, rubs, or gallops. Abdomen:  Normal bowel sounds.  No bruits.  Soft, non-tender and non-distended without masses, hepatosplenomegaly or hernias noted.  No guarding or rebound tenderness.  ***Negative Carnett sign.   Rectal:  Deferred.***  Msk:  Symmetrical without gross deformities. Normal posture. Extremities:  Without edema. Neurologic:  Alert and  oriented x4;  grossly normal neurologically. Skin:  Intact  without significant lesions or rashes. Psych:  Alert and cooperative. Normal mood and affect.   Assessment & Plan   Winter Wilford is a 43 y.o. male presenting today with    I discussed the assessment and treatment plan with the patient. The patient was provided an opportunity to ask questions and all  were answered. The patient agreed with the plan and demonstrated an understanding of the instructions.   The patient was advised to call back or seek an in-person evaluation if the symptoms worsen or if the condition fails to improve as anticipated.  [name] Bohr, DNP, AGNP-C [facility] Gastroenterology

## 2023-11-24 ENCOUNTER — Telehealth: Payer: Self-pay

## 2023-11-24 ENCOUNTER — Ambulatory Visit: Admitting: Family Medicine

## 2023-11-24 ENCOUNTER — Other Ambulatory Visit: Payer: Self-pay

## 2023-11-24 DIAGNOSIS — I8510 Secondary esophageal varices without bleeding: Secondary | ICD-10-CM

## 2023-11-24 DIAGNOSIS — D696 Thrombocytopenia, unspecified: Secondary | ICD-10-CM | POA: Diagnosis not present

## 2023-11-24 DIAGNOSIS — R945 Abnormal results of liver function studies: Secondary | ICD-10-CM | POA: Diagnosis not present

## 2023-11-24 NOTE — Telephone Encounter (Signed)
 Spoke with patient today -he wanted to see if todays appointment was necessary due to not having EGD  done-  Egd scheduled 02/07/24  and follow up appointment scheduled after EGD- all instructions were mailed as well as appointment reminder.

## 2023-12-08 ENCOUNTER — Telehealth: Payer: Self-pay | Admitting: Oncology

## 2023-12-08 NOTE — Telephone Encounter (Signed)
 Pt calling and states he was recommended by his GI doctor to come and see Dr. Babara due to platelets. Please advise on scheduling. . [phone]  the pt states he is due for surgery before the end of the month.

## 2023-12-09 ENCOUNTER — Other Ambulatory Visit: Payer: Self-pay | Admitting: Family Medicine

## 2023-12-09 ENCOUNTER — Telehealth: Payer: Self-pay

## 2023-12-09 DIAGNOSIS — K7030 Alcoholic cirrhosis of liver without ascites: Secondary | ICD-10-CM

## 2023-12-09 NOTE — Progress Notes (Signed)
 Patient left message that he was retaining fluid. I have tried to call and unable to leave a voicemail. Will notify him via mychart that I have ordered labs and he will need to get those done prior to starting on diuretic.

## 2023-12-09 NOTE — Telephone Encounter (Signed)
 Patient Andrew Cummings -he is asking to speak with [name]- He stated [name] mentioned a medication that she could prescribe to increase platelets and also thinks he is retaining fluid and possibly needing Furosamide. Please call patient when you have time.

## 2023-12-13 NOTE — Telephone Encounter (Signed)
 Tried calling patient-unable to leave message- needs to have labs done and then [name] will prescribe diuretic.,

## 2023-12-26 ENCOUNTER — Encounter: Payer: Self-pay | Admitting: Oncology

## 2023-12-26 ENCOUNTER — Inpatient Hospital Stay

## 2023-12-26 ENCOUNTER — Ambulatory Visit: Payer: Self-pay | Admitting: Oncology

## 2023-12-26 ENCOUNTER — Inpatient Hospital Stay: Attending: Oncology | Admitting: Oncology

## 2023-12-26 VITALS — BP 129/79 | HR 65 | Temp 96.8°F | Resp 18 | Wt 239.9 lb

## 2023-12-26 DIAGNOSIS — D696 Thrombocytopenia, unspecified: Secondary | ICD-10-CM

## 2023-12-26 DIAGNOSIS — D709 Neutropenia, unspecified: Secondary | ICD-10-CM | POA: Insufficient documentation

## 2023-12-26 DIAGNOSIS — F1090 Alcohol use, unspecified, uncomplicated: Secondary | ICD-10-CM | POA: Diagnosis not present

## 2023-12-26 DIAGNOSIS — K7030 Alcoholic cirrhosis of liver without ascites: Secondary | ICD-10-CM | POA: Insufficient documentation

## 2023-12-26 LAB — CBC WITH DIFFERENTIAL/PLATELET
Abs Immature Granulocytes: 0 K/uL (ref 0.00–0.07)
Basophils Absolute: 0 K/uL (ref 0.0–0.1)
Basophils Relative: 1 %
Eosinophils Absolute: 0.1 K/uL (ref 0.0–0.5)
Eosinophils Relative: 3 %
HCT: 35.9 % — ABNORMAL LOW (ref 39.0–52.0)
Hemoglobin: 12.6 g/dL — ABNORMAL LOW (ref 13.0–17.0)
Immature Granulocytes: 0 %
Lymphocytes Relative: 58 %
Lymphs Abs: 2.3 K/uL (ref 0.7–4.0)
MCH: 31.8 pg (ref 26.0–34.0)
MCHC: 35.1 g/dL (ref 30.0–36.0)
MCV: 90.7 fL (ref 80.0–100.0)
Monocytes Absolute: 0.6 K/uL (ref 0.1–1.0)
Monocytes Relative: 14 %
Neutro Abs: 1 K/uL — ABNORMAL LOW (ref 1.7–7.7)
Neutrophils Relative %: 24 %
Platelets: 86 K/uL — ABNORMAL LOW (ref 150–400)
RBC: 3.96 MIL/uL — ABNORMAL LOW (ref 4.22–5.81)
RDW: 15.7 % — ABNORMAL HIGH (ref 11.5–15.5)
WBC: 4 K/uL (ref 4.0–10.5)
nRBC: 0 % (ref 0.0–0.2)

## 2023-12-26 LAB — COMPREHENSIVE METABOLIC PANEL WITH GFR
ALT: 48 U/L — ABNORMAL HIGH (ref 0–44)
AST: 87 U/L — ABNORMAL HIGH (ref 15–41)
Albumin: 3.3 g/dL — ABNORMAL LOW (ref 3.5–5.0)
Alkaline Phosphatase: 130 U/L — ABNORMAL HIGH (ref 38–126)
Anion gap: 9 (ref 5–15)
BUN: 9 mg/dL (ref 6–20)
CO2: 23 mmol/L (ref 22–32)
Calcium: 9.1 mg/dL (ref 8.9–10.3)
Chloride: 105 mmol/L (ref 98–111)
Creatinine, Ser: 0.78 mg/dL (ref 0.61–1.24)
GFR, Estimated: >60 mL/min (ref >60)
Glucose, Bld: 90 mg/dL (ref 70–99)
Potassium: 4.4 mmol/L (ref 3.5–5.1)
Sodium: 136 mmol/L (ref 135–145)
Total Bilirubin: 1.4 mg/dL — ABNORMAL HIGH (ref 0.0–1.2)
Total Protein: 6.2 g/dL — ABNORMAL LOW (ref 6.5–8.1)

## 2023-12-26 LAB — IRON AND TIBC
Iron: 104 ug/dL (ref 45–182)
Saturation Ratios: 32 % (ref 17.9–39.5)
TIBC: 329 ug/dL (ref 250–450)
UIBC: 225 ug/dL

## 2023-12-26 LAB — VITAMIN B12: Vitamin B-12: 609 pg/mL (ref 180–914)

## 2023-12-26 LAB — TECHNOLOGIST SMEAR REVIEW: Plt Morphology: DECREASED

## 2023-12-26 LAB — FOLATE: Folate: 12 ng/mL (ref >5.9)

## 2023-12-26 LAB — IMMATURE PLATELET FRACTION: Immature Platelet Fraction: 13.8 % — ABNORMAL HIGH (ref 1.2–8.6)

## 2023-12-26 LAB — LACTATE DEHYDROGENASE: LDH: 237 U/L — ABNORMAL HIGH (ref 105–235)

## 2023-12-26 LAB — FERRITIN: Ferritin: 69 ng/mL (ref 24–336)

## 2023-12-26 NOTE — Assessment & Plan Note (Signed)
 Recommended follow-up with GI every six months for monitoring and liver ultrasound.

## 2023-12-26 NOTE — Assessment & Plan Note (Signed)
 Possibly secondary to liver cirrhosis, alcohol  use.  Pending above workup

## 2023-12-26 NOTE — Assessment & Plan Note (Addendum)
 Chronic thrombocytopenia likely due to alcoholic cirrhosis.  Previous CT showed normal spleen size. Platelet count acceptable for surgery. Check thrombocytopenia workup Obtain left upper quadrant ultrasound spleen for evaluation.

## 2023-12-26 NOTE — Progress Notes (Signed)
 Hematology/Oncology Consult note [phone] [phone]        REFERRING PROVIDER: Kip Righter, MD   CHIEF COMPLAINTS/REASON FOR VISIT:  Evaluation of thrombocytopenia   ASSESSMENT & PLAN:   Thrombocytopenia Chronic thrombocytopenia likely due to alcoholic cirrhosis.  Previous CT showed normal spleen size. Platelet count acceptable for surgery. Check thrombocytopenia workup Obtain left upper quadrant ultrasound spleen for evaluation.   Alcohol  use disorder Advised complete cessation of alcohol .  Liver cirrhosis (HCC) Recommended follow-up with GI every six months for monitoring and liver ultrasound.  Neutropenia Possibly secondary to liver cirrhosis, alcohol  use.  Pending above workup   Orders Placed This Encounter  Procedures   Folate    Standing Status:   Future    Number of Occurrences:   1    Expected Date:   12/26/2023    Expiration Date:   03/25/2024   Vitamin B12    Standing Status:   Future    Number of Occurrences:   1    Expected Date:   12/26/2023    Expiration Date:   03/25/2024   CBC with Differential/Platelet    Standing Status:   Future    Number of Occurrences:   1    Expected Date:   12/26/2023    Expiration Date:   03/25/2024   Immature Platelet Fraction    Standing Status:   Future    Number of Occurrences:   1    Expected Date:   12/26/2023    Expiration Date:   03/25/2024   Multiple Myeloma Panel (SPEP&IFE w/QIG)    Standing Status:   Future    Number of Occurrences:   1    Expected Date:   12/26/2023    Expiration Date:   03/25/2024   Kappa/lambda light chains    Standing Status:   Future    Number of Occurrences:   1    Expected Date:   12/26/2023    Expiration Date:   03/25/2024   Flow cytometry panel-leukemia/lymphoma work-up    Standing Status:   Future    Number of Occurrences:   1    Expected Date:   12/26/2023    Expiration Date:   03/25/2024   Lactate dehydrogenase    Standing Status:   Future    Number of  Occurrences:   1    Expected Date:   12/26/2023    Expiration Date:   03/25/2024   Ferritin    Standing Status:   Future    Number of Occurrences:   1    Expected Date:   12/26/2023    Expiration Date:   03/25/2024   Iron and TIBC    Standing Status:   Future    Number of Occurrences:   1    Expected Date:   12/26/2023    Expiration Date:   03/25/2024   Comprehensive metabolic panel with GFR    Standing Status:   Future    Number of Occurrences:   1    Expected Date:   12/26/2023    Expiration Date:   03/25/2024   Technologist smear review    Standing Status:   Future    Number of Occurrences:   1    Expected Date:   12/26/2023    Expiration Date:   12/25/2024    Clinical information::   thormbocytopenia   Follow-up in few weeks to go over results. All questions were answered. The patient knows to call the clinic with  any problems, questions or concerns.  Zelphia Cap, MD, PhD [facility] Hematology Oncology 12/26/2023   HISTORY OF PRESENTING ILLNESS:   Andrew Cummings is a  43 y.o.  male with PMH listed below was seen in consultation at the request of  Kip Righter, MD  for evaluation of thrombocytopenia  Discussed the use of AI scribe software for clinical note transcription with the patient, who gave verbal consent to proceed.   He has a history of cirrhosis attributed to alcohol  use and has significantly reduced his alcohol  consumption, now only drinking champagne or wine once or twice a month at social events, compared to daily consumption during the pandemic. His tolerance was high due to previous heavy drinking, which sometimes leads to consuming more than intended at social gatherings.  He was diagnosed with cirrhosis and is concerned about the impact of dietary factors on his liver health he previously drinks tonic water contains quinine.   He has a history of ascites which now has resolved.  He reports feeling better physically.  He is preparing for surgery on his left shoulder  due to dislocation and is concerned about his platelet levels affecting the procedure. His last known platelet count was 107. No easy bruising is reported.   MEDICAL HISTORY:  Past Medical History:  Diagnosis Date   Alcohol  abuse    Allergic rhinitis 10/21/2023   Current drinker of alcohol  10/21/2023   Depressed mood 10/21/2023   Generalized anxiety disorder 10/21/2023   History of closed shoulder dislocation    bilateral   Insomnia 10/21/2023   Major depressive disorder, single episode, unspecified 11/23/2023   Steatosis of liver 10/21/2023    SURGICAL HISTORY: Past Surgical History:  Procedure Laterality Date   ESOPHAGOGASTRODUODENOSCOPY (EGD) WITH PROPOFOL  N/A 10/30/2021   Procedure: ESOPHAGOGASTRODUODENOSCOPY (EGD) WITH PROPOFOL ;  Surgeon: Therisa Bi, MD;  Location: [facility] ENDOSCOPY;  Service: Gastroenterology;  Laterality: N/A;    SOCIAL HISTORY: Social History   Socioeconomic History   Marital status: Single    Spouse name: Not on file   Number of children: Not on file   Years of education: Not on file   Highest education level: Not on file  Occupational History   Not on file  Tobacco Use   Smoking status: Never   Smokeless tobacco: Never  Vaping Use   Vaping status: Never Used  Substance and Sexual Activity   Alcohol  use: Yes    Comment: occasionally drinks   Drug use: Not Currently   Sexual activity: Not on file  Other Topics Concern   Not on file  Social History Narrative   Not on file   Social Drivers of Health   Financial Resource Strain: Not on file  Food Insecurity: No Food Insecurity (08/18/2022)   Hunger Vital Sign    Worried About Running Out of Food in the Last Year: Never true    Ran Out of Food in the Last Year: Never true  Transportation Needs: No Transportation Needs (08/18/2022)   PRAPARE - Administrator, Civil Service (Medical): No    Lack of Transportation (Non-Medical): No  Physical Activity: Not on file  Stress: Not on  file  Social Connections: Not on file  Intimate Partner Violence: Not At Risk (08/18/2022)   Humiliation, Afraid, Rape, and Kick questionnaire    Fear of Current or Ex-Partner: No    Emotionally Abused: No    Physically Abused: No    Sexually Abused: No    FAMILY HISTORY: Family History  Problem Relation Age of Onset   Diabetes Father     ALLERGIES:  has no known allergies.  MEDICATIONS:  Current Outpatient Medications  Medication Sig Dispense Refill   doxepin (SINEQUAN) 10 MG capsule 1 capsule at bedtime as needed Orally Once a day; Duration: 30 days     folic acid  (FOLVITE ) 1 MG tablet Take 1 tablet (1 mg total) by mouth daily. 30 tablet 0   furosemide  (LASIX ) 20 MG tablet Take 20 mg by mouth daily as needed.     lactulose  (CHRONULAC ) 10 GM/15ML solution Take 45 mLs (30 g total) by mouth 2 (two) times daily as needed for mild constipation or moderate constipation. 2730 mL 0   traZODone  (DESYREL ) 50 MG tablet Take 0.5-1 tablets (25-50 mg total) by mouth at bedtime as needed. 30 tablet 0   No current facility-administered medications for this visit.    Review of Systems  Constitutional:  Negative for appetite change, chills, fatigue and fever.  HENT:   Negative for hearing loss and voice change.   Eyes:  Negative for eye problems.  Respiratory:  Negative for chest tightness and cough.   Cardiovascular:  Negative for chest pain.  Gastrointestinal:  Negative for abdominal distention, abdominal pain and blood in stool.  Endocrine: Negative for hot flashes.  Genitourinary:  Negative for difficulty urinating and frequency.   Musculoskeletal:  Negative for arthralgias.  Skin:  Negative for itching and rash.  Neurological:  Negative for extremity weakness.  Hematological:  Negative for adenopathy. Does not bruise/bleed easily.  Psychiatric/Behavioral:  Negative for confusion.    PHYSICAL EXAMINATION:  Vitals:   12/26/23 1030  BP: 129/79  Pulse: 65  Resp: 18  Temp: (!) 96.8  F (36 C)  SpO2: 100%   Filed Weights   12/26/23 1030  Weight: 239 lb 14.4 oz (108.8 kg)    Physical Exam Constitutional:      General: He is not in acute distress. HENT:     Head: Normocephalic and atraumatic.  Eyes:     General: No scleral icterus. Cardiovascular:     Rate and Rhythm: Normal rate and regular rhythm.  Pulmonary:     Effort: Pulmonary effort is normal. No respiratory distress.     Breath sounds: Normal breath sounds. No wheezing.  Abdominal:     General: Bowel sounds are normal. There is no distension.     Palpations: Abdomen is soft.  Musculoskeletal:        General: No deformity. Normal range of motion.     Cervical back: Normal range of motion and neck supple.  Skin:    General: Skin is warm and dry.     Findings: No erythema or rash.  Neurological:     Mental Status: He is alert and oriented to person, place, and time. Mental status is at baseline.  Psychiatric:        Mood and Affect: Mood normal.     LABORATORY DATA:  I have reviewed the data as listed    Latest Ref Rng & Units 12/26/2023   11:21 AM 10/25/2023    4:18 PM 08/21/2022    8:08 AM  CBC  WBC 4.0 - 10.5 K/uL 4.0  5.0  4.4   Hemoglobin 13.0 - 17.0 g/dL 87.3  85.9  7.6   Hematocrit 39.0 - 52.0 % 35.9  43.1  21.0   Platelets 150 - 400 K/uL 86  107  80       Latest Ref Rng & Units 12/26/2023  11:21 AM 08/21/2022    8:08 AM 08/20/2022    3:49 AM  CMP  Glucose 70 - 99 mg/dL 90  76  78   BUN 6 - 20 mg/dL 9  12  15    Creatinine 0.61 - 1.24 mg/dL 9.21  9.24  9.26   Sodium 135 - 145 mmol/L 136  132  135   Potassium 3.5 - 5.1 mmol/L 4.4  4.0  4.3   Chloride 98 - 111 mmol/L 105  104  105   CO2 22 - 32 mmol/L 23  24  24    Calcium 8.9 - 10.3 mg/dL 9.1  7.7  7.9   Total Protein 6.5 - 8.1 g/dL 6.2  5.1  5.7   Total Bilirubin 0.0 - 1.2 mg/dL 1.4  5.9  6.2   Alkaline Phos 38 - 126 U/L 130  100  85   AST 15 - 41 U/L 87  126  146   ALT 0 - 44 U/L 48  38  44       RADIOGRAPHIC STUDIES: I  have personally reviewed the radiological images as listed and agreed with the findings in the report. US  Abdomen Limited RUQ (LIVER/GB) Result Date: 11/01/2023 CLINICAL DATA:  Liver cirrhosis. EXAM: ULTRASOUND ABDOMEN LIMITED RIGHT UPPER QUADRANT COMPARISON:  October 01, 2021, MRI of abdomen July 01, 2020. FINDINGS: Gallbladder: 1.3 cm gallstone. The gallbladder wall measures 3.9 mm. No sonographic Murphy sign noted by sonographer. Common bile duct: Diameter: 1.5 mm. Liver: Two small liver cysts are identified, larger measures 1.2 x 1 x 1 cm in the right lobe. Coarsened echotexture with nodular contour. Vein is patent on color Doppler imaging with reversal direction of blood flow away from the liver. Other: None. IMPRESSION: 1. Cholelithiasis without sonographic evidence of acute cholecystitis. 2. Cirrhosis of the liver. 3. Reversal of blood flow in the portal vein. Electronically Signed   By: Craig Farr M.D.   On: 11/01/2023 09:35   DG Shoulder Left Result Date: 10/29/2023 CLINICAL DATA:  Shoulder reduction. EXAM: LEFT SHOULDER - 2+ VIEW COMPARISON:  10/29/2023. FINDINGS: The humeral head appears normally seated in the glenoid fossa. No acute fracture is seen. The soft tissues are within normal limits. IMPRESSION: Successful reduction of the left shoulder. Electronically Signed   By: Leita Birmingham M.D.   On: 10/29/2023 14:41   DG Shoulder Left Result Date: 10/29/2023 CLINICAL DATA:  Left shoulder pain. EXAM: LEFT SHOULDER - 2+ VIEW COMPARISON:  August 17, 2022 FINDINGS: There is anterior dislocation of the left humeral head with respect to the left glenoid. No acute fracture is identified. There is no evidence of arthropathy or other focal bone abnormality. Soft tissues are unremarkable. IMPRESSION: Anterior left shoulder dislocation. Electronically Signed   By: Suzen Dials M.D.   On: 10/29/2023 13:24

## 2023-12-26 NOTE — Assessment & Plan Note (Signed)
 Advised complete cessation of alcohol .

## 2023-12-27 LAB — KAPPA/LAMBDA LIGHT CHAINS
Kappa free light chain: 38.6 mg/L — ABNORMAL HIGH (ref 3.3–19.4)
Kappa, lambda light chain ratio: 1.09 (ref 0.26–1.65)
Lambda free light chains: 35.5 mg/L — ABNORMAL HIGH (ref 5.7–26.3)

## 2023-12-27 NOTE — Telephone Encounter (Signed)
 Please schedule and notify pt of appts:   US  left upper quad  MD 3- 4 weeks

## 2023-12-27 NOTE — Telephone Encounter (Signed)
-----   Message from Zelphia Cap sent at 12/26/2023  8:24 PM EST ----- Please arrange patient to get a left upper quadrant ultrasound-for workup of thrombocytopenia.  Thank you. ----- Message ----- From: Rebecka, [facility] [place] Sent: 12/26/2023  11:52 AM EST To: Zelphia Cap, MD

## 2023-12-28 LAB — MULTIPLE MYELOMA PANEL, SERUM
Albumin SerPl Elph-Mcnc: 2.9 g/dL (ref 2.9–4.4)
Albumin/Glob SerPl: 1 (ref 0.7–1.7)
Alpha 1: 0.2 g/dL (ref 0.0–0.4)
Alpha2 Glob SerPl Elph-Mcnc: 0.5 g/dL (ref 0.4–1.0)
B-Globulin SerPl Elph-Mcnc: 0.9 g/dL (ref 0.7–1.3)
Gamma Glob SerPl Elph-Mcnc: 1.5 g/dL (ref 0.4–1.8)
Globulin, Total: 3.1 g/dL (ref 2.2–3.9)
IgA: 379 mg/dL (ref 90–386)
IgG (Immunoglobin G), Serum: 1520 mg/dL (ref 603–1613)
IgM (Immunoglobulin M), Srm: 104 mg/dL (ref 20–172)
Total Protein ELP: 6 g/dL (ref 6.0–8.5)

## 2023-12-28 LAB — COMP PANEL: LEUKEMIA/LYMPHOMA

## 2024-01-02 NOTE — Progress Notes (Signed)
 Pt has RUQ US  and Dr. Babara is wanting LUQ US  for further eval. Pt notified of this and will contact US  dept to set up appt.

## 2024-01-18 ENCOUNTER — Ambulatory Visit: Admission: RE | Admit: 2024-01-18 | Source: Ambulatory Visit

## 2024-01-20 ENCOUNTER — Ambulatory Visit

## 2024-01-23 ENCOUNTER — Ambulatory Visit

## 2024-01-24 ENCOUNTER — Inpatient Hospital Stay: Attending: Oncology | Admitting: Oncology

## 2024-01-24 ENCOUNTER — Encounter: Payer: Self-pay | Admitting: Oncology

## 2024-01-24 NOTE — Assessment & Plan Note (Deleted)
 Chronic thrombocytopenia likely due to alcoholic cirrhosis.  Previous CT showed normal spleen size. Platelet count acceptable for surgery. Check thrombocytopenia workup Obtain left upper quadrant ultrasound spleen for evaluation.

## 2024-02-07 ENCOUNTER — Ambulatory Visit: Admission: RE | Admit: 2024-02-07 | Source: Home / Self Care | Admitting: Gastroenterology

## 2024-02-07 ENCOUNTER — Encounter: Admission: RE | Payer: Self-pay | Source: Home / Self Care

## 2024-02-07 SURGERY — EGD (ESOPHAGOGASTRODUODENOSCOPY)
Anesthesia: General

## 2024-02-10 DIAGNOSIS — F1011 Alcohol abuse, in remission: Secondary | ICD-10-CM | POA: Insufficient documentation

## 2024-02-13 NOTE — Progress Notes (Unsigned)
 "   02/13/2024 Andrew Cummings [id number] Nov 02, 1980  Gastroenterology Office Note     Primary Care Physician:  Kip Righter, MD  Primary GI Provider: Celestia Rima, NP; Jinny Carmine, MD    Chief Complaint   No chief complaint on file.    History of Present Illness   Andrew Cummings is a 44 y.o. male with PMHX of alcohol  dependence, alcoholic liver cirrhosis, bilateral shoulder displacement presenting today    10/31/2023 RUQ ultrasound 1. Cholelithiasis without sonographic evidence of acute cholecystitis. 2. Cirrhosis of the liver. 3. Reversal of blood flow in the portal vein.  10/25/2023 AFP tumor marker within normal range. Platelets 107.  Patient referred to hematology  Patient last seen 10/25/2023 for alcoholic cirrhosis of liver.  10/17/2023 ALP 156, AST 128, ALT 58  Scheduled EGD but patient canceled, stated he did not feel it was necessary at the time.   Past Medical History:  Diagnosis Date   Alcohol  abuse    Allergic rhinitis 10/21/2023   Current drinker of alcohol  10/21/2023   Depressed mood 10/21/2023   Generalized anxiety disorder 10/21/2023   History of alcohol  abuse 02/10/2024   History of closed shoulder dislocation    bilateral   Insomnia 10/21/2023   Major depressive disorder, single episode, unspecified 11/23/2023   Steatosis of liver 10/21/2023    Past Surgical History:  Procedure Laterality Date   ESOPHAGOGASTRODUODENOSCOPY (EGD) WITH PROPOFOL  N/A 10/30/2021   Procedure: ESOPHAGOGASTRODUODENOSCOPY (EGD) WITH PROPOFOL ;  Surgeon: Therisa Bi, MD;  Location: [facility] ENDOSCOPY;  Service: Gastroenterology;  Laterality: N/A;    Current Outpatient Medications  Medication Sig Dispense Refill   doxepin (SINEQUAN) 10 MG capsule 1 capsule at bedtime as needed Orally Once a day; Duration: 30 days     folic acid  (FOLVITE ) 1 MG tablet Take 1 tablet (1 mg total) by mouth daily. 30 tablet 0   furosemide  (LASIX ) 20 MG tablet Take 20 mg by mouth  daily as needed.     lactulose  (CHRONULAC ) 10 GM/15ML solution Take 45 mLs (30 g total) by mouth 2 (two) times daily as needed for mild constipation or moderate constipation. 2730 mL 0   traZODone  (DESYREL ) 50 MG tablet Take 0.5-1 tablets (25-50 mg total) by mouth at bedtime as needed. 30 tablet 0   No current facility-administered medications for this visit.    Allergies as of 02/14/2024   (No Known Allergies)    Family History  Problem Relation Age of Onset   Diabetes Father     Social History   Socioeconomic History   Marital status: Single    Spouse name: Not on file   Number of children: Not on file   Years of education: Not on file   Highest education level: Not on file  Occupational History   Not on file  Tobacco Use   Smoking status: Never   Smokeless tobacco: Never  Vaping Use   Vaping status: Never Used  Substance and Sexual Activity   Alcohol  use: Yes    Comment: occasionally drinks   Drug use: Not Currently   Sexual activity: Not on file  Other Topics Concern   Not on file  Social History Narrative   Not on file   Social Drivers of Health   Tobacco Use: Low Risk (12/26/2023)   Patient History    Smoking Tobacco Use: Never    Smokeless Tobacco Use: Never    Passive Exposure: Not on file  Recent Concern: Tobacco Use - Medium Risk (10/29/2023)   Patient History  Smoking Tobacco Use: Former    Smokeless Tobacco Use: Never    Passive Exposure: Not on Actuary Strain: Not on file  Food Insecurity: No Food Insecurity (08/18/2022)   Hunger Vital Sign    Worried About Running Out of Food in the Last Year: Never true    Ran Out of Food in the Last Year: Never true  Transportation Needs: No Transportation Needs (08/18/2022)   PRAPARE - Administrator, Civil Service (Medical): No    Lack of Transportation (Non-Medical): No  Physical Activity: Not on file  Stress: Not on file  Social Connections: Not on file  Intimate Partner  Violence: Not At Risk (08/18/2022)   Humiliation, Afraid, Rape, and Kick questionnaire    Fear of Current or Ex-Partner: No    Emotionally Abused: No    Physically Abused: No    Sexually Abused: No  Depression (PHQ2-9): Not on file  Alcohol  Screen: Not on file  Housing: Low Risk (08/18/2022)   Housing    Last Housing Risk Score: 0  Utilities: Not At Risk (08/18/2022)   [employer]HC Utilities    Threatened with loss of utilities: No  Health Literacy: Not on file     RELEVANT GI HISTORY, IMAGING AND LABS: CBC    Component Value Date/Time   WBC 4.0 12/26/2023 1121   RBC 3.96 (L) 12/26/2023 1121   HGB 12.6 (L) 12/26/2023 1121   HGB 14.0 10/25/2023 1618   HCT 35.9 (L) 12/26/2023 1121   HCT 43.1 10/25/2023 1618   PLT 86 (L) 12/26/2023 1121   PLT 107 (L) 10/25/2023 1618   MCV 90.7 12/26/2023 1121   MCV 96 10/25/2023 1618   MCH 31.8 12/26/2023 1121   MCHC 35.1 12/26/2023 1121   RDW 15.7 (H) 12/26/2023 1121   RDW 15.9 (H) 10/25/2023 1618   LYMPHSABS 2.3 12/26/2023 1121   LYMPHSABS 3.0 10/25/2023 1618   MONOABS 0.6 12/26/2023 1121   EOSABS 0.1 12/26/2023 1121   EOSABS 0.1 10/25/2023 1618   BASOSABS 0.0 12/26/2023 1121   BASOSABS 0.1 10/25/2023 1618   Recent Labs    10/25/23 1618 12/26/23 1121  HGB 14.0 12.6*    CMP     Component Value Date/Time   NA 136 12/26/2023 1121   NA 135 10/19/2021 1605   K 4.4 12/26/2023 1121   CL 105 12/26/2023 1121   CO2 23 12/26/2023 1121   GLUCOSE 90 12/26/2023 1121   BUN 9 12/26/2023 1121   BUN 13 10/19/2021 1605   CREATININE 0.78 12/26/2023 1121   CALCIUM 9.1 12/26/2023 1121   PROT 6.2 (L) 12/26/2023 1121   PROT 7.7 10/19/2021 1605   ALBUMIN  3.3 (L) 12/26/2023 1121   ALBUMIN  2.5 (L) 10/19/2021 1605   AST 87 (H) 12/26/2023 1121   ALT 48 (H) 12/26/2023 1121   ALKPHOS 130 (H) 12/26/2023 1121   BILITOT 1.4 (H) 12/26/2023 1121   BILITOT 5.5 (H) 10/19/2021 1605   GFRNONAA >60 12/26/2023 1121      Latest Ref Rng & Units 12/26/2023   11:21  AM 08/21/2022    8:08 AM 08/20/2022    3:49 AM  Hepatic Function  Total Protein 6.5 - 8.1 g/dL 6.2  5.1  5.7   Albumin  3.5 - 5.0 g/dL 3.3  1.9  2.1   AST 15 - 41 U/L 87  126  146   ALT 0 - 44 U/L 48  38  44   Alk Phosphatase 38 - 126 U/L 130  100  85   Total Bilirubin 0.0 - 1.2 mg/dL 1.4  5.9  6.2       Review of Systems   All systems reviewed and negative except where noted in HPI.    Physical Exam  There were no vitals taken for this visit. No LMP for male patient. General:   Alert and oriented. Pleasant and cooperative. Well-nourished and well-developed. In no acute distress.  Head:  Normocephalic and atraumatic. Eyes:  Without icterus Ears:  Normal auditory acuity. Neck:  Supple; no masses or thyromegaly. Lungs:  Respirations even and unlabored.  Clear throughout to auscultation.   No wheezes, crackles, or rhonchi. No acute distress. Heart:  Regular rate and rhythm; no murmurs, clicks, rubs, or gallops. Abdomen:  Normal bowel sounds.  No bruits.  Soft, non-tender and non-distended without masses, hepatosplenomegaly or hernias noted.  No guarding or rebound tenderness.  ***Negative Carnett sign.   Rectal:  Deferred.***  Msk:  Symmetrical without gross deformities. Normal posture. Extremities:  Without edema. Neurologic:  Alert and  oriented x4;  grossly normal neurologically. Skin:  Intact without significant lesions or rashes. Psych:  Alert and cooperative. Normal mood and affect.   Assessment & Plan   Andrew Cummings is a 44 y.o. male presenting today with     [name] Bohr, DNP, AGNP-C [facility]   "

## 2024-02-14 ENCOUNTER — Ambulatory Visit: Admitting: Family Medicine
